# Patient Record
Sex: Male | Born: 1955
Health system: Southern US, Community
[De-identification: ages and names within clinical notes are randomized; demographics above are authoritative.]

## PROBLEM LIST (undated history)

## (undated) DIAGNOSIS — I10 Essential (primary) hypertension: Secondary | ICD-10-CM

## (undated) DIAGNOSIS — R911 Solitary pulmonary nodule: Secondary | ICD-10-CM

## (undated) DIAGNOSIS — F329 Major depressive disorder, single episode, unspecified: Secondary | ICD-10-CM

## (undated) DIAGNOSIS — E785 Hyperlipidemia, unspecified: Secondary | ICD-10-CM

## (undated) DIAGNOSIS — F32A Depression, unspecified: Secondary | ICD-10-CM

## (undated) DIAGNOSIS — Z8719 Personal history of other diseases of the digestive system: Secondary | ICD-10-CM

## (undated) DIAGNOSIS — J449 Chronic obstructive pulmonary disease, unspecified: Secondary | ICD-10-CM

## (undated) DIAGNOSIS — M1990 Unspecified osteoarthritis, unspecified site: Secondary | ICD-10-CM

## (undated) DIAGNOSIS — Z8709 Personal history of other diseases of the respiratory system: Secondary | ICD-10-CM

## (undated) DIAGNOSIS — IMO0001 Reserved for inherently not codable concepts without codable children: Secondary | ICD-10-CM

## (undated) HISTORY — PX: LEG SURGERY: SHX1003

## (undated) HISTORY — PX: COLONOSCOPY: SHX174

## (undated) HISTORY — DX: Solitary pulmonary nodule: R91.1

## (undated) HISTORY — DX: Chronic obstructive pulmonary disease, unspecified: J44.9

## (undated) HISTORY — DX: Hyperlipidemia, unspecified: E78.5

## (undated) HISTORY — PX: TOOTH EXTRACTION: SUR596

## (undated) HISTORY — DX: Essential (primary) hypertension: I10

---

## 2006-01-02 ENCOUNTER — Emergency Department (HOSPITAL_COMMUNITY): Admission: EM | Admit: 2006-01-02 | Discharge: 2006-01-03 | Payer: Self-pay | Admitting: Emergency Medicine

## 2013-09-11 ENCOUNTER — Encounter (HOSPITAL_COMMUNITY): Payer: Self-pay | Admitting: Emergency Medicine

## 2013-09-11 ENCOUNTER — Emergency Department (HOSPITAL_COMMUNITY)
Admission: EM | Admit: 2013-09-11 | Discharge: 2013-09-11 | Disposition: A | Discharge status: Home / Self Care | Payer: Medicare HMO | Attending: Emergency Medicine | Admitting: Emergency Medicine

## 2013-09-11 DIAGNOSIS — K625 Hemorrhage of anus and rectum: Secondary | ICD-10-CM | POA: Insufficient documentation

## 2013-09-11 DIAGNOSIS — I10 Essential (primary) hypertension: Secondary | ICD-10-CM | POA: Insufficient documentation

## 2013-09-11 DIAGNOSIS — K5900 Constipation, unspecified: Secondary | ICD-10-CM | POA: Insufficient documentation

## 2013-09-11 LAB — COMPREHENSIVE METABOLIC PANEL
ALT: 17 U/L (ref 0–53)
AST: 20 U/L (ref 0–37)
Albumin: 3.5 g/dL (ref 3.5–5.2)
Alkaline Phosphatase: 105 U/L (ref 39–117)
BUN: 19 mg/dL (ref 6–23)
CO2: 28 mEq/L (ref 19–32)
Calcium: 9 mg/dL (ref 8.4–10.5)
Chloride: 97 mEq/L (ref 96–112)
Creatinine, Ser: 0.81 mg/dL (ref 0.50–1.35)
GFR calc Af Amer: >90 mL/min (ref >90)
GFR calc non Af Amer: >90 mL/min (ref >90)
Glucose, Bld: 115 mg/dL — ABNORMAL HIGH (ref 70–99)
Potassium: 4.4 mEq/L (ref 3.7–5.3)
Sodium: 135 mEq/L — ABNORMAL LOW (ref 137–147)
Total Bilirubin: 0.2 mg/dL — ABNORMAL LOW (ref 0.3–1.2)
Total Protein: 6.9 g/dL (ref 6.0–8.3)

## 2013-09-11 LAB — CBC WITH DIFFERENTIAL/PLATELET
Basophils Absolute: 0 10*3/uL (ref 0.0–0.1)
Basophils Relative: 0 % (ref 0–1)
Eosinophils Absolute: 0.4 10*3/uL (ref 0.0–0.7)
Eosinophils Relative: 3 % (ref 0–5)
HCT: 41.3 % (ref 39.0–52.0)
Hemoglobin: 14.4 g/dL (ref 13.0–17.0)
Lymphocytes Relative: 19 % (ref 12–46)
Lymphs Abs: 2.2 10*3/uL (ref 0.7–4.0)
MCH: 32.4 pg (ref 26.0–34.0)
MCHC: 34.9 g/dL (ref 30.0–36.0)
MCV: 92.8 fL (ref 78.0–100.0)
Monocytes Absolute: 1 10*3/uL (ref 0.1–1.0)
Monocytes Relative: 9 % (ref 3–12)
Neutro Abs: 7.8 10*3/uL — ABNORMAL HIGH (ref 1.7–7.7)
Neutrophils Relative %: 69 % (ref 43–77)
Platelets: 332 10*3/uL (ref 150–400)
RBC: 4.45 MIL/uL (ref 4.22–5.81)
RDW: 12.3 % (ref 11.5–15.5)
WBC: 11.3 10*3/uL — ABNORMAL HIGH (ref 4.0–10.5)

## 2013-09-11 MED ORDER — PANTOPRAZOLE SODIUM 40 MG PO TBEC
40.0000 mg | DELAYED_RELEASE_TABLET | Freq: Once | ORAL | Status: AC
  Administered 2013-09-11: 40 mg via ORAL
  Filled 2013-09-11: qty 1

## 2013-09-11 MED ORDER — PANTOPRAZOLE SODIUM 40 MG PO TBEC: 40.0000 mg | DELAYED_RELEASE_TABLET | Freq: Every day | ORAL | Status: AC

## 2013-09-11 MED ORDER — SODIUM CHLORIDE 0.9 % IV BOLUS (SEPSIS)
1000.0000 mL | Freq: Once | INTRAVENOUS | Status: AC
  Administered 2013-09-11: 1000 mL via INTRAVENOUS

## 2013-09-11 NOTE — Discharge Instructions (Signed)
Stop your aleve and aspirin and follow up with Dr. Oneida Alar in 1-2 weeks

## 2013-09-11 NOTE — ED Notes (Signed)
Pt reports "knots in my stomach" x1 week and intermittent diarrhea. Pt states he's had "some dark stools" since Tuesday. Pt denies weakness, lightheadedness.

## 2013-09-11 NOTE — ED Provider Notes (Signed)
CSN: [id number]     Arrival date & time 09/11/13  1705 History  This chart was scribed for Maudry Diego, MD by Randa Evens, ED Scribe. This patient was seen in room APA11/APA11 and the patient's care was started at 6:04 PM.    Chief Complaint  Patient presents with  . Rectal Bleeding    Patient is a 58 y.o. male presenting with hematochezia. The history is provided by the patient. No language interpreter was used.  Rectal Bleeding Quality:  Black and tarry Amount:  Unable to specify Duration:  1 week Timing:  Intermittent Progression:  Worsening Chronicity:  New Context: constipation and hemorrhoids   Similar prior episodes: no   Relieved by:  None tried Worsened by:  Nothing tried Ineffective treatments:  None tried Associated symptoms: no abdominal pain    HPI Comments: John Bridges is a 58 y.o. male who presents to the Emergency Department complaining of blood in stool since last Friday. Pt complains of associated abdominal pain and constipation throughout the weekend. Pt states when he was able to have bowel movement his stool was black and tarry. Pt also complains his stomach being "harder than normal".  Pt also takes an apirin per day. Pt also denies any other symptoms or pain. He states that he has a prior history of hemorrhoids.    Past Medical History  Diagnosis Date  . Hypertension    History reviewed. No pertinent past surgical history. No family history on file. History  Substance Use Topics  . Smoking status: Not on file  . Smokeless tobacco: Not on file  . Alcohol Use: Not on file    Review of Systems  Constitutional: Negative for appetite change and fatigue.  HENT: Negative for congestion, ear discharge and sinus pressure.   Eyes: Negative for discharge.  Respiratory: Negative for cough.   Cardiovascular: Negative for chest pain.  Gastrointestinal: Positive for constipation, blood in stool and hematochezia. Negative for abdominal pain and diarrhea.   Genitourinary: Negative for frequency and hematuria.  Musculoskeletal: Negative for back pain.  Skin: Negative for rash.  Neurological: Negative for seizures and headaches.  Psychiatric/Behavioral: Negative for hallucinations.    Allergies  Review of patient's allergies indicates no known allergies.  Home Medications   Prior to Admission medications   Not on File   Triage Vitals: BP 156/88  Pulse 94  Temp(Src) 98 F (36.7 C) (Oral)  Resp 20  Ht 5\' 9"  (1.753 m)  Wt 190 lb (86.183 kg)  BMI 28.05 kg/m2  SpO2 96%  Physical Exam  Nursing note and vitals reviewed. Constitutional: He is oriented to person, place, and time. He appears well-developed.  HENT:  Head: Normocephalic.  Eyes: Conjunctivae and EOM are normal. No scleral icterus.  Neck: Neck supple. No thyromegaly present.  Cardiovascular: Normal rate and regular rhythm.  Exam reveals no gallop and no friction rub.   No murmur heard. Pulmonary/Chest: No stridor. He has no wheezes. He has no rales. He exhibits no tenderness.  Abdominal: He exhibits no distension. There is no tenderness. There is no rebound.  Genitourinary:  Dark brown heme positive stool  Musculoskeletal: Normal range of motion. He exhibits no edema.  Lymphadenopathy:    He has no cervical adenopathy.  Neurological: He is oriented to person, place, and time. He exhibits normal muscle tone. Coordination normal.  Skin: No rash noted. No erythema.  Psychiatric: He has a normal mood and affect. His behavior is normal.    ED Course  Procedures (including critical care time) DIAGNOSTIC STUDIES: Oxygen Saturation is 96% on RA, adequate by my interpretation.    COORDINATION OF CARE: 6:08 PM-Discussed treatment plan which includes CBC panel, CMP, medications and rectal exam with pt at bedside and pt agreed to plan.   6:20 PM- Performed rectal exam.  Labs Review Labs Reviewed  CBC WITH DIFFERENTIAL - Abnormal; Notable for the following:    WBC 11.3  (*)    Neutro Abs 7.8 (*)    All other components within normal limits  COMPREHENSIVE METABOLIC PANEL - Abnormal; Notable for the following:    Sodium 135 (*)    Glucose, Bld 115 (*)    Total Bilirubin 0.2 (*)    All other components within normal limits    Imaging Review No results found.   EKG Interpretation None      MDM   Final diagnoses:  None   The chart was scribed for me under my direct supervision.  I personally performed the history, physical, and medical decision making and all procedures in the evaluation of this patient.Maudry Diego, MD 09/11/13 [phone]

## 2013-09-11 NOTE — ED Notes (Signed)
Pt denies dizziness or lightheadedness when standing for orthostatic VS.

## 2013-09-11 NOTE — ED Notes (Signed)
States he noted bright red blood noted this afternoon

## 2014-12-27 ENCOUNTER — Encounter (HOSPITAL_COMMUNITY): Payer: Self-pay | Admitting: *Deleted

## 2014-12-27 ENCOUNTER — Emergency Department (HOSPITAL_COMMUNITY)
Admission: EM | Admit: 2014-12-27 | Discharge: 2014-12-27 | Disposition: A | Discharge status: Home / Self Care | Payer: Medicare HMO | Attending: Emergency Medicine | Admitting: Emergency Medicine

## 2014-12-27 DIAGNOSIS — Z720 Tobacco use: Secondary | ICD-10-CM | POA: Diagnosis not present

## 2014-12-27 DIAGNOSIS — Z791 Long term (current) use of non-steroidal anti-inflammatories (NSAID): Secondary | ICD-10-CM | POA: Diagnosis not present

## 2014-12-27 DIAGNOSIS — R2241 Localized swelling, mass and lump, right lower limb: Secondary | ICD-10-CM | POA: Diagnosis present

## 2014-12-27 DIAGNOSIS — Z79899 Other long term (current) drug therapy: Secondary | ICD-10-CM | POA: Diagnosis not present

## 2014-12-27 DIAGNOSIS — L089 Local infection of the skin and subcutaneous tissue, unspecified: Secondary | ICD-10-CM

## 2014-12-27 DIAGNOSIS — I10 Essential (primary) hypertension: Secondary | ICD-10-CM | POA: Diagnosis not present

## 2014-12-27 DIAGNOSIS — Z7982 Long term (current) use of aspirin: Secondary | ICD-10-CM | POA: Diagnosis not present

## 2014-12-27 MED ORDER — SULFAMETHOXAZOLE-TRIMETHOPRIM 800-160 MG PO TABS: 1.0000 | ORAL_TABLET | Freq: Two times a day (BID) | ORAL | Status: AC

## 2014-12-27 NOTE — ED Notes (Signed)
Pt reports taking a cholesterol medication, does not know the name.

## 2014-12-27 NOTE — ED Notes (Signed)
Pt seen and evaluated by EDPa for initial assessment. 

## 2014-12-27 NOTE — ED Provider Notes (Signed)
CSN: [id number]     Arrival date & time 12/27/14  1351 History   First MD Initiated Contact with Patient 12/27/14 1401     Chief Complaint  Patient presents with  . Cyst     (Consider location/radiation/quality/duration/timing/severity/associated sxs/prior Treatment) HPI Comments: Pt comes in with c/o knot on his right leg for 1 week. States that the area is sore to touch. He has had a little bit of bloody drainage. He has noticed some redness around the area. He states that someone told him it might be cancer so he decided to come in today. No fever, numbness or weakness  The history is provided by the patient. No language interpreter was used.    Past Medical History  Diagnosis Date  . Hypertension    History reviewed. No pertinent past surgical history. History reviewed. No pertinent family history. History  Substance Use Topics  . Smoking status: Current Every Day Smoker -- 1.00 packs/day    Types: Cigarettes  . Smokeless tobacco: Not on file  . Alcohol Use: No    Review of Systems  All other systems reviewed and are negative.     Allergies  Review of patient's allergies indicates no known allergies.  Home Medications   Prior to Admission medications   Medication Sig Start Date End Date Taking? Authorizing Provider  amLODipine (NORVASC) 10 MG tablet Take 10 mg by mouth daily.    Historical Provider, MD  aspirin EC 81 MG tablet Take 81 mg by mouth daily.    Historical Provider, MD  HYDROcodone-acetaminophen (NORCO) 7.5-325 MG per tablet Take 1 tablet by mouth every 6 (six) hours as needed for moderate pain.    Historical Provider, MD  lisinopril-hydrochlorothiazide (PRINZIDE,ZESTORETIC) 20-12.5 MG per tablet Take 1 tablet by mouth daily.    Historical Provider, MD  naproxen sodium (ALEVE) 220 MG tablet Take 440 mg by mouth daily.    Historical Provider, MD  pantoprazole (PROTONIX) 40 MG tablet Take 1 tablet (40 mg total) by mouth daily. 09/11/13   Milton Ferguson, MD   Pseudoeph-Doxylamine-DM-APAP (NYQUIL PO) Take 15 mLs by mouth at bedtime as needed (Sleep).    Historical Provider, MD   BP 150/87 mmHg  Pulse 97  Temp(Src) 98.6 F (37 C) (Oral)  Resp 16  Ht 5\' 9"  (1.753 m)  Wt 175 lb (79.379 kg)  BMI 25.83 kg/m2  SpO2 98% Physical Exam  Constitutional: He is oriented to person, place, and time. He appears well-developed and well-nourished.  Cardiovascular: Normal rate and regular rhythm.   Pulmonary/Chest: Effort normal and breath sounds normal.  Musculoskeletal: Normal range of motion.  Neurological: He is alert and oriented to person, place, and time.  Skin:  Small red fluctuant area to the right lower leg. Scab noted to the center. No drainage noted a this time  Nursing note and vitals reviewed.   ED Course  Procedures (including critical care time) Labs Review Labs Reviewed - No data to display  Imaging Review No results found.   EKG Interpretation None      MDM   Final diagnoses:  Skin infection    Consistent with infected sebaceous cyst. Pt has follow up with his doctor in the next couple of day. Sent home on bactrim.     Glendell Docker, NP 12/27/14 [place], MD 12/29/14 1252

## 2014-12-27 NOTE — ED Notes (Signed)
Patient reports "knot on leg" x 1 week. Denies injury, denies drainage or fevers.

## 2015-07-06 ENCOUNTER — Telehealth: Payer: Self-pay | Admitting: *Deleted

## 2015-07-06 MED ORDER — HYDROCODONE-ACETAMINOPHEN 7.5-325 MG PO TABS: 1.0000 | ORAL_TABLET | ORAL | Status: DC | PRN

## 2015-07-06 NOTE — Telephone Encounter (Signed)
Patient called requesting hydrocodone 7.5 qty 120 to be refilled. Please advise [phone]

## 2015-07-06 NOTE — Telephone Encounter (Signed)
Prescription available, patient aware  

## 2015-07-06 NOTE — Telephone Encounter (Signed)
Rx printed

## 2015-07-17 ENCOUNTER — Encounter: Payer: Self-pay | Admitting: Internal Medicine

## 2015-07-17 ENCOUNTER — Ambulatory Visit (INDEPENDENT_AMBULATORY_CARE_PROVIDER_SITE_OTHER): Payer: Medicare HMO | Admitting: Internal Medicine

## 2015-07-17 VITALS — BP 140/80 | HR 98 | Ht 68.0 in | Wt 181.0 lb

## 2015-07-17 DIAGNOSIS — Z23 Encounter for immunization: Secondary | ICD-10-CM | POA: Diagnosis not present

## 2015-07-17 DIAGNOSIS — Z129 Encounter for screening for malignant neoplasm, site unspecified: Secondary | ICD-10-CM | POA: Insufficient documentation

## 2015-07-17 DIAGNOSIS — F17210 Nicotine dependence, cigarettes, uncomplicated: Secondary | ICD-10-CM | POA: Diagnosis not present

## 2015-07-17 NOTE — Progress Notes (Signed)
Subjective:    Patient ID: John Bridges, male    DOB: Sep 18, 1955, 60 y.o.   MRN: [id number]  PCP Antionette Fairy, PA-C   HPI  IOV 07/17/2015  Chief Complaint  Patient presents with  . PULMONARY CONSULT    Bronchitis - self referral. Notes improvement - some SOB at times     60 year old male who is the husband of my patient with lupus and pulmonary fibrosis Serita Kyle. He presents for new consult. He is a heavy smoker. One of his brothers has had lung cancer. One of his brother has had coronary artery disease and is having bypass today in [place]. He is disabled from back issues but takes care of his wife. He says that at baseline he does not have any physical problems including shortness of breath or cough or wheezing or chest tightness. But in October 2016 had a viral illness respiratory and after that was significantly short of breath and required prednisone and antibiotics. After which he slowly improved and is back to baseline. He consistently denies having any active symptoms. His wife is significantly concerned and therefore referred him here. He keeps insisting that he is feeling fine.  He is noticed to be on lisinopril.  There is very little outside information available   has a past medical history of Hypertension and Hyperlipidemia.   reports that he has been smoking Cigarettes.  He has a 40 pack-year smoking history. He does not have any smokeless tobacco history on file.  Past Surgical History  Procedure Laterality Date  . No past surgeries      No Known Allergies  There is no immunization history for the selected administration types on file for this patient.  Family History  Problem Relation Age of Onset  . Hypertension Mother   . Hypertension Father   . Cancer Brother      Current outpatient prescriptions:  .  amLODipine (NORVASC) 10 MG tablet, Take 10 mg by mouth daily., Disp: , Rfl:  .  HYDROcodone-acetaminophen (NORCO) 7.5-325 MG  tablet, Take 1 tablet by mouth every 4 (four) hours as needed for moderate pain (Must last 30 days.  Do not drive or operate machinery while taking this medicine.)., Disp: 120 tablet, Rfl: 0 .  lisinopril-hydrochlorothiazide (PRINZIDE,ZESTORETIC) 20-12.5 MG per tablet, Take 1 tablet by mouth daily., Disp: , Rfl:  .  naproxen sodium (ALEVE) 220 MG tablet, Take 440 mg by mouth daily., Disp: , Rfl:  .  pantoprazole (PROTONIX) 40 MG tablet, Take 1 tablet (40 mg total) by mouth daily., Disp: 30 tablet, Rfl: 0 .  pravastatin (PRAVACHOL) 40 MG tablet, Take 40 mg by mouth daily., Disp: , Rfl:  .  tiZANidine (ZANAFLEX) 4 MG tablet, Take 4 mg by mouth every 6 (six) hours as needed for muscle spasms., Disp: , Rfl:      Review of Systems  Constitutional: Negative for fever and unexpected weight change.  HENT: Negative for congestion, dental problem, ear pain, nosebleeds, postnasal drip, rhinorrhea, sinus pressure, sneezing, sore throat and trouble swallowing.   Eyes: Negative for redness and itching.  Respiratory: Positive for shortness of breath. Negative for cough, chest tightness and wheezing.   Cardiovascular: Negative for palpitations and leg swelling.  Gastrointestinal: Negative for nausea and vomiting.  Genitourinary: Negative for dysuria.  Musculoskeletal: Negative for joint swelling.  Skin: Negative for rash.  Neurological: Negative for headaches.  Hematological: Does not bruise/bleed easily.  Psychiatric/Behavioral: Negative for dysphoric mood. The patient is not nervous/anxious.  Objective:   Physical Exam  Constitutional: He is oriented to person, place, and time. He appears well-developed and well-nourished. No distress.  HENT:  Head: Normocephalic and atraumatic.  Right Ear: External ear normal.  Left Ear: External ear normal.  Mouth/Throat: Oropharynx is clear and moist. No oropharyngeal exudate.  Eyes: Conjunctivae and EOM are normal. Pupils are equal, round, and reactive  to light. Right eye exhibits no discharge. Left eye exhibits no discharge. No scleral icterus.  Neck: Normal range of motion. Neck supple. No JVD present. No tracheal deviation present. No thyromegaly present.  Cardiovascular: Normal rate, regular rhythm and intact distal pulses.  Exam reveals no gallop and no friction rub.   No murmur heard. Pulmonary/Chest: Effort normal and breath sounds normal. No respiratory distress. He has no wheezes. He has no rales. He exhibits no tenderness.  Prominent xiphoid process  Abdominal: Soft. Bowel sounds are normal. He exhibits no distension and no mass. There is no tenderness. There is no rebound and no guarding.  Musculoskeletal: Normal range of motion. He exhibits no edema or tenderness.  Lymphadenopathy:    He has no cervical adenopathy.  Neurological: He is alert and oriented to person, place, and time. He has normal reflexes. No cranial nerve deficit. Coordination normal.  Skin: Skin is warm and dry. No rash noted. He is not diaphoretic. No erythema. No pallor.  Prominent hyperemia on the face and upper chest  Psychiatric: He has a normal mood and affect. His behavior is normal. Judgment and thought content normal.  Nursing note and vitals reviewed.   Filed Vitals:   07/17/15 1659  BP: 140/80  Pulse: 98  Height: 5\' 8"  (1.727 m)  Weight: 181 lb (82.101 kg)  SpO2: 96%         Assessment & Plan:     ICD-9-CM ICD-10-CM   1. Smoking greater than 40 pack years 305.1 F17.210 CT CHEST LUNG CA SCREEN LOW DOSE W/O CM     Pulmonary Function Test  2. Need for prophylactic vaccination and inoculation against influenza V04.81 Z23 Flu Vaccine QUAD 36+ mos IM (Fluarix, Quad PF)     CT CHEST LUNG CA SCREEN LOW DOSE W/O CM     Pulmonary Function Test  3. Cancer screening V76.9 Z12.9 CT CHEST LUNG CA SCREEN LOW DOSE W/O CM     Pulmonary Function Test    #lung cancer screening I discussed screening Ct chest for early detection of lung  cancer Explained that in age 97-75 and smoking history, annual low dose CT chest can pick up lung cancer early and has potential to save lives and cure lung cancer This is similar in concept to screening mammogram, colonoscopies and pap smears I explained Ct scan is low dose radiation I explained early lung cancer asymptomatic and only way to  detect is CT  With the real advantage that early lung cancer is curable through radiation or surgery I explained CT superior to CXR I explained that false positives are present and can incur cost and workup like biopsies, additional scan but benefit outweighs risk I recommend one a year   #resp  - Currently asymptomatic but given what sounds like asthma COPD exacerbation and smoking history we will do full pulmonary function test  He is to return for follow-up to review the results of these tests in the next few weeks    Dr. Brand Males, M.D., [facility].C.P Pulmonary and Critical Care Medicine Staff Physician [place] [facility] Pager:  [phone], If no answer or between  15:00h - 7:00h: call [phone]  07/17/2015 5:31 PM

## 2015-07-17 NOTE — Patient Instructions (Signed)
ICD-9-CM ICD-10-CM   1. Smoking greater than 40 pack years 305.1 F17.210   2. Need for prophylactic vaccination and inoculation against influenza V04.81 Z23 Flu Vaccine QUAD 36+ mos IM (Fluarix, Quad PF)  3. Cancer screening V76.9 Z12.9     Do Low dose CT chest wo contrast  Do full PFT   - do both tests at [facility]  Followup  - return to see NP Sarah or Tammy in few weeks to discuss above results

## 2015-07-18 ENCOUNTER — Ambulatory Visit (INDEPENDENT_AMBULATORY_CARE_PROVIDER_SITE_OTHER): Payer: Medicare HMO | Admitting: Orthopaedic Surgery

## 2015-07-18 VITALS — BP 170/92 | HR 87 | Temp 98.2°F | Ht 68.0 in | Wt 180.0 lb

## 2015-07-18 DIAGNOSIS — M25511 Pain in right shoulder: Secondary | ICD-10-CM

## 2015-07-18 NOTE — Progress Notes (Signed)
Patient John Bridges John Bridges, male DOB:02-01-1956, 60 y.o. [id number]  Chief Complaint  Patient presents with  . Follow-up    Right shoulder "feeling a little better just sore"    HPI  CLETIS STEVEN is a 60 y.o. male who has chronic right shoulder pain.  He has no new acute problem, no swelling, no paresthesias.  Shoulder Pain  The pain is present in the right shoulder. This is a chronic problem. The current episode started more than 1 year ago. There has been no history of extremity trauma. The problem occurs intermittently. The problem has been waxing and waning. The quality of the pain is described as aching. The pain is at a severity of 2/10. The pain is mild. He has tried acetaminophen, NSAIDS, oral narcotics and rest for the symptoms. The treatment provided moderate relief.    Body mass index is 27.38 kg/(m^2).   Review of Systems  Constitutional:       Patient does not have Diabetes Mellitus. Patient has hypertension. Patient has COPD or shortness of breath. Patient does not have BMI > 35. Patient has current smoking history.  Musculoskeletal: Positive for myalgias and arthralgias.    Past Medical History  Diagnosis Date  . Hypertension   . Hyperlipidemia     Past Surgical History  Procedure Laterality Date  . No past surgeries      Family History  Problem Relation Age of Onset  . Hypertension Mother   . Hypertension Father   . Cancer Brother     Social History Social History  Substance Use Topics  . Smoking status: Current Every Day Smoker -- 1.00 packs/day for 40 years    Types: Cigarettes  . Smokeless tobacco: Not on file  . Alcohol Use: 0.0 oz/week    0 Standard drinks or equivalent per week     Comment: occasional beer    No Known Allergies  Current Outpatient Prescriptions  Medication Sig Dispense Refill  . amLODipine (NORVASC) 10 MG tablet Take 10 mg by mouth daily.    Marland Kitchen HYDROcodone-acetaminophen (NORCO) 7.5-325 MG tablet Take 1 tablet by  mouth every 4 (four) hours as needed for moderate pain (Must last 30 days.  Do not drive or operate machinery while taking this medicine.). 120 tablet 0  . lisinopril-hydrochlorothiazide (PRINZIDE,ZESTORETIC) 20-12.5 MG per tablet Take 1 tablet by mouth daily.    . naproxen sodium (ALEVE) 220 MG tablet Take 440 mg by mouth daily.    . pantoprazole (PROTONIX) 40 MG tablet Take 1 tablet (40 mg total) by mouth daily. 30 tablet 0  . pravastatin (PRAVACHOL) 40 MG tablet Take 40 mg by mouth daily.    Marland Kitchen tiZANidine (ZANAFLEX) 4 MG tablet Take 4 mg by mouth every 6 (six) hours as needed for muscle spasms.     No current facility-administered medications for this visit.     Physical Exam  Blood pressure 170/92, pulse 87, temperature 98.2 F (36.8 C), height 5\' 8"  (1.727 m), weight 180 lb (81.647 kg).  Constitutional: overall normal hygiene, normal nutrition, well developed, normal grooming, normal body habitus. Assistive device:none  Musculoskeletal: gait and station Limp none, muscle tone and strength are normal, no tremors or atrophy is present.  .  Neurological: coordination overall normal.  Deep tendon reflex/nerve stretch intact.  Sensation normal.  Cranial nerves II-XII intact.   Skin:   normal overall no scars, lesions, ulcers or rashes. No psoriasis.  Psychiatric: Alert and oriented x 3.  Recent memory intact, remote memory unclear.  Normal mood and affect. Well groomed.  Good eye contact.  Cardiovascular: overall no swelling, no varicosities, no edema bilaterally, normal temperatures of the legs and arms, no clubbing, cyanosis and good capillary refill.  Lymphatic: palpation is normal.  Examination of right Upper Extremity is done.  Inspection:   Overall:  Elbow non-tender without crepitus or defects, forearm non-tender without crepitus or defects, wrist non-tender without crepitus or defects, hand non-tender.    Shoulder: with glenohumeral joint tenderness, without  effusion.   Upper arm: without swelling and tenderness   Range of motion:   Overall:  Full range of motion of the elbow, full range of motion of wrist and full range of motion in fingers.   Shoulder:  right  160 degrees forward flexion; 140 degrees abduction; 30 degrees internal rotation, 30 degrees external rotation, 20 degrees extension, 40 degrees adduction.   Stability:   Overall:  Shoulder, elbow and wrist stable   Strength and Tone:   Overall full shoulder muscles strength, full upper arm strength and normal upper arm bulk and tone.   The patient has been educated about the nature of the problem(s) and counseled on treatment options.  The patient appeared to understand what I have discussed and is in agreement with it.  PLAN Call if any problems.  Precautions discussed.  Continue current medications.   Return to clinic 3 months

## 2015-08-02 ENCOUNTER — Ambulatory Visit (HOSPITAL_COMMUNITY)
Admission: RE | Admit: 2015-08-02 | Discharge: 2015-08-02 | Disposition: A | Discharge status: Home / Self Care | Payer: Medicare HMO | Source: Ambulatory Visit | Attending: Internal Medicine | Admitting: Internal Medicine

## 2015-08-02 ENCOUNTER — Telehealth: Payer: Self-pay | Admitting: Internal Medicine

## 2015-08-02 ENCOUNTER — Telehealth: Payer: Self-pay | Admitting: Orthopaedic Surgery

## 2015-08-02 ENCOUNTER — Other Ambulatory Visit: Payer: Self-pay | Admitting: *Deleted

## 2015-08-02 DIAGNOSIS — F17210 Nicotine dependence, cigarettes, uncomplicated: Secondary | ICD-10-CM

## 2015-08-02 DIAGNOSIS — I700 Atherosclerosis of aorta: Secondary | ICD-10-CM | POA: Insufficient documentation

## 2015-08-02 DIAGNOSIS — Z129 Encounter for screening for malignant neoplasm, site unspecified: Secondary | ICD-10-CM

## 2015-08-02 DIAGNOSIS — I2510 Atherosclerotic heart disease of native coronary artery without angina pectoris: Secondary | ICD-10-CM | POA: Diagnosis not present

## 2015-08-02 DIAGNOSIS — J439 Emphysema, unspecified: Secondary | ICD-10-CM | POA: Insufficient documentation

## 2015-08-02 DIAGNOSIS — Z23 Encounter for immunization: Secondary | ICD-10-CM

## 2015-08-02 DIAGNOSIS — R911 Solitary pulmonary nodule: Secondary | ICD-10-CM

## 2015-08-02 DIAGNOSIS — R918 Other nonspecific abnormal finding of lung field: Secondary | ICD-10-CM | POA: Insufficient documentation

## 2015-08-02 LAB — PULMONARY FUNCTION TEST
DL/VA % pred: 84 %
DL/VA: 3.82 ml/min/mmHg/L
DLCO unc % pred: 77 %
DLCO unc: 22.86 ml/min/mmHg
FEF 25-75 Post: 1.15 L/sec
FEF 25-75 Pre: 0.92 L/sec
FEF2575-%Change-Post: 24 %
FEF2575-%Pred-Post: 40 %
FEF2575-%Pred-Pre: 32 %
FEV1-%Change-Post: 10 %
FEV1-%Pred-Post: 64 %
FEV1-%Pred-Pre: 58 %
FEV1-Post: 2.19 L
FEV1-Pre: 1.99 L
FEV1FVC-%Change-Post: 8 %
FEV1FVC-%Pred-Pre: 78 %
FEV6-%Change-Post: 1 %
FEV6-%Pred-Post: 78 %
FEV6-%Pred-Pre: 77 %
FEV6-Post: 3.34 L
FEV6-Pre: 3.3 L
FEV6FVC-%Change-Post: 0 %
FEV6FVC-%Pred-Post: 103 %
FEV6FVC-%Pred-Pre: 103 %
FVC-%Change-Post: 2 %
FVC-%Pred-Post: 76 %
FVC-%Pred-Pre: 75 %
FVC-Post: 3.42 L
FVC-Pre: 3.35 L
Post FEV1/FVC ratio: 64 %
Post FEV6/FVC ratio: 99 %
Pre FEV1/FVC ratio: 59 %
Pre FEV6/FVC Ratio: 98 %
RV % pred: 194 %
RV: 4.1 L
TLC % pred: 110 %
TLC: 7.29 L

## 2015-08-02 MED ORDER — HYDROCODONE-ACETAMINOPHEN 7.5-325 MG PO TABS: 1.0000 | ORAL_TABLET | ORAL | Status: DC | PRN

## 2015-08-02 MED ORDER — ALBUTEROL SULFATE (2.5 MG/3ML) 0.083% IN NEBU
2.5000 mg | INHALATION_SOLUTION | Freq: Once | RESPIRATORY_TRACT | Status: AC
  Administered 2015-08-02: 2.5 mg via RESPIRATORY_TRACT

## 2015-08-02 NOTE — Telephone Encounter (Addendum)
LEt [place] Mote know that pft shows moderate copd.   Also CT today shows nodule 13.31mm - plest get PET scan    If he can do PET befoe he sees APP would be helpful  Changing note from [facility] to Triage  THanks  Dr. Brand Males, M.D., [facility]C.P Pulmonary and Critical Care Medicine Staff Physician [place] [facility] Pager: [phone], If no answer or between  15:00h - 7:00h: call [phone]  08/03/2015 12:27 PM     Ct Chest Lung Ca Screen Low Dose W/o Cm  08/03/2015  CLINICAL DATA:  60 year old male current smoker with 45 pack-year history of smoking, for initial lung cancer screening EXAM: CT CHEST WITHOUT CONTRAST LOW-DOSE FOR LUNG CANCER SCREENING TECHNIQUE: Multidetector CT imaging of the chest was performed following the standard protocol without IV contrast. COMPARISON:  None. FINDINGS: Mediastinum/Nodes: Heart is normal in size. No pericardial effusion. Coronary atherosclerosis involving the LAD and right coronary artery. Atherosclerotic calcifications of the aortic arch. Small mediastinal lymph nodes which do not meet pathologic CT size criteria, including a 7 mm short axis low right paratracheal node (series 2/ image 26) and a 7 mm short axis subcarinal node (series 2/image 30). While prominent nodal soft tissue is suspected in the right hilar region (series 2/ image 32), this is poorly evaluated in the absence of intravenous contrast. Visualized thyroid is unremarkable. Lungs/Pleura: Irregular nodular opacity in the posterior right upper lobe (series 3/image 60), volumetric mean approximately 13.8 mm, with extension to the pleural surface (series 3/image 60). While unusual apical pleural-parenchymal scarring is possible, neoplasm is not excluded. By size criteria, this reflects a Lung Rads 4A lesion. The lungs are otherwise clear. Mild centrilobular and paraseptal emphysematous changes, upper lobe predominant. No focal consolidation. No  pleural effusion or pneumothorax. Upper abdomen: Visualized upper abdomen is unremarkable. Musculoskeletal: Visualized osseous structures are within normal limits. IMPRESSION: Irregular nodular opacity in the posterior right upper lobe, volumetric mean approximately 13.8 mm, with extension to the pleural surface. While unusual apical pleural-parenchymal scarring is possible, neoplasm is not excluded. By size criteria, this reflects a Lung Rads 4A lesion. Lung-RADS Category 4A, suspicious. Follow up low-dose chest CT without contrast in 3 months (please use the following order, "CT CHEST LCS NODULE FOLLOW-UP W/O CM") is recommended. Alternatively, PET may be considered. Electronically Signed   By: Julian Hy M.D.   On: 08/03/2015 11:29

## 2015-08-02 NOTE — Telephone Encounter (Signed)
Patient requesting refill of Hydrocodone  7.5/325mg   Qty 120 Tablets  1 Tablet every 4 hours as needed for moderate pain

## 2015-08-03 NOTE — Telephone Encounter (Signed)
Called and spoke with pt's wife. Reviewed results and recs. She voiced understanding and had no further questions. PET scan order placed. Nothing further needed.

## 2015-08-14 ENCOUNTER — Ambulatory Visit (HOSPITAL_COMMUNITY)
Admission: RE | Admit: 2015-08-14 | Discharge: 2015-08-14 | Disposition: A | Discharge status: Home / Self Care | Payer: Medicare HMO | Source: Ambulatory Visit | Attending: Internal Medicine | Admitting: Internal Medicine

## 2015-08-14 ENCOUNTER — Telehealth: Payer: Self-pay | Admitting: Internal Medicine

## 2015-08-14 DIAGNOSIS — J439 Emphysema, unspecified: Secondary | ICD-10-CM | POA: Insufficient documentation

## 2015-08-14 DIAGNOSIS — R911 Solitary pulmonary nodule: Secondary | ICD-10-CM

## 2015-08-14 DIAGNOSIS — R918 Other nonspecific abnormal finding of lung field: Secondary | ICD-10-CM | POA: Insufficient documentation

## 2015-08-14 LAB — GLUCOSE, CAPILLARY: Glucose-Capillary: 116 mg/dL — ABNORMAL HIGH (ref 65–99)

## 2015-08-14 MED ORDER — FLUDEOXYGLUCOSE F - 18 (FDG) INJECTION
8.9900 | Freq: Once | INTRAVENOUS | Status: AC | PRN
  Administered 2015-08-14: 8.99 via INTRAVENOUS

## 2015-08-14 NOTE — Telephone Encounter (Signed)
Spoke with pt's wife. Pt has been scheduled to see TP on 08/18/15 at 3:45pm. She wanted to schedule on this day because she has an appointment that day as well.

## 2015-08-14 NOTE — Telephone Encounter (Signed)
\   Called wife Ulysses Tansil and patient John Bridges - gave results. Pls give him office appt next week to see me to discuss results or if not, see TP. Let me know date. Wife crying on phone and very upset. SO get hm to see me asap    Nm Pet Image Initial (pi) Skull Base To Thigh  08/14/2015  CLINICAL DATA:  Initial treatment strategy for right upper lobe nodule. EXAM: NUCLEAR MEDICINE PET SKULL BASE TO THIGH TECHNIQUE: 9.0 mCi F-18 FDG was injected intravenously. Full-ring PET imaging was performed from the skull base to thigh after the radiotracer. CT data was obtained and used for attenuation correction and anatomic localization. FASTING BLOOD GLUCOSE:  Value: 116 mg/dl COMPARISON:  Low-dose chest CT on 08/02/2015 FINDINGS: FINDINGS NECK No hypermetabolic lymph nodes in the neck. CHEST No hypermetabolic mediastinal or hilar nodes. An irregular solid appearing nodular density is seen in the posterior right lung apex measuring 13 mm on image 13 of series 6. This shows metabolic activity with SUV max of 3.1. No other suspicious pulmonary nodules identified. Mild emphysema noted. No evidence pleural effusion. ABDOMEN/PELVIS No abnormal hypermetabolic activity within the liver, pancreas, adrenal glands, or spleen. No hypermetabolic lymph nodes in the abdomen or pelvis. SKELETON No focal hypermetabolic activity to suggest skeletal metastasis. IMPRESSION: 13 mm irregular solid nodular density in posterior right upper lobe apex is hypermetabolic with SUV max of 3.1. Early low-grade bronchogenic carcinoma cannot be excluded. No evidence of thoracic nodal or distant metastatic disease. Mild emphysema. Electronically Signed   By: Earle Gell M.D.   On: 08/14/2015 13:46

## 2015-08-14 NOTE — Telephone Encounter (Signed)
John Bridges  You are seeing John Bridges 08/18/15 _Fev1 > 2L. PEt HOT RUL nodule. New - I think needs referral to [facility]   Thanks  Dr. Brand Males, M.D., [facility].C.P Pulmonary and Critical Care Medicine Staff Physician [facility] Pager: [phone], If no answer or between  15:00h - 7:00h: call [phone]  08/14/2015 4:47 PM

## 2015-08-14 NOTE — Telephone Encounter (Signed)
Called and spoke to pt's wife, Carlyon Shadow. Carlyon Shadow is requesting the results of the PET scan for pt. Pt had PET scan today 08/14/2015.   MR please advise. Thanks.

## 2015-08-18 ENCOUNTER — Ambulatory Visit: Payer: Medicare HMO | Admitting: Adult Health

## 2015-08-21 ENCOUNTER — Ambulatory Visit (INDEPENDENT_AMBULATORY_CARE_PROVIDER_SITE_OTHER): Payer: Medicare HMO | Admitting: Adult Health

## 2015-08-21 ENCOUNTER — Encounter: Payer: Self-pay | Admitting: Adult Health

## 2015-08-21 VITALS — BP 144/82 | HR 78 | Temp 98.5°F | Ht 68.0 in | Wt 184.0 lb

## 2015-08-21 DIAGNOSIS — R911 Solitary pulmonary nodule: Secondary | ICD-10-CM | POA: Diagnosis not present

## 2015-08-21 DIAGNOSIS — J449 Chronic obstructive pulmonary disease, unspecified: Secondary | ICD-10-CM | POA: Diagnosis not present

## 2015-08-21 NOTE — Assessment & Plan Note (Signed)
Moderate COPD -essentially asymptomatic   Plan  May use your albuterol inhaler 2 puffs every 4hrs as needed -this is your rescue inhaler .  So glad you have quit smoking.  Follow up Dr. Chase Caller in 3 months and As needed

## 2015-08-21 NOTE — Progress Notes (Signed)
Subjective:    Patient ID: John Bridges, male    DOB: 1955/09/24, 60 y.o.   MRN: [id number]  HPI 60 yo male former smoker (07/2015 ) seen for pulmonary consult for bronchitis     08/21/2015 Follow up : CT /PET results.  Pt returns for 1 month follow up . Seen last ov for slow to resolve bronchitis .  He was improved by the time of the consult.  He was set up for PFT done on 08/02/15 FEV1 64%, ratio 64, FVC 76%, DLCO 77%.  He was referred for CT chest low dose screening  That showed a 63mm RUL nodule .  He was sent for PET scan that showed a hypermetabolic RUL nodule . No evidence of distant dz.  We discussed his results. He says he quit smoking 5 days ago. Has minimal cough or dyspnea.  Uses albuterol inhaler rarely.  Denies chest pain, orthopnea, , edema , hemoptysis, fever or weight loss.    Past Medical History  Diagnosis Date  . Hypertension   . Hyperlipidemia    Current Outpatient Prescriptions on File Prior to Visit  Medication Sig Dispense Refill  . amLODipine (NORVASC) 10 MG tablet Take 10 mg by mouth daily.    Marland Kitchen HYDROcodone-acetaminophen (NORCO) 7.5-325 MG tablet Take 1 tablet by mouth every 4 (four) hours as needed for moderate pain (Must last 30 days.  Do not drive or operate machinery while taking this medicine.). 120 tablet 0  . lisinopril-hydrochlorothiazide (PRINZIDE,ZESTORETIC) 20-12.5 MG per tablet Take 1 tablet by mouth daily.    . naproxen sodium (ALEVE) 220 MG tablet Take 440 mg by mouth daily.    . pantoprazole (PROTONIX) 40 MG tablet Take 1 tablet (40 mg total) by mouth daily. 30 tablet 0  . pravastatin (PRAVACHOL) 40 MG tablet Take 40 mg by mouth daily.    Marland Kitchen tiZANidine (ZANAFLEX) 4 MG tablet Take 4 mg by mouth every 6 (six) hours as needed for muscle spasms.     No current facility-administered medications on file prior to visit.     Review of Systems Constitutional:   No  weight loss, night sweats,  Fevers, chills, fatigue, or  lassitude.  HEENT:   No  headaches,  Difficulty swallowing,  Tooth/dental problems, or  Sore throat,                No sneezing, itching, ear ache, nasal congestion, post nasal drip,   CV:  No chest pain,  Orthopnea, PND, swelling in lower extremities, anasarca, dizziness, palpitations, syncope.   GI  No heartburn, indigestion, abdominal pain, nausea, vomiting, diarrhea, change in bowel habits, loss of appetite, bloody stools.   Resp: No shortness of breath with exertion or at rest.  No excess mucus, no productive cough,  No non-productive cough,  No coughing up of blood.  No change in color of mucus.  No wheezing.  No chest wall deformity  Skin: no rash or lesions.  GU: no dysuria, change in color of urine, no urgency or frequency.  No flank pain, no hematuria   MS:  No joint pain or swelling.  No decreased range of motion.  No back pain.  Psych:  No change in mood or affect. No depression or anxiety.  No memory loss.         Objective:   Physical Exam Filed Vitals:   08/21/15 1511  BP: 144/82  Pulse: 78  Temp: 98.5 F (36.9 C)  TempSrc: Oral  Height: 5\' 8"  (  1.727 m)  Weight: 184 lb (83.462 kg)  SpO2: 95%   GEN: A/Ox3; pleasant , NAD   HEENT:  /AT,  EACs-clear, TMs-wnl, NOSE-clear, THROAT-clear, no lesions, no postnasal drip or exudate noted.   NECK:  Supple w/ fair ROM; no JVD; normal carotid impulses w/o bruits; no thyromegaly or nodules palpated; no lymphadenopathy.  RESP  Clear  P & A; w/o, wheezes/ rales/ or rhonchi.no accessory muscle use, no dullness to percussion  CARD:  RRR, no m/r/g  , no peripheral edema, pulses intact, no cyanosis or clubbing.  GI:   Soft & nt; nml bowel sounds; no organomegaly or masses detected.  Musco: Warm bil, no deformities or joint swelling noted.   Neuro: alert, no focal deficits noted.    Skin: Warm, no lesions or rashes  Joplin Canty NP-C  [facility]  08/21/2015         Assessment & Plan:

## 2015-08-21 NOTE — Patient Instructions (Signed)
May use your albuterol inhaler 2 puffs every 4hrs as needed -this is your rescue inhaler .  So glad you have quit smoking.  We are referring you to surgeon to look at lung nodule.  Follow up Dr. Chase Caller in 3 months and As needed

## 2015-08-21 NOTE — Assessment & Plan Note (Signed)
Hypermetabolic Lung nodule on PET scan  PFT shows Moderate COPD with FEV1 at 64%.  Will refer to [facility]  So glad you have quit smoking. \ Follow up Dr. Chase Caller in 3 months and As needed

## 2015-08-22 NOTE — Telephone Encounter (Signed)
Ov with Gerhardt on 08/24/15

## 2015-08-24 ENCOUNTER — Encounter: Payer: Self-pay | Admitting: Cardiothoracic Surgery

## 2015-08-24 ENCOUNTER — Institutional Professional Consult (permissible substitution) (INDEPENDENT_AMBULATORY_CARE_PROVIDER_SITE_OTHER): Payer: Medicare HMO | Admitting: Cardiothoracic Surgery

## 2015-08-24 VITALS — BP 153/90 | HR 79 | Resp 16 | Ht 68.0 in | Wt 184.0 lb

## 2015-08-24 DIAGNOSIS — D381 Neoplasm of uncertain behavior of trachea, bronchus and lung: Secondary | ICD-10-CM | POA: Diagnosis not present

## 2015-08-24 NOTE — Patient Instructions (Signed)
Pulmonary Nodule A pulmonary nodule is a small, round growth of tissue in the lung. Pulmonary nodules can range in size from less than 1/5 inch (4 mm) to a little bigger than an inch (25 mm). Most pulmonary nodules are detected when imaging tests of the lung are being performed for a different problem. Pulmonary nodules are usually not cancerous (benign). However, some pulmonary nodules are cancerous (malignant). Follow-up treatment or testing is based on the size of the pulmonary nodule and your risk of getting lung cancer.  CAUSES Benign pulmonary nodules can be caused by various things. Some of the causes include:   Bacterial, fungal, or viral infections. This is usually an old infection that is no longer active, but it can sometimes be a current, active infection.  A benign mass of tissue.  Inflammation from conditions such as rheumatoid arthritis.   Abnormal blood vessels in the lungs. Malignant pulmonary nodules can result from lung cancer or from cancers that spread to the lung from other places in the body. SIGNS AND SYMPTOMS Pulmonary nodules usually do not cause symptoms. DIAGNOSIS Most often, pulmonary nodules are found incidentally when an X-ray or CT scan is performed to look for some other problem in the lung area. To help determine whether a pulmonary nodule is benign or malignant, your health care provider will take a medical history and order a variety of tests. Tests done may include:   Blood tests.  A skin test called a tuberculin test. This test is used to determine if you have been exposed to the germ that causes tuberculosis.   Chest X-rays. If possible, a new X-ray may be compared with X-rays you have had in the past.   CT scan. This test shows smaller pulmonary nodules more clearly than an X-ray.   Positron emission tomography (PET) scan. In this test, a safe amount of a radioactive substance is injected into the bloodstream. Then, the scan takes a picture of  the pulmonary nodule. The radioactive substance is eliminated from your body in your urine.   Biopsy. A tiny piece of the pulmonary nodule is removed so it can be checked under a microscope. TREATMENT  Pulmonary nodules that are benign normally do not require any treatment because they usually do not cause symptoms or breathing problems. Your health care provider may want to monitor the pulmonary nodule through follow-up CT scans. The frequency of these CT scans will vary based on the size of the nodule and the risk factors for lung cancer. For example, CT scans will need to be done more frequently if the pulmonary nodule is larger and if you have a history of smoking and a family history of cancer. Further testing or biopsies may be done if any follow-up CT scan shows that the size of the pulmonary nodule has increased. HOME CARE INSTRUCTIONS  Only take over-the-counter or prescription medicines as directed by your health care provider.  Keep all follow-up appointments with your health care provider. SEEK MEDICAL CARE IF:  You have trouble breathing when you are active.   You feel sick or unusually tired.   You do not feel like eating.   You lose weight without trying to.   You develop chills or night sweats.  SEEK IMMEDIATE MEDICAL CARE IF:  You cannot catch your breath, or you begin wheezing.   You cannot stop coughing.   You cough up blood.   You become dizzy or feel like you are going to pass out.   You   have sudden chest pain.   You have a fever or persistent symptoms for more than 2-3 days.   You have a fever and your symptoms suddenly get worse. MAKE SURE YOU:  Understand these instructions.  Will watch your condition.  Will get help right away if you are not doing well or get worse.   This information is not intended to replace advice given to you by your health care provider. Make sure you discuss any questions you have with your health care  provider.   Document Released: 03/03/2009 Document Revised: 01/06/2013 Document Reviewed: 10/26/2012 Elsevier Interactive Patient Education 2016 [employer]  Lung Cancer Lung cancer occurs when abnormal cells in the lung grow out of control and form a mass (tumor). There are several types of lung cancer. The two most common types are:  Non-small cell. In this type of lung cancer, abnormal cells are larger and grow more slowly than those of small cell lung cancer.  Small cell. In this type of lung cancer, abnormal cells are smaller than those of non-small cell lung cancer. Small cell lung cancer gets worse faster than non-small cell lung cancer. CAUSES  The leading cause of lung cancer is smoking tobacco. The second leading cause is radon exposure. RISK FACTORS  Smoking tobacco.  Exposure to secondhand tobacco smoke.  Exposure to radon gas.  Exposure to asbestos.  Exposure to arsenic in drinking water.  Air pollution.  Family or personal history of lung cancer.  Lung radiation therapy.  Being older than 65 years. SIGNS AND SYMPTOMS  In the early stages, symptoms may not be present. As the cancer progresses, symptoms may include:  A lasting cough, possibly with blood.  Fatigue.  Unexplained weight loss.  Shortness of breath.  Wheezing.  Chest pain.  Loss of appetite. Symptoms of advanced lung cancer include:  Hoarseness.  Bone or joint pain.  Weakness.  Nail problems.  Face or arm swelling.  Paralysis of the face.  Drooping eyelids. DIAGNOSIS  Lung cancer can be identified with a physical exam and with tests such as:  A chest X-ray.  A CT scan.  Blood tests.  A biopsy. After a diagnosis is made, you will have more tests to determine the stage of the cancer. The stages of non-small cell lung cancer are:  Stage 0, also called carcinoma in situ. At this stage, abnormal cells are found in the inner lining of your lung or lungs.  Stage I. At  this stage, abnormal cells have grown into a tumor that is no larger than 5 cm across. The cancer has entered the deeper lung tissue but has not yet entered the lymph nodes or other parts of the body.  Stage II. At this stage, the tumor is 7 cm across or smaller and has entered nearby lymph nodes. Or, the tumor is 5 cm across or smaller and has invaded surrounding tissue but is not found in nearby lymph nodes. There may be more than one tumor present.  Stage III. At this stage, the tumor may be any size. There may be more than one tumor in the lungs. The cancer cells have spread to the lymph nodes and possibly to other organs.  Stage IV. At this stage, there are tumors in both lungs and the cancer has spread to other areas of the body. The stages of small cell lung cancer are:  Limited. At this stage, the cancer is found only on one side of the chest.  Extensive. At this   stage, the cancer is in the lungs and in tissues on the other side of the chest. The cancer has spread to other organs or is found in the fluid between the layers of your lungs. TREATMENT  Depending on the type and stage of your lung cancer, you may be treated with:  Surgery. This is done to remove a tumor.  Radiation therapy. This treatment destroys cancer cells using X-rays or other types of radiation.  Chemotherapy. This treatment uses medicines to destroy cancer cells.  Targeted therapy. This treatment aims to destroy only cancer cells instead of all cells as other therapies do. You may also have a combination of treatments. HOME CARE INSTRUCTIONS   Do not use any tobacco products. This includes cigarettes, chewing tobacco, and electronic cigarettes. If you need help quitting, ask your health care provider.  Take medicines only as directed by your health care provider.  Eat a healthy diet. Work with a dietitian to make sure you are getting the nutrition you need.  Consider joining a support group or seeking  counseling to help you cope with the stress of having lung cancer.  Let your cancer specialist (oncologist) know if you are admitted to the hospital.  Keep all follow-up visits as directed by your health care provider. This is important. SEEK MEDICAL CARE IF:   You lose weight without trying.  You have a persistent cough and wheezing.  You feel short of breath.  You tire easily.  You experience bone or joint pain.  You have difficulty swallowing.  You feel hoarse or notice your voice changing.  Your pain medicine is not helping. SEEK IMMEDIATE MEDICAL CARE IF:   You cough up blood.  You have new breathing problems.  You develop chest pain.  You develop swelling in:  One or both ankles or legs.  Your face, neck, or arms.  You are confused.  You experience paralysis in your face or a drooping eyelid.   This information is not intended to replace advice given to you by your health care provider. Make sure you discuss any questions you have with your health care provider.   Document Released: 08/12/2000 Document Revised: 01/25/2015 Document Reviewed: 09/09/2013  [employer]vier Interactive Patient Education 2016 [employer] Lung Resection, Care After Refer to this sheet in the next few weeks. These instructions provide you with information on caring for yourself after your procedure. Your health care provider may also give you more specific instructions. Your treatment has been planned according to current medical practices, but problems sometimes occur. Call your health care provider if you have any problems or questions after your procedure. WHAT TO EXPECT AFTER THE PROCEDURE After your procedure, it is typical to have the following:  You may feel pain in your chest and throat. Patients may sometimes shiver or feel nauseous during recovery. HOME CARE INSTRUCTIONS You may resume a normal diet and activities as directed by your health care provider. Do not use any tobacco  products, including cigarettes, chewing tobacco, or electronic cigarettes. If you need help quitting, ask your health care provider. There are many different ways to close and cover an incision, including stitches, skin glue, and adhesive strips. Follow your health care provider's instructions on: Incision care. Bandage (dressing) changes and removal. Incision closure removal. Take medicines only as directed by your health care provider. Keep all follow-up visits as directed by your health care provider. This is important. Try to breathe deeply and cough as directed. Holding a pillow firmly over   your ribs may help with discomfort. If you were given an incentive spirometer in the hospital, continue to use it as directed by your health care provider. Walk as directed by your health care provider. You may take a shower and gently wash the area of your incision with water and soap as directed by your health care provider. Do not use anything else to clean your incision except as directed by your health care provider. Do not take baths, swim, or use a hot tub until your health care provider approves. SEEK MEDICAL CARE IF: You notice redness, swelling, or increasing pain at the incision site. You are bleeding at the incision site. You see pus coming from the incision site. You notice a bad smell coming from the incision site or bandage. Your incision breaks open. You cough up blood or pus, or you develop a cough that produces bad-smelling sputum. You have pain or swelling in your legs. You have increasing pain that is not controlled with medicine. You have trouble managing any of the tubes that have been left in place after surgery. You have fever or chills. SEEK IMMEDIATE MEDICAL CARE IF:  You have chest pain or an irregular or rapid heartbeat. You have dizzy episodes or faint. You have shortness of breath or difficulty breathing. You have persistent nausea or vomiting. You have a rash.    This information is not intended to replace advice given to you by your health care provider. Make sure you discuss any questions you have with your health care provider.   Document Released: 11/23/2004 Document Revised: 05/27/2014 Document Reviewed: 06/25/2013 Elsevier Interactive Patient Education 2016 [employer]  Lung Resection A lung resection is a procedure to remove part or all of a lung. When an entire lung is removed, the procedure is called a pneumonectomy. When only part of a lung is removed, the procedure is called a lobectomy. A lung resection is typically done to get rid of a tumor or cancer, but it may be done to treat other conditions. This procedure can help relieve some or all of your symptoms and can also help keep the problem from getting worse. Lung resection may provide the best chance for curing your disease. However, the procedure may not necessarily cure lung cancer if that is the problem.  LET YOUR HEALTH CARE PROVIDER KNOW ABOUT:  Any allergies you have. All medicines you are taking, including vitamins, herbs, eye drops, creams, and over-the-counter medicines. Previous problems you or members of your family have had with the use of anesthetics. Any blood disorders you have. Previous surgeries you have had. Medical conditions you have. RISKS AND COMPLICATIONS  Generally, lung resection is a safe procedure. However, problems can occur and include: Excessive bleeding. Infection. Inability to breathe without a ventilator. Persistent shortness of breath. Heart problems, including abnormal rhythms and a risk of heart attack or heart failure. Blood clots. Injury to a blood vessel. Injury to a nerve. Failure to heal properly. Stroke. Bronchopleural fistula. This is a small hole between one of the main breathing tubes (bronchus) and the lining of the lungs. This is rare. Reaction to anesthesia. BEFORE THE PROCEDURE  You may have tests done before the procedure,  including: Blood tests. Urine tests. X-rays. Other imaging tests (such as CT scans, MRI scans, and PET scans). These tests are done to find the exact size and location of the condition being treated with this surgery. Pulmonary function tests. These are breathing tests to assess the function of your   lungs before surgery and to decide how to best help your breathing after surgery. Heart testing. This is done to make sure your heart is strong enough for the procedure. Bronchoscopy. This is a technique that allows your health care provider to look at the inside of your airways. This is done using a soft, flexible tube (bronchoscope). Along with imaging tests, this can help your health care provider know the exact location and size of the area that will be removed during surgery. Lymph node sampling. This may need to be done to see if the tumor has spread. It may be done as a separate surgery or right before your lung resection procedure. PROCEDURE An IV tube will be placed in your arm. You will be given a medicine that makes you fall asleep (general anesthetic). You may also get pain medicine through a thin, flexible tube (catheter) in your back. A breathing tube will be placed in your throat. Once the surgical team has prepared you for surgery, your surgeon will make an incision on your side. Some resections are done through large incisions, while others can be done through small incisions using smaller instruments and assisted with small cameras (laparoscopic surgery). Your surgeon will carefully cut the veins, arteries, and bronchus leading to your lung. After being cut, each of these pieces will be sewn or stapled closed. The lung or part of the lung will then be removed. Your surgeon will check inside your chest to make sure there is no bleeding in or around the lungs. Lymph nodes near the lung may also be removed for later tests. Your surgeon may put tubes into your chest to drain extra fluid and  air after surgery. Your incision will be closed. This may be done using: Stitches that absorb into your body and do not need to be removed. Stitches that must be removed. Staples that must be removed. AFTER THE PROCEDURE  You will be taken to the recovery area and your progress will be monitored. You may still have a breathing tube and other tubes or catheters in your body immediately after surgery. These will be removed during your recovery. You may be put on a respirator following surgery if some assistance is needed to help your breathing. When you are awake and not experiencing immediate problems from surgery, you will be moved to the intensive care unit (ICU) where you will continue your recovery. You may feel pain in your chest and throat. Sometimes during recovery, patients may shiver or feel nauseous. You will be given medicine to help with pain and nausea. The breathing tube will be taken out as soon as your health care providers feel you can breathe on your own. For most people, this happens on the same day as the surgery. If your surgery and time in the ICU go well, most of the tubes and equipment will be taken out within 1-2 days after surgery. This is about how long most people stay in the ICU. You may need to stay longer, depending on how you are doing. You should also start respiratory therapy in the ICU. This therapy uses breathing exercises to help your other lung stay healthy and get stronger. As you improve, you will be moved to a regular hospital room for continued respiratory therapy, help with your bladder and bowels, and to continue medicines. After your lung or part of your lung is taken out, there will be a space inside your chest. This space will often fill up with fluid over   time. The amount of time this takes is different for each person. You will receive care until you are doing well and your health care provider feels it is safe for you to go home or to transfer to an  extended care facility.   This information is not intended to replace advice given to you by your health care provider. Make sure you discuss any questions you have with your health care provider.   Document Released: 07/27/2002 Document Revised: 05/27/2014 Document Reviewed: 06/25/2013 [employer] Interactive Patient Education 2016 [employer]    

## 2015-08-24 NOTE — Progress Notes (Signed)
[address]       [place] [address]             [phone]                    Shahir L Hardiman [place] Medical Record [id number] Date of Birth: [birth year]  Referring: Melvenia Needles, NP Primary Care: Antionette Fairy, PA-C  Chief Complaint:    Chief Complaint  Patient presents with  . Lung Lesion    RUL per CT CHEST 08/02/15.Marland KitchenMarland KitchenPFT 08/02/15 @ [place]    History of Present Illness:    John Bridges 60 y.o. male is seen in the office  today for New discovery of a right upper lobe lung nodule 1.3 cm in size. The patient has been a long-term smoker more than 30 pack years. His wife is disabled with severe pulmonary disease and is followed by Dr. Chase Caller. The patient had increasing shortness of breath and some coughing and was encouraged by his wife to go to see her doctors. Evaluation including pulmonary function studies CT scan of the chest low dose and ultimately a PET scan was performed followed pulmonary department and the patient referred because of a new slightly [id number] hypermetabolic 1.3 cm mass in the right upper lobe. The patient has stopped smoking 8 days ago.  The patient has had no previous cardiac history does have extensive coronary calcifications on his CT of the chest and has a strong family history of in his brothers of both coronary artery disease and stroke.      Current Activity/ Functional Status:  Patient is independent with mobility/ambulation, transfers, ADL's, IADL's. The patient is disabled because of rotator cuff and knee injuries, however he does care for his wife who is limited due to pulmonary disease   Zubrod Score: At the time of surgery this patient's most appropriate activity status/level should be described as: []     0    Normal activity, no symptoms [x]     1    Restricted in physical strenuous activity but ambulatory, able to do out light work []     2    Ambulatory and capable of self care, unable to do work activities,  up and about               >50 % of waking hours                              []     3    Only limited self care, in bed greater than 50% of waking hours []     4    Completely disabled, no self care, confined to bed or chair []     5    Moribund   Past Medical History  Diagnosis Date  . Hypertension   . Hyperlipidemia     Past Surgical History  Procedure Laterality Date  . 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 Hypertension Mother   . Hypertension Father   . Cancer Brother   Patient has one brother age 69 with lung cancer metastatic to the brain currently in hospice with 2 weeks estimated survival per the patient, one brother has had a stroke and is paralyzed, one brother recently had coronary  stents placed  Social History   Social History  . Marital Status: Single    Spouse Name: N/A  . Number of Children: N/A  . Years of Education: N/A   Occupational History  . disabled    Social History Main Topics  . Smoking status: Former Smoker -- 1.00 packs/day for 40 years    Types: Cigarettes    Quit date: 08/16/2015  . Smokeless tobacco: Not on file  . Alcohol Use: 0.0 oz/week    0 Standard drinks or equivalent per week     Comment: occasional beer  . Drug Use: No  . Sexual Activity: Not on file   Other Topics Concern  . Not on file   Social History Narrative    History  Smoking status  . Former Smoker -- 1.00 packs/day for 40 years  . Types: Cigarettes  . Quit date: 08/16/2015  Smokeless tobacco  . Not on file    History  Alcohol Use  . 0.0 oz/week  . 0 Standard drinks or equivalent per week    Comment: occasional beer     No Known Allergies  Current Outpatient Prescriptions  Medication Sig Dispense Refill  . amLODipine (NORVASC) 10 MG tablet Take 10 mg by mouth daily.    Marland Kitchen HYDROcodone-acetaminophen (NORCO) 7.5-325 MG tablet Take 1 tablet  by mouth every 4 (four) hours as needed for moderate pain (Must last 30 days.  Do not drive or operate machinery while taking this medicine.). 120 tablet 0  . lisinopril-hydrochlorothiazide (PRINZIDE,ZESTORETIC) 20-12.5 MG per tablet Take 1 tablet by mouth daily.    . naproxen sodium (ALEVE) 220 MG tablet Take 440 mg by mouth daily.    . pantoprazole (PROTONIX) 40 MG tablet Take 1 tablet (40 mg total) by mouth daily. 30 tablet 0  . pravastatin (PRAVACHOL) 40 MG tablet Take 40 mg by mouth daily.    Marland Kitchen tiZANidine (ZANAFLEX) 4 MG tablet Take 4 mg by mouth every 6 (six) hours as needed for muscle spasms.     No current facility-administered medications for this visit.      Review of Systems:     Cardiac Review of Systems: Y or N  Chest Pain [  n  ]  Resting SOB [  n ] Exertional SOB  [ y ]  Vertell Limber Florencio.Farrier  ]   Pedal Edema [ n  ]    Palpitations [ n ] Syncope  [  n]   Presyncope [ n  ]  General Review of Systems: [Y] = yes [  ]=no Constitional: recent weight change [ n ];  Wt loss over the last 3 months [ n  ] anorexia [  ]; fatigue [ n ]; nausea [  ]; night sweats [  ]; fever [n  ]; or chills [  ];          Dental: poor dentition[  ]; Last Dentist visit:   Eye : blurred vision [  ]; diplopia [   ]; vision changes [  ];  Amaurosis fugax[  ]; Resp: cough [ y ];  wheezing[ n ];  hemoptysis[n  ]; shortness of breath[n  ]; paroxysmal nocturnal dyspnea[n  ]; dyspnea on exertion[y  ]; or orthopnea[ n ];  GI:  gallstones[ n ], vomiting[  ];  dysphagia[  ]; melena[n  ];  hematochezia [  ]; heartburn[  ];   Hx of  Colonoscopy[n  ]; GU: kidney stones [  ]; hematuria[  ];   dysuria [  ];  nocturia[  ];  history of     obstruction [  ]; urinary frequency [  ]             Skin: rash, swelling[  ];, hair loss[  ];  peripheral edema[  ];  or itching[  ]; Musculosketetal: myalgias[  ];  joint swelling[  ];  joint erythema[  ];  joint pain[  ];  back pain[  ];  Heme/Lymph: bruising[n  ];  bleeding[  ];  anemia[  ];   Neuro: TIA[  ];  headaches[  ];  stroke[  ];  vertigo[  ];  seizures[  ];   paresthesias[n  ];  difficulty walking[n  ];  Psych:depression[n  ]; anxiety[ n ];  Endocrine: diabetes[n  ];  thyroid dysfunctionn[  ];  Immunizations: Flu up to date [  ]; Pneumococcal up to date [  ];  Other:  Physical Exam: BP 153/90 mmHg  Pulse 79  Resp 16  Ht 5\' 8"  (1.727 m)  Wt 184 lb (83.462 kg)  BMI 27.98 kg/m2  SpO2 94%  PHYSICAL EXAMINATION: General appearance: alert, cooperative, appears older than stated age and no distress Head: Normocephalic, without obvious abnormality, atraumatic Neck: no adenopathy, no carotid bruit, no JVD, supple, symmetrical, trachea midline and thyroid not enlarged, symmetric, no tenderness/mass/nodules Lymph nodes: Cervical, supraclavicular, and axillary nodes normal. Resp: clear to auscultation bilaterally Back: symmetric, no curvature. ROM normal. No CVA tenderness. Cardio: regular rate and rhythm, S1, S2 normal, no murmur, click, rub or gallop GI: soft, non-tender; bowel sounds normal; no masses,  no organomegaly Extremities: extremities normal, atraumatic, no cyanosis or edema and Homans sign is negative, no sign of DVT Neurologic: Grossly normal  Diagnostic Studies & Laboratory data:     Recent Radiology Findings:   Nm Pet Image Initial (pi) Skull Base To Thigh  08/14/2015  CLINICAL DATA:  Initial treatment strategy for right upper lobe nodule. EXAM: NUCLEAR MEDICINE PET SKULL BASE TO THIGH TECHNIQUE: 9.0 mCi F-18 FDG was injected intravenously. Full-ring PET imaging was performed from the skull base to thigh after the radiotracer. CT data was obtained and used for attenuation correction and anatomic localization. FASTING BLOOD GLUCOSE:  Value: 116 mg/dl COMPARISON:  Low-dose chest CT on 08/02/2015 FINDINGS: FINDINGS NECK No hypermetabolic lymph nodes in the neck. CHEST No hypermetabolic mediastinal or hilar nodes. An irregular solid appearing nodular density is  seen in the posterior right lung apex measuring 13 mm on image 13 of series 6. This shows metabolic activity with SUV max of 3.1. No other suspicious pulmonary nodules identified. Mild emphysema noted. No evidence pleural effusion. ABDOMEN/PELVIS No abnormal hypermetabolic activity within the liver, pancreas, adrenal glands, or spleen. No hypermetabolic lymph nodes in the abdomen or pelvis. SKELETON No focal hypermetabolic activity to suggest skeletal metastasis. IMPRESSION: 13 mm irregular solid nodular density in posterior right upper lobe apex is hypermetabolic with SUV max of 3.1. Early low-grade bronchogenic carcinoma cannot be excluded. No evidence of thoracic nodal or distant metastatic disease. Mild emphysema. Electronically Signed   By: Earle Gell M.D.   On: 08/14/2015 13:46   Ct Chest Lung Ca Screen Low Dose W/o Cm  08/03/2015  CLINICAL DATA:  61 year old male current smoker with 45 pack-year history of smoking, for initial lung cancer screening EXAM: CT CHEST WITHOUT CONTRAST LOW-DOSE FOR LUNG CANCER SCREENING TECHNIQUE: Multidetector CT imaging of the chest was performed following the standard protocol without IV contrast. COMPARISON:  None. FINDINGS: Mediastinum/Nodes: Heart is normal in size.  No pericardial effusion. Coronary atherosclerosis involving the LAD and right coronary artery. Atherosclerotic calcifications of the aortic arch. Small mediastinal lymph nodes which do not meet pathologic CT size criteria, including a 7 mm short axis low right paratracheal node (series 2/ image 26) and a 7 mm short axis subcarinal node (series 2/image 30). While prominent nodal soft tissue is suspected in the right hilar region (series 2/ image 32), this is poorly evaluated in the absence of intravenous contrast. Visualized thyroid is unremarkable. Lungs/Pleura: Irregular nodular opacity in the posterior right upper lobe (series 3/image 60), volumetric mean approximately 13.8 mm, with extension to the pleural  surface (series 3/image 60). While unusual apical pleural-parenchymal scarring is possible, neoplasm is not excluded. By size criteria, this reflects a Lung Rads 4A lesion. The lungs are otherwise clear. Mild centrilobular and paraseptal emphysematous changes, upper lobe predominant. No focal consolidation. No pleural effusion or pneumothorax. Upper abdomen: Visualized upper abdomen is unremarkable. Musculoskeletal: Visualized osseous structures are within normal limits. IMPRESSION: Irregular nodular opacity in the posterior right upper lobe, volumetric mean approximately 13.8 mm, with extension to the pleural surface. While unusual apical pleural-parenchymal scarring is possible, neoplasm is not excluded. By size criteria, this reflects a Lung Rads 4A lesion. Lung-RADS Category 4A, suspicious. Follow up low-dose chest CT without contrast in 3 months (please use the following order, "CT CHEST LCS NODULE FOLLOW-UP W/O CM") is recommended. Alternatively, PET may be considered. Electronically Signed   By: Julian Hy M.D.   On: 08/03/2015 11:29      I have independently reviewed the above  cath films and reviewed the findings with the  patient .   Recent Lab Findings: Lab Results  Component Value Date   WBC 11.3* 09/11/2013   HGB 14.4 09/11/2013   HCT 41.3 09/11/2013   PLT 332 09/11/2013   GLUCOSE 115* 09/11/2013   ALT 17 09/11/2013   AST 20 09/11/2013   NA 135* 09/11/2013   K 4.4 09/11/2013   CL 97 09/11/2013   CREATININE 0.81 09/11/2013   BUN 19 09/11/2013   CO2 28 09/11/2013   PFT's  Interpretation: 1. spirometry shows moderate obstructive ventilatory defect without significant bronchodilator improvement. 2.lung volumes show air trapping. 3.airway resistance is high. 4. DLCO mildly reduced. 5. consistent with COPD/asthma  FEV1 1.99 58% DLCO 22.86 77%  Assessment / Plan:   Patient with long-term smoking history presents with the new discovery of a 1.3 cm slightly hypermetabolic  suspicious lesion in the right upper lobe, possibly clinical stage I lung cancer versus inflammatory nodule.  Strong family history of coronary artery disease and stroke in a patient with severely calcified coronaries on chest CT I discussed with the patient the possible diagnosis of carcinoma the lung and recommended that we having obtain cardiology clearance, hopefully this can be done next several days and we can proceed with surgery the week of April 10 or April 17. After cardiology clearance we will proceed with bronchoscopy right video-assisted thoracoscopy wedge resection of right upper lobe lung lesion possible lobectomy and node dissection. The risks and options were discussed with the patient in detail and he is willing to proceed.      I  spent 40 minutes counseling the patient face to face and 50% or more the  time was spent in counseling and coordination of care. The total time spent in the appointment was 60 minutes.  Grace Isaac MD      [address] [place] [address] Office  [place] [phone]  08/24/2015 3:26 PM

## 2015-08-28 ENCOUNTER — Encounter: Payer: Self-pay | Admitting: Cardiology

## 2015-08-28 ENCOUNTER — Encounter: Payer: Self-pay | Admitting: *Deleted

## 2015-08-28 ENCOUNTER — Ambulatory Visit: Payer: Medicare HMO | Admitting: Cardiology

## 2015-08-28 ENCOUNTER — Ambulatory Visit (INDEPENDENT_AMBULATORY_CARE_PROVIDER_SITE_OTHER): Payer: Medicare HMO | Admitting: Cardiology

## 2015-08-28 ENCOUNTER — Telehealth: Payer: Self-pay | Admitting: Cardiology

## 2015-08-28 VITALS — BP 146/80 | HR 77 | Ht 68.0 in | Wt 186.4 lb

## 2015-08-28 DIAGNOSIS — Z01810 Encounter for preprocedural cardiovascular examination: Secondary | ICD-10-CM | POA: Diagnosis not present

## 2015-08-28 DIAGNOSIS — I10 Essential (primary) hypertension: Secondary | ICD-10-CM

## 2015-08-28 DIAGNOSIS — Z136 Encounter for screening for cardiovascular disorders: Secondary | ICD-10-CM

## 2015-08-28 DIAGNOSIS — J449 Chronic obstructive pulmonary disease, unspecified: Secondary | ICD-10-CM

## 2015-08-28 DIAGNOSIS — Z720 Tobacco use: Secondary | ICD-10-CM

## 2015-08-28 DIAGNOSIS — R911 Solitary pulmonary nodule: Secondary | ICD-10-CM

## 2015-08-28 DIAGNOSIS — E782 Mixed hyperlipidemia: Secondary | ICD-10-CM

## 2015-08-28 DIAGNOSIS — I2510 Atherosclerotic heart disease of native coronary artery without angina pectoris: Secondary | ICD-10-CM

## 2015-08-28 NOTE — Telephone Encounter (Signed)
precert in progress

## 2015-08-28 NOTE — Patient Instructions (Signed)
Your physician recommends that you continue on your current medications as directed. Please refer to the Current Medication list given to you today. Your physician has requested that you have a lexiscan myoview. For further information please visit [email or web address]. Please follow instruction sheet, as given. Your physician has requested that you have an echocardiogram. Echocardiography is a painless test that uses sound waves to create images of your heart. It provides your doctor with information about the size and shape of your heart and how well your heart's chambers and valves are working. This procedure takes approximately one hour. There are no restrictions for this procedure. We will call you with your results.

## 2015-08-28 NOTE — Telephone Encounter (Signed)
Scheduled April 13th arrive at 800 for [facility] on medications this week at [facility] per Domenic Polite dx: coronary atherosclerosis w/o angina & pre-op evaluation April 13th at 2pm 2 D Echo this week at [facility] per Domenic Polite dx: coronary atherosclerosis w/o angina & pre-op evaluation

## 2015-08-28 NOTE — Progress Notes (Signed)
Cardiology Office Note  Date: 08/28/2015   ID: John Bridges, DOB December 05, 1955, MRN [id number]  PCP: Antionette Fairy PA-C  Referring provider: Dr. Lanelle Bal Consulting Cardiologist: Rozann Lesches, MD   Chief Complaint  Patient presents with  . Preoperative evaluation    History of Present Illness: John Bridges is a 60 y.o. male referred for cardiology consultation by Dr. Servando Snare. He has a recently diagnosed 1.3 cm right upper lobe lung nodules suspicious for lung cancer in the setting of long-standing tobacco abuse and COPD. He is being considered for bronchoscopy with a VATS and wedge resection in the near future. Preoperative chest CT also demonstrated coronary atherosclerosis within the LAD and RCA distributions as well as atherosclerosis within the aortic arch.  He reports NYHA class II dyspnea with flareups of "bronchitis" intermittently. No exertional chest pain or tightness. He previously worked in [employer] in the area, not working at present. Recently trying to quit smoking after long-standing history of tobacco abuse.  Family history includes CAD and a brother in his 92s, stroke in another brother.  I reviewed his ECG today which shows normal sinus rhythm. He has not undergone any objective ischemic testing.  Past Medical History  Diagnosis Date  . Essential hypertension   . Hyperlipidemia   . Lung nodule     Right upper lobe lung nodule 1.3 cm   . COPD (chronic obstructive pulmonary disease) [facility])     Past Surgical History  Procedure Laterality Date  . No past surgeries      Current Outpatient Prescriptions  Medication Sig Dispense Refill  . amLODipine (NORVASC) 10 MG tablet Take 10 mg by mouth daily.    Marland Kitchen HYDROcodone-acetaminophen (NORCO) 7.5-325 MG tablet Take 1 tablet by mouth every 4 (four) hours as needed for moderate pain (Must last 30 days.  Do not drive or operate machinery while taking this medicine.). 120 tablet 0  .  lisinopril-hydrochlorothiazide (PRINZIDE,ZESTORETIC) 20-12.5 MG per tablet Take 1 tablet by mouth daily.    . pantoprazole (PROTONIX) 40 MG tablet Take 1 tablet (40 mg total) by mouth daily. 30 tablet 0  . pravastatin (PRAVACHOL) 40 MG tablet Take 40 mg by mouth daily.    Marland Kitchen tiZANidine (ZANAFLEX) 4 MG tablet Take 4 mg by mouth every 6 (six) hours as needed for muscle spasms.     No current facility-administered medications for this visit.   Allergies:  Review of patient's allergies indicates no known allergies.   Social History: The patient  reports that he quit smoking 12 days ago. His smoking use included Cigarettes. He has a 40 pack-year smoking history. He does not have any smokeless tobacco history on file. He reports that he drinks alcohol. He reports that he does not use illicit drugs.   Family History: The patient's family history includes CAD in his brother; Cancer in his brother; Hypertension in his father and mother; Stroke in his brother.   ROS:  Please see the history of present illness. Otherwise, complete review of systems is positive for intermittent episodes of bronchitis. Arthritic pains. Trouble with his right shoulder, has dislocated it previously. All other systems are reviewed and negative.   Physical Exam: VS:  BP 146/80 mmHg  Pulse 77  Ht 5\' 8"  (1.727 m)  Wt 186 lb 6.4 oz (84.55 kg)  BMI 28.35 kg/m2  SpO2 94%, BMI Body mass index is 28.35 kg/(m^2).  Wt Readings from Last 3 Encounters:  08/28/15 186 lb 6.4 oz (84.55 kg)  08/24/15 184 lb (83.462 kg)  08/21/15 184 lb (83.462 kg)    General: Patient appears comfortable at rest. HEENT: Conjunctiva and lids normal, oropharynx clear with poor dentition. Neck: Supple, no elevated JVP or carotid bruits, no thyromegaly. Lungs: Diminished breath sounds without wheezing, nonlabored breathing at rest. Cardiac: Regular rate and rhythm, no S3 or significant systolic murmur, no pericardial rub. Abdomen: Soft, nontender, bowel  sounds present, no guarding or rebound. Extremities: No pitting edema, distal pulses 2+. Skin: Warm and dry. Musculoskeletal: No kyphosis. Neuropsychiatric: Alert and oriented x3, affect grossly appropriate.  ECG: No old tracing for comparison.  Recent Labwork:  April 2015: Hemoglobin 14.4, platelets 332, potassium 4.4, BUN 19, creatinine 0.8, AST 20, ALT 17  Other Studies Reviewed Today:  Chest CT 08/02/2015: FINDINGS: Mediastinum/Nodes: Heart is normal in size. No pericardial effusion.  Coronary atherosclerosis involving the LAD and right coronary artery.  Atherosclerotic calcifications of the aortic arch.  Small mediastinal lymph nodes which do not meet pathologic CT size criteria, including a 7 mm short axis low right paratracheal node (series 2/ image 26) and a 7 mm short axis subcarinal node (series 2/image 30).  While prominent nodal soft tissue is suspected in the right hilar region (series 2/ image 32), this is poorly evaluated in the absence of intravenous contrast.  Visualized thyroid is unremarkable.  Lungs/Pleura: Irregular nodular opacity in the posterior right upper lobe (series 3/image 60), volumetric mean approximately 13.8 mm, with extension to the pleural surface (series 3/image 60). While unusual apical pleural-parenchymal scarring is possible, neoplasm is not excluded. By size criteria, this reflects a Lung Rads 4A lesion.  The lungs are otherwise clear.  Mild centrilobular and paraseptal emphysematous changes, upper lobe predominant.  No focal consolidation.  No pleural effusion or pneumothorax.  Upper abdomen: Visualized upper abdomen is unremarkable.  Musculoskeletal: Visualized osseous structures are within normal limits.  IMPRESSION: Irregular nodular opacity in the posterior right upper lobe, volumetric mean approximately 13.8 mm, with extension to the pleural surface. While unusual apical pleural-parenchymal scarring  is possible, neoplasm is not excluded. By size criteria, this reflects a Lung Rads 4A lesion.  Lung-RADS Category 4A, suspicious. Follow up low-dose chest CT without contrast in 3 months (please use the following order, "CT CHEST LCS NODULE FOLLOW-UP W/O CM") is recommended. Alternatively, PET may be considered.  PET scan 08/14/2015: FINDINGS NECK  No hypermetabolic lymph nodes in the neck.  CHEST  No hypermetabolic mediastinal or hilar nodes.  An irregular solid appearing nodular density is seen in the posterior right lung apex measuring 13 mm on image 13 of series 6. This shows metabolic activity with SUV max of 3.1. No other suspicious pulmonary nodules identified. Mild emphysema noted. No evidence pleural effusion.  ABDOMEN/PELVIS  No abnormal hypermetabolic activity within the liver, pancreas, adrenal glands, or spleen. No hypermetabolic lymph nodes in the abdomen or pelvis.  SKELETON  No focal hypermetabolic activity to suggest skeletal metastasis.  IMPRESSION: 13 mm irregular solid nodular density in posterior right upper lobe apex is hypermetabolic with SUV max of 3.1. Early low-grade bronchogenic carcinoma cannot be excluded.  No evidence of thoracic nodal or distant metastatic disease.  Mild emphysema.  Assessment and Plan:  1. Preoperative evaluation in a 60 year old male with long-standing tobacco abuse, COPD with recently diagnosed pulmonary nodule suspicious for lung cancer, hypertension, and hyperlipidemia. Coronary atherosclerosis was noted based on recent chest CT imaging. ECG today is normal. Plan is to obtain a Lexiscan Cardiolite and echocardiogram for objective cardiac  structural and ischemic evaluation.  2. Essential hypertension, blood pressure is mildly elevated today. He is on Norvasc and Prinzide.  3. Hyperlipidemia on Pravachol.  4. COPD with long-standing tobacco abuse, recently trying to quit smoking cigarettes.  5. Right  upper lobe lung nodule, 1.3 cm. Dr. Servando Snare plans bronchoscopy with VATS and wedge resection.  Current medicines were reviewed with the patient today.   Orders Placed This Encounter  Procedures  . NM Myocar Multi W/Spect W/Wall Motion / EF  . EKG 12-Lead  . ECHOCARDIOGRAM COMPLETE    Disposition: Call with results.Weston Brass, Satira Sark, MD, [facility] 08/28/2015 9:30 AM    [place] [facility], [place], [place] [address] Phone: [phone]; Fax: [phone]

## 2015-08-30 ENCOUNTER — Telehealth: Payer: Self-pay

## 2015-08-30 NOTE — Telephone Encounter (Signed)
Patient called requesting Hydrocodone refill.

## 2015-08-31 ENCOUNTER — Encounter (HOSPITAL_COMMUNITY)
Admission: RE | Admit: 2015-08-31 | Discharge: 2015-08-31 | Disposition: A | Discharge status: Home / Self Care | Payer: Medicare HMO | Source: Ambulatory Visit | Attending: Cardiology | Admitting: Cardiology

## 2015-08-31 ENCOUNTER — Ambulatory Visit (HOSPITAL_COMMUNITY)
Admission: RE | Admit: 2015-08-31 | Discharge: 2015-08-31 | Disposition: A | Discharge status: Home / Self Care | Payer: Medicare HMO | Source: Ambulatory Visit | Attending: Cardiology | Admitting: Cardiology

## 2015-08-31 ENCOUNTER — Encounter (HOSPITAL_COMMUNITY): Payer: Self-pay

## 2015-08-31 ENCOUNTER — Inpatient Hospital Stay (HOSPITAL_COMMUNITY): Admission: RE | Admit: 2015-08-31 | Payer: Medicare HMO | Source: Ambulatory Visit

## 2015-08-31 DIAGNOSIS — Z01818 Encounter for other preprocedural examination: Secondary | ICD-10-CM | POA: Insufficient documentation

## 2015-08-31 DIAGNOSIS — Z720 Tobacco use: Secondary | ICD-10-CM | POA: Diagnosis not present

## 2015-08-31 DIAGNOSIS — I358 Other nonrheumatic aortic valve disorders: Secondary | ICD-10-CM | POA: Diagnosis not present

## 2015-08-31 DIAGNOSIS — Z01810 Encounter for preprocedural cardiovascular examination: Secondary | ICD-10-CM | POA: Diagnosis not present

## 2015-08-31 DIAGNOSIS — I519 Heart disease, unspecified: Secondary | ICD-10-CM | POA: Diagnosis not present

## 2015-08-31 DIAGNOSIS — I517 Cardiomegaly: Secondary | ICD-10-CM | POA: Insufficient documentation

## 2015-08-31 DIAGNOSIS — I2510 Atherosclerotic heart disease of native coronary artery without angina pectoris: Secondary | ICD-10-CM | POA: Diagnosis present

## 2015-08-31 DIAGNOSIS — I340 Nonrheumatic mitral (valve) insufficiency: Secondary | ICD-10-CM | POA: Insufficient documentation

## 2015-08-31 LAB — NM MYOCAR MULTI W/SPECT W/WALL MOTION / EF
LV dias vol: 122 mL (ref 62–150)
LV sys vol: 45 mL
Peak HR: 92 {beats}/min
RATE: 0.35
Rest HR: 71 {beats}/min
SDS: 2
SRS: 2
SSS: 4
TID: 0.99

## 2015-08-31 MED ORDER — TECHNETIUM TC 99M SESTAMIBI GENERIC - CARDIOLITE
10.0000 | Freq: Once | INTRAVENOUS | Status: AC | PRN
  Administered 2015-08-31: 10.4 via INTRAVENOUS

## 2015-08-31 MED ORDER — REGADENOSON 0.4 MG/5ML IV SOLN
INTRAVENOUS | Status: AC
  Administered 2015-08-31: 0.4 mg via INTRAVENOUS
  Filled 2015-08-31: qty 5

## 2015-08-31 MED ORDER — TECHNETIUM TC 99M SESTAMIBI - CARDIOLITE
30.0000 | Freq: Once | INTRAVENOUS | Status: AC | PRN
  Administered 2015-08-31: 10:00:00 30.2 via INTRAVENOUS

## 2015-08-31 MED ORDER — HYDROCODONE-ACETAMINOPHEN 7.5-325 MG PO TABS: 1.0000 | ORAL_TABLET | ORAL | Status: DC | PRN

## 2015-08-31 MED ORDER — SODIUM CHLORIDE 0.9% FLUSH
INTRAVENOUS | Status: AC
  Administered 2015-08-31: 10 mL via INTRAVENOUS
  Filled 2015-08-31: qty 10

## 2015-08-31 NOTE — Telephone Encounter (Signed)
Rx done. 

## 2015-09-04 ENCOUNTER — Other Ambulatory Visit: Payer: Self-pay | Admitting: *Deleted

## 2015-09-04 ENCOUNTER — Telehealth: Payer: Self-pay | Admitting: Internal Medicine

## 2015-09-04 DIAGNOSIS — R911 Solitary pulmonary nodule: Secondary | ICD-10-CM

## 2015-09-04 MED ORDER — PREDNISONE 10 MG PO TABS: ORAL_TABLET | ORAL | Status: DC

## 2015-09-04 MED ORDER — DOXYCYCLINE HYCLATE 100 MG PO TABS: ORAL_TABLET | ORAL | Status: DC

## 2015-09-04 NOTE — Telephone Encounter (Signed)
congrats in quitting smoking   He has aecopd  You have mild attack of copd called COPD exacerbation Please take doxycycline 100mg  twice daily after meals x 5 days; avoid sunlight Please take Please take prednisone 40 mg x1 day, then 30 mg x1 day, then 20 mg x1 day, then 10 mg x1 day, and then 5 mg x1 day and stop

## 2015-09-04 NOTE — Telephone Encounter (Signed)
Called and spoke to pt. Informed him of the recs per MR. Rx sent to preferred pharmacy. Pt verbalized understanding and denied any further questions or concerns at this time.   

## 2015-09-04 NOTE — Telephone Encounter (Signed)
Spoke with pt.  Pt c/o chest tightness and congestion x 5 days.  Denies SOB, cough, CP, wheezing, or f/c/s.  Pt is using ventolin hfa with no relief.  Offered OV- pt was seen by TP on 08/21/15 and would like to see if recs can be given over the phone.  MR, please advise.  Thank you.  MR, pt would like you to be aware he quit smoking x 18 days.    No Known Allergies

## 2015-09-07 ENCOUNTER — Telehealth: Payer: Self-pay | Admitting: Orthopaedic Surgery

## 2015-09-07 NOTE — Telephone Encounter (Signed)
Rx done. 

## 2015-09-21 ENCOUNTER — Encounter (HOSPITAL_COMMUNITY): Payer: Self-pay

## 2015-09-21 ENCOUNTER — Encounter (HOSPITAL_COMMUNITY)
Admission: RE | Admit: 2015-09-21 | Discharge: 2015-09-21 | Disposition: A | Discharge status: Home / Self Care | Payer: Medicare HMO | Source: Ambulatory Visit | Attending: Cardiothoracic Surgery | Admitting: Cardiothoracic Surgery

## 2015-09-21 VITALS — BP 154/77 | HR 83 | Temp 98.2°F | Resp 20 | Ht 68.0 in | Wt 190.0 lb

## 2015-09-21 DIAGNOSIS — I10 Essential (primary) hypertension: Secondary | ICD-10-CM | POA: Diagnosis not present

## 2015-09-21 DIAGNOSIS — Z87891 Personal history of nicotine dependence: Secondary | ICD-10-CM | POA: Diagnosis not present

## 2015-09-21 DIAGNOSIS — Z01818 Encounter for other preprocedural examination: Secondary | ICD-10-CM | POA: Diagnosis present

## 2015-09-21 DIAGNOSIS — J449 Chronic obstructive pulmonary disease, unspecified: Secondary | ICD-10-CM | POA: Insufficient documentation

## 2015-09-21 DIAGNOSIS — R911 Solitary pulmonary nodule: Secondary | ICD-10-CM

## 2015-09-21 DIAGNOSIS — Z79899 Other long term (current) drug therapy: Secondary | ICD-10-CM | POA: Insufficient documentation

## 2015-09-21 DIAGNOSIS — E785 Hyperlipidemia, unspecified: Secondary | ICD-10-CM | POA: Insufficient documentation

## 2015-09-21 DIAGNOSIS — Z0183 Encounter for blood typing: Secondary | ICD-10-CM | POA: Diagnosis not present

## 2015-09-21 DIAGNOSIS — Z01812 Encounter for preprocedural laboratory examination: Secondary | ICD-10-CM | POA: Insufficient documentation

## 2015-09-21 HISTORY — DX: Personal history of other diseases of the respiratory system: Z87.09

## 2015-09-21 HISTORY — DX: Major depressive disorder, single episode, unspecified: F32.9

## 2015-09-21 HISTORY — DX: Reserved for inherently not codable concepts without codable children: IMO0001

## 2015-09-21 HISTORY — DX: Depression, unspecified: F32.A

## 2015-09-21 LAB — COMPREHENSIVE METABOLIC PANEL
ALT: 27 U/L (ref 17–63)
AST: 30 U/L (ref 15–41)
Albumin: 3.9 g/dL (ref 3.5–5.0)
Alkaline Phosphatase: 66 U/L (ref 38–126)
Anion gap: 13 (ref 5–15)
BUN: 10 mg/dL (ref 6–20)
CO2: 23 mmol/L (ref 22–32)
Calcium: 9.4 mg/dL (ref 8.9–10.3)
Chloride: 102 mmol/L (ref 101–111)
Creatinine, Ser: 1.02 mg/dL (ref 0.61–1.24)
GFR calc Af Amer: >60 mL/min (ref >60)
GFR calc non Af Amer: >60 mL/min (ref >60)
Glucose, Bld: 95 mg/dL (ref 65–99)
Potassium: 3.8 mmol/L (ref 3.5–5.1)
Sodium: 138 mmol/L (ref 135–145)
Total Bilirubin: 0.5 mg/dL (ref 0.3–1.2)
Total Protein: 7.1 g/dL (ref 6.5–8.1)

## 2015-09-21 LAB — URINALYSIS, ROUTINE W REFLEX MICROSCOPIC
Bilirubin Urine: NEGATIVE
Glucose, UA: 250 mg/dL — AB
Ketones, ur: NEGATIVE mg/dL
Leukocytes, UA: NEGATIVE
Nitrite: NEGATIVE
Protein, ur: NEGATIVE mg/dL
Specific Gravity, Urine: 1.019 (ref 1.005–1.030)
pH: 6 (ref 5.0–8.0)

## 2015-09-21 LAB — SURGICAL PCR SCREEN
MRSA, PCR: NEGATIVE
Staphylococcus aureus: NEGATIVE

## 2015-09-21 LAB — TYPE AND SCREEN
ABO/RH(D): O POS
Antibody Screen: NEGATIVE

## 2015-09-21 LAB — BLOOD GAS, ARTERIAL
Acid-Base Excess: 2.1 mmol/L — ABNORMAL HIGH (ref 0.0–2.0)
Bicarbonate: 26 mEq/L — ABNORMAL HIGH (ref 20.0–24.0)
Drawn by: 421801
FIO2: 0.21
O2 Saturation: 97.4 %
Patient temperature: 98.6
TCO2: 27.2 mmol/L (ref 0–100)
pCO2 arterial: 39 mmHg (ref 35.0–45.0)
pH, Arterial: 7.439 (ref 7.350–7.450)
pO2, Arterial: 96.3 mmHg (ref 80.0–100.0)

## 2015-09-21 LAB — CBC
HCT: 40.7 % (ref 39.0–52.0)
Hemoglobin: 13.6 g/dL (ref 13.0–17.0)
MCH: 32 pg (ref 26.0–34.0)
MCHC: 33.4 g/dL (ref 30.0–36.0)
MCV: 95.8 fL (ref 78.0–100.0)
Platelets: 242 10*3/uL (ref 150–400)
RBC: 4.25 MIL/uL (ref 4.22–5.81)
RDW: 12.3 % (ref 11.5–15.5)
WBC: 11.4 10*3/uL — ABNORMAL HIGH (ref 4.0–10.5)

## 2015-09-21 LAB — PROTIME-INR
INR: 1.1 (ref 0.00–1.49)
Prothrombin Time: 14.4 seconds (ref 11.6–15.2)

## 2015-09-21 LAB — APTT: aPTT: 33 seconds (ref 24–37)

## 2015-09-21 LAB — URINE MICROSCOPIC-ADD ON

## 2015-09-21 LAB — ABO/RH: ABO/RH(D): O POS

## 2015-09-21 NOTE — Pre-Procedure Instructions (Signed)
    John Bridges  09/21/2015      [facility] [address] - [place], [name] [address] Phone: [phone] Fax: [phone]  [facility] [place], [place] - [address] [name] [address] Phone: [phone] Fax: [phone]  [facility] [address] - [place], [place] [address] Phone: [phone] Fax: [phone]    Your procedure is scheduled on Monday, May 8th, 2017.  Report to [facility] Admitting at 5:30 A.M.   Call this number if you have problems the morning of surgery:  [phone]   Remember:  Do not eat food or drink liquids after midnight.   Take these medicines the morning of surgery with A SIP OF WATER: Amlodipine (Norvasc), Hydrocodone-Acetaminophen (Norco) if needed, Pantoprazole (Protonix), Tizanidine (Zanaflex) if needed.   Stop taking: Aspirin, NSAIDS, Aleve, Naproxen, Ibuprofen, Advil, Motrin, BC's, Goody's, Fish oil, all herbal medications, and all vitamins.    Do not wear jewelry.  Do not wear lotions, powders, or colognes.  You may NOT wear deodorant.  Men may shave face and neck.  Do not bring valuables to the hospital.   [facility] is not responsible for any belongings or valuables.  Contacts, dentures or bridgework may not be worn into surgery.  Leave your suitcase in the car.  After surgery it may be brought to your room.  For patients admitted to the hospital, discharge time will be determined by your treatment team.  Patients discharged the day of surgery will not be allowed to drive home.   Special instructions:  See attached.   Please read over the following fact sheets that you were given. Pain Booklet, Coughing and Deep Breathing, Blood Transfusion Information, MRSA Information and Surgical Site Infection Prevention

## 2015-09-21 NOTE — Progress Notes (Signed)
   09/21/15 1512  OBSTRUCTIVE SLEEP APNEA  Have you ever been diagnosed with sleep apnea through a sleep study? No  Do you snore loudly (loud enough to be heard through closed doors)?  1  Do you often feel tired, fatigued, or sleepy during the daytime (such as falling asleep during driving or talking to someone)? 0  Has anyone observed you stop breathing during your sleep? 0  Do you have, or are you being treated for high blood pressure? 1  BMI more than 35 kg/m2? 0  Age > 50 (1-yes) 1  Neck circumference greater than:Male 16 inches or larger, Male 17inches or larger? 1  Male Gender (Yes=1) 1  Obstructive Sleep Apnea Score 5

## 2015-09-21 NOTE — Progress Notes (Signed)
PCP- Neysa Hotter Cardiologist - Dr. Domenic Polite Pulmonologist - Dr. Chase Caller  EKG - 08/28/15 CXR - DOS  [id number] Stress test - 08/2015 Cardiac Cath - denies  Patient denies chest pain and acute shortness of breath at PAT appointment.  Patient states that he was seen recently for shortness of breath and was started on an antibiotic that was completed last week.  Patient states there have been no changes.  Patient encouraged to go ED or PCP if there becomes a change.

## 2015-09-22 NOTE — Progress Notes (Signed)
Anesthesia Chart Review:  Pt is a 60 year old male scheduled for video bronchoscopy, VATS, lung resection on 09/25/2015 with Dr. Servando Snare.   PMH includes:  HTN, hyperlipidemia, COPD, lung nodule. Former smoker. BMI 29  Medications include: amlodipine, lisinopril-hctz, protonix, pravastatin  Preoperative labs reviewed.    Chest X-ray will be obtained DOS.   EKG 08/28/15: sinus rhythm.   Nuclear stress test 08/31/15:   There was no ST segment deviation noted during stress.  The left ventricular ejection fraction is normal (55-65%).  Findings consistent with prior moderate inferior myocardial infarction with mild peri-infarct ischemia.  Overall low to intermediate risk study. Moderate size prior infarct with fairly mild amount of myocardium currently at jeopardy  Echo 08/31/15:  - Left ventricle: The cavity size was normal. Wall thickness was increased in a pattern of mild LVH. Systolic function was normal. The estimated ejection fraction was in the range of 55% to 60%. Wall motion was normal; there were no regional wall motion abnormalities. Left ventricular diastolic function parameters were normal. - Aortic valve: Mildly to moderately calcified annulus. Trileaflet; mildly thickened leaflets. Valve area (VTI): 2.36 cm^2. Valve area (Vmax): 2.32 cm^2. - Mitral valve: There was mild regurgitation. - Technically adequate study.  Pt saw Dr. Rozann Lesches for pre-op cardiology eval 08/28/15. Pt has cardiac clearance at low to intermediate perioperative risk in comment on stress test results.   If no changes, I anticipate pt can proceed with surgery as scheduled.   Willeen Cass, FNP-BC [facility]/Anesthesiology Phone: [phone] 09/22/2015 10:06 AM

## 2015-09-25 ENCOUNTER — Inpatient Hospital Stay (HOSPITAL_COMMUNITY): Payer: Medicare HMO | Admitting: Certified Registered"

## 2015-09-25 ENCOUNTER — Inpatient Hospital Stay (HOSPITAL_COMMUNITY): Payer: Medicare HMO

## 2015-09-25 ENCOUNTER — Encounter (HOSPITAL_COMMUNITY): Payer: Self-pay | Admitting: *Deleted

## 2015-09-25 ENCOUNTER — Inpatient Hospital Stay (HOSPITAL_COMMUNITY)
Admission: RE | Admit: 2015-09-25 | Discharge: 2015-09-30 | DRG: 164 | Disposition: A | Discharge status: Home / Self Care | Payer: Medicare HMO | Source: Ambulatory Visit | Attending: Cardiothoracic Surgery | Admitting: Cardiothoracic Surgery

## 2015-09-25 ENCOUNTER — Inpatient Hospital Stay (HOSPITAL_COMMUNITY): Payer: Medicare HMO | Admitting: Emergency Medicine

## 2015-09-25 ENCOUNTER — Encounter (HOSPITAL_COMMUNITY)
Admission: RE | Disposition: A | Discharge status: Home / Self Care | Payer: Self-pay | Source: Ambulatory Visit | Attending: Cardiothoracic Surgery

## 2015-09-25 DIAGNOSIS — R911 Solitary pulmonary nodule: Secondary | ICD-10-CM | POA: Diagnosis present

## 2015-09-25 DIAGNOSIS — T402X5A Adverse effect of other opioids, initial encounter: Secondary | ICD-10-CM | POA: Diagnosis not present

## 2015-09-25 DIAGNOSIS — Z87891 Personal history of nicotine dependence: Secondary | ICD-10-CM

## 2015-09-25 DIAGNOSIS — E785 Hyperlipidemia, unspecified: Secondary | ICD-10-CM | POA: Diagnosis present

## 2015-09-25 DIAGNOSIS — I10 Essential (primary) hypertension: Secondary | ICD-10-CM | POA: Diagnosis present

## 2015-09-25 DIAGNOSIS — J441 Chronic obstructive pulmonary disease with (acute) exacerbation: Secondary | ICD-10-CM | POA: Diagnosis present

## 2015-09-25 DIAGNOSIS — J939 Pneumothorax, unspecified: Secondary | ICD-10-CM | POA: Diagnosis not present

## 2015-09-25 DIAGNOSIS — J9811 Atelectasis: Secondary | ICD-10-CM | POA: Diagnosis present

## 2015-09-25 DIAGNOSIS — K5903 Drug induced constipation: Secondary | ICD-10-CM | POA: Diagnosis not present

## 2015-09-25 DIAGNOSIS — Z8249 Family history of ischemic heart disease and other diseases of the circulatory system: Secondary | ICD-10-CM | POA: Diagnosis not present

## 2015-09-25 DIAGNOSIS — R222 Localized swelling, mass and lump, trunk: Secondary | ICD-10-CM

## 2015-09-25 DIAGNOSIS — J449 Chronic obstructive pulmonary disease, unspecified: Secondary | ICD-10-CM | POA: Diagnosis not present

## 2015-09-25 DIAGNOSIS — Z823 Family history of stroke: Secondary | ICD-10-CM

## 2015-09-25 DIAGNOSIS — J8410 Pulmonary fibrosis, unspecified: Principal | ICD-10-CM | POA: Diagnosis present

## 2015-09-25 DIAGNOSIS — Z4682 Encounter for fitting and adjustment of non-vascular catheter: Secondary | ICD-10-CM

## 2015-09-25 DIAGNOSIS — R0902 Hypoxemia: Secondary | ICD-10-CM | POA: Diagnosis present

## 2015-09-25 DIAGNOSIS — R079 Chest pain, unspecified: Secondary | ICD-10-CM | POA: Diagnosis not present

## 2015-09-25 DIAGNOSIS — Z9689 Presence of other specified functional implants: Secondary | ICD-10-CM

## 2015-09-25 HISTORY — PX: VIDEO BRONCHOSCOPY: SHX5072

## 2015-09-25 HISTORY — PX: THORACOTOMY: SHX5074

## 2015-09-25 LAB — GLUCOSE, CAPILLARY
Glucose-Capillary: 127 mg/dL — ABNORMAL HIGH (ref 65–99)
Glucose-Capillary: 157 mg/dL — ABNORMAL HIGH (ref 65–99)
Glucose-Capillary: 170 mg/dL — ABNORMAL HIGH (ref 65–99)

## 2015-09-25 SURGERY — BRONCHOSCOPY, VIDEO-ASSISTED
Anesthesia: General | Site: Chest | Laterality: Right

## 2015-09-25 MED ORDER — PROPOFOL 10 MG/ML IV BOLUS
INTRAVENOUS | Status: DC | PRN
  Administered 2015-09-25: 70 mg via INTRAVENOUS
  Administered 2015-09-25: 150 mg via INTRAVENOUS

## 2015-09-25 MED ORDER — DEXTROSE 5 % IV SOLN
1.5000 g | INTRAVENOUS | Status: AC
  Administered 2015-09-25: 1.5 g via INTRAVENOUS
  Filled 2015-09-25: qty 1.5

## 2015-09-25 MED ORDER — KCL IN DEXTROSE-NACL 20-5-0.45 MEQ/L-%-% IV SOLN
INTRAVENOUS | Status: DC
  Administered 2015-09-25 (×2): via INTRAVENOUS
  Filled 2015-09-25 (×5): qty 1000

## 2015-09-25 MED ORDER — LIDOCAINE HCL (CARDIAC) 20 MG/ML IV SOLN
INTRAVENOUS | Status: DC | PRN
  Administered 2015-09-25: 80 mg via INTRAVENOUS

## 2015-09-25 MED ORDER — BUPIVACAINE HCL (PF) 0.25 % IJ SOLN
INTRAMUSCULAR | Status: AC
Start: 2015-09-25 — End: 2015-09-25
  Filled 2015-09-25: qty 10

## 2015-09-25 MED ORDER — ALBUTEROL SULFATE (2.5 MG/3ML) 0.083% IN NEBU
2.5000 mg | INHALATION_SOLUTION | RESPIRATORY_TRACT | Status: DC
  Administered 2015-09-25: 2.5 mg via RESPIRATORY_TRACT
  Administered 2015-09-25: 14:00:00 via RESPIRATORY_TRACT
  Administered 2015-09-25 – 2015-09-26 (×2): 2.5 mg via RESPIRATORY_TRACT
  Filled 2015-09-25 (×3): qty 3

## 2015-09-25 MED ORDER — PANTOPRAZOLE SODIUM 40 MG PO TBEC
40.0000 mg | DELAYED_RELEASE_TABLET | Freq: Every day | ORAL | Status: DC
  Administered 2015-09-26 – 2015-09-30 (×5): 40 mg via ORAL
  Filled 2015-09-25 (×5): qty 1

## 2015-09-25 MED ORDER — INSULIN ASPART 100 UNIT/ML ~~LOC~~ SOLN
0.0000 [IU] | Freq: Four times a day (QID) | SUBCUTANEOUS | Status: DC
  Administered 2015-09-25: 4 [IU] via SUBCUTANEOUS
  Administered 2015-09-25 – 2015-09-28 (×6): 2 [IU] via SUBCUTANEOUS

## 2015-09-25 MED ORDER — DEXTROSE 5 % IV SOLN
1.5000 g | Freq: Two times a day (BID) | INTRAVENOUS | Status: AC
  Administered 2015-09-26: 1.5 g via INTRAVENOUS
  Filled 2015-09-25: qty 1.5

## 2015-09-25 MED ORDER — ROCURONIUM BROMIDE 50 MG/5ML IV SOLN
INTRAVENOUS | Status: AC
  Filled 2015-09-25: qty 1

## 2015-09-25 MED ORDER — FENTANYL 40 MCG/ML IV SOLN
INTRAVENOUS | Status: DC
  Administered 2015-09-25: 12:00:00 via INTRAVENOUS
  Administered 2015-09-25: 60 ug via INTRAVENOUS
  Administered 2015-09-25: 150 ug via INTRAVENOUS
  Administered 2015-09-26: 90 ug via INTRAVENOUS
  Administered 2015-09-26: 45 ug via INTRAVENOUS
  Administered 2015-09-26: 60 ug via INTRAVENOUS
  Administered 2015-09-26: 15 ug via INTRAVENOUS
  Administered 2015-09-26: 105 ug via INTRAVENOUS
  Administered 2015-09-26: 60 ug via INTRAVENOUS
  Administered 2015-09-27: 30 ug via INTRAVENOUS
  Administered 2015-09-27: 13:00:00 via INTRAVENOUS
  Administered 2015-09-27: 45 ug via INTRAVENOUS
  Administered 2015-09-27 (×2): 75 ug via INTRAVENOUS
  Administered 2015-09-27: 90 ug via INTRAVENOUS
  Administered 2015-09-28: 75 ug via INTRAVENOUS
  Administered 2015-09-28: 45 ug via INTRAVENOUS
  Administered 2015-09-28: 30 ug via INTRAVENOUS
  Administered 2015-09-28 (×2): 75 ug via INTRAVENOUS
  Administered 2015-09-28: 120 ug via INTRAVENOUS
  Administered 2015-09-29: 15 ug via INTRAVENOUS
  Administered 2015-09-29: 30 ug via INTRAVENOUS
  Administered 2015-09-29: 105 ug via INTRAVENOUS
  Filled 2015-09-25 (×2): qty 25

## 2015-09-25 MED ORDER — ALBUTEROL SULFATE (2.5 MG/3ML) 0.083% IN NEBU
INHALATION_SOLUTION | RESPIRATORY_TRACT | Status: AC
  Filled 2015-09-25: qty 3

## 2015-09-25 MED ORDER — FENTANYL 40 MCG/ML IV SOLN
INTRAVENOUS | Status: AC
  Filled 2015-09-25: qty 25

## 2015-09-25 MED ORDER — NALOXONE HCL 0.4 MG/ML IJ SOLN: 0.4000 mg | INTRAMUSCULAR | Status: DC | PRN

## 2015-09-25 MED ORDER — FENTANYL CITRATE (PF) 100 MCG/2ML IJ SOLN
INTRAMUSCULAR | Status: DC | PRN
  Administered 2015-09-25 (×9): 50 ug via INTRAVENOUS
  Administered 2015-09-25: 150 ug via INTRAVENOUS
  Administered 2015-09-25 (×3): 50 ug via INTRAVENOUS

## 2015-09-25 MED ORDER — POTASSIUM CHLORIDE 10 MEQ/50ML IV SOLN: 10.0000 meq | Freq: Every day | INTRAVENOUS | Status: DC | PRN

## 2015-09-25 MED ORDER — PROPOFOL 10 MG/ML IV BOLUS
INTRAVENOUS | Status: AC
  Filled 2015-09-25: qty 20

## 2015-09-25 MED ORDER — ALBUTEROL SULFATE HFA 108 (90 BASE) MCG/ACT IN AERS
INHALATION_SPRAY | RESPIRATORY_TRACT | Status: DC | PRN
  Administered 2015-09-25 (×2): 2 via RESPIRATORY_TRACT

## 2015-09-25 MED ORDER — PHENYLEPHRINE HCL 10 MG/ML IJ SOLN
10.0000 mg | INTRAVENOUS | Status: DC | PRN
  Administered 2015-09-25: 40 ug/min via INTRAVENOUS
  Administered 2015-09-25: 30 ug/min via INTRAVENOUS

## 2015-09-25 MED ORDER — ONDANSETRON HCL 4 MG/2ML IJ SOLN: 4.0000 mg | Freq: Four times a day (QID) | INTRAMUSCULAR | Status: DC | PRN

## 2015-09-25 MED ORDER — BUPIVACAINE HCL (PF) 0.5 % IJ SOLN
INTRAMUSCULAR | Status: AC
  Filled 2015-09-25: qty 10

## 2015-09-25 MED ORDER — AMLODIPINE BESYLATE 5 MG PO TABS
5.0000 mg | ORAL_TABLET | Freq: Every day | ORAL | Status: DC
  Administered 2015-09-26 – 2015-09-27 (×2): 5 mg via ORAL
  Filled 2015-09-25 (×2): qty 1

## 2015-09-25 MED ORDER — ACETAMINOPHEN 160 MG/5ML PO SOLN
1000.0000 mg | Freq: Four times a day (QID) | ORAL | Status: DC
  Administered 2015-09-29: 1000 mg via ORAL
  Filled 2015-09-25: qty 40.6

## 2015-09-25 MED ORDER — DIPHENHYDRAMINE HCL 50 MG/ML IJ SOLN: 12.5000 mg | Freq: Four times a day (QID) | INTRAMUSCULAR | Status: DC | PRN

## 2015-09-25 MED ORDER — PRAVASTATIN SODIUM 40 MG PO TABS
40.0000 mg | ORAL_TABLET | Freq: Every day | ORAL | Status: DC
  Administered 2015-09-25 – 2015-09-29 (×5): 40 mg via ORAL
  Filled 2015-09-25 (×5): qty 1

## 2015-09-25 MED ORDER — LIDOCAINE 2% (20 MG/ML) 5 ML SYRINGE
INTRAMUSCULAR | Status: AC
  Filled 2015-09-25: qty 5

## 2015-09-25 MED ORDER — SODIUM CHLORIDE 0.9% FLUSH: 9.0000 mL | INTRAVENOUS | Status: DC | PRN

## 2015-09-25 MED ORDER — BUPIVACAINE ON-Q PAIN PUMP (FOR ORDER SET NO CHG)
INJECTION | Status: AC
  Filled 2015-09-25: qty 1

## 2015-09-25 MED ORDER — BISACODYL 5 MG PO TBEC
10.0000 mg | DELAYED_RELEASE_TABLET | Freq: Every day | ORAL | Status: DC
  Administered 2015-09-26 – 2015-09-28 (×2): 10 mg via ORAL
  Filled 2015-09-25 (×4): qty 2

## 2015-09-25 MED ORDER — ROCURONIUM BROMIDE 50 MG/5ML IV SOLN
INTRAVENOUS | Status: AC
  Filled 2015-09-25: qty 2

## 2015-09-25 MED ORDER — HYDROMORPHONE HCL 1 MG/ML IJ SOLN
0.2500 mg | INTRAMUSCULAR | Status: DC | PRN
  Administered 2015-09-25: 0.5 mg via INTRAVENOUS

## 2015-09-25 MED ORDER — ROCURONIUM BROMIDE 100 MG/10ML IV SOLN
INTRAVENOUS | Status: DC | PRN
  Administered 2015-09-25: 50 mg via INTRAVENOUS
  Administered 2015-09-25: 10 mg via INTRAVENOUS
  Administered 2015-09-25: 50 mg via INTRAVENOUS

## 2015-09-25 MED ORDER — SUGAMMADEX SODIUM 200 MG/2ML IV SOLN
INTRAVENOUS | Status: DC | PRN
Start: 2015-09-25 — End: 2015-09-25
  Administered 2015-09-25: 180 mg via INTRAVENOUS

## 2015-09-25 MED ORDER — ACETAMINOPHEN 500 MG PO TABS
1000.0000 mg | ORAL_TABLET | Freq: Four times a day (QID) | ORAL | Status: DC
  Administered 2015-09-25 – 2015-09-30 (×18): 1000 mg via ORAL
  Filled 2015-09-25 (×18): qty 2

## 2015-09-25 MED ORDER — ONDANSETRON HCL 4 MG/2ML IJ SOLN
INTRAMUSCULAR | Status: DC | PRN
  Administered 2015-09-25: 4 mg via INTRAVENOUS

## 2015-09-25 MED ORDER — MIDAZOLAM HCL 2 MG/2ML IJ SOLN
INTRAMUSCULAR | Status: AC
  Filled 2015-09-25: qty 2

## 2015-09-25 MED ORDER — BUPIVACAINE HCL (PF) 0.5 % IJ SOLN
INTRAMUSCULAR | Status: DC | PRN
Start: 2015-09-25 — End: 2015-09-25
  Administered 2015-09-25: 10 mL

## 2015-09-25 MED ORDER — FENTANYL CITRATE (PF) 250 MCG/5ML IJ SOLN
INTRAMUSCULAR | Status: AC
  Filled 2015-09-25: qty 5

## 2015-09-25 MED ORDER — SENNOSIDES-DOCUSATE SODIUM 8.6-50 MG PO TABS
1.0000 | ORAL_TABLET | Freq: Every day | ORAL | Status: DC
  Administered 2015-09-25 – 2015-09-27 (×3): 1 via ORAL
  Filled 2015-09-25 (×4): qty 1

## 2015-09-25 MED ORDER — HYDROMORPHONE HCL 1 MG/ML IJ SOLN
INTRAMUSCULAR | Status: AC
  Filled 2015-09-25: qty 1

## 2015-09-25 MED ORDER — BUPIVACAINE 0.25 % ON-Q PUMP SINGLE CATH 400 ML
400.0000 mL | INJECTION | Status: DC
  Filled 2015-09-25: qty 400

## 2015-09-25 MED ORDER — BUPIVACAINE 0.5 % ON-Q PUMP SINGLE CATH 400 ML
INJECTION | Status: DC | PRN
  Administered 2015-09-25: 400 mL

## 2015-09-25 MED ORDER — DIPHENHYDRAMINE HCL 12.5 MG/5ML PO ELIX: 12.5000 mg | ORAL_SOLUTION | Freq: Four times a day (QID) | ORAL | Status: DC | PRN

## 2015-09-25 MED ORDER — CEFUROXIME SODIUM 1.5 G IJ SOLR
1.5000 g | Freq: Two times a day (BID) | INTRAMUSCULAR | Status: DC
  Filled 2015-09-25: qty 1.5

## 2015-09-25 MED ORDER — MIDAZOLAM HCL 5 MG/5ML IJ SOLN
INTRAMUSCULAR | Status: DC | PRN
  Administered 2015-09-25: 2 mg via INTRAVENOUS

## 2015-09-25 MED ORDER — 0.9 % SODIUM CHLORIDE (POUR BTL) OPTIME
TOPICAL | Status: DC | PRN
  Administered 2015-09-25: 2000 mL

## 2015-09-25 MED ORDER — LACTATED RINGERS IV SOLN
INTRAVENOUS | Status: DC | PRN
  Administered 2015-09-25 (×2): via INTRAVENOUS

## 2015-09-25 MED ORDER — ALBUTEROL SULFATE HFA 108 (90 BASE) MCG/ACT IN AERS
1.0000 | INHALATION_SPRAY | Freq: Four times a day (QID) | RESPIRATORY_TRACT | Status: DC | PRN
Start: 2015-09-27 — End: 2015-09-25

## 2015-09-25 MED ORDER — BUPIVACAINE 0.5 % ON-Q PUMP SINGLE CATH 400 ML
400.0000 mL | INJECTION | Status: DC
  Filled 2015-09-25: qty 400

## 2015-09-25 MED ORDER — ALBUTEROL SULFATE (2.5 MG/3ML) 0.083% IN NEBU: 2.5000 mg | INHALATION_SOLUTION | Freq: Four times a day (QID) | RESPIRATORY_TRACT | Status: DC | PRN

## 2015-09-25 SURGICAL SUPPLY — 92 items
APPLICATOR TIP COSEAL (VASCULAR PRODUCTS) IMPLANT
APPLICATOR TIP EXT COSEAL (VASCULAR PRODUCTS) IMPLANT
BLADE SURG 11 STRL SS (BLADE) IMPLANT
BRUSH CYTOL CELLEBRITY 1.5X140 (MISCELLANEOUS) IMPLANT
CANISTER SUCTION 2500CC (MISCELLANEOUS) ×3 IMPLANT
CATH KIT ON Q 5IN SLV (PAIN MANAGEMENT) ×3 IMPLANT
CATH THORACIC 28FR (CATHETERS) ×3 IMPLANT
CATH THORACIC 36FR (CATHETERS) IMPLANT
CATH THORACIC 36FR RT ANG (CATHETERS) IMPLANT
CLIP TI MEDIUM 6 (CLIP) ×3 IMPLANT
CONN ST 1/4X3/8  BEN (MISCELLANEOUS) ×1
CONN ST 1/4X3/8 BEN (MISCELLANEOUS) ×2 IMPLANT
CONT SPEC 4OZ CLIKSEAL STRL BL (MISCELLANEOUS) ×12 IMPLANT
COVER TABLE BACK 60X90 (DRAPES) IMPLANT
DERMABOND ADVANCED (GAUZE/BANDAGES/DRESSINGS)
DERMABOND ADVANCED .7 DNX12 (GAUZE/BANDAGES/DRESSINGS) IMPLANT
DRAIN CHANNEL 28F RND 3/8 FF (WOUND CARE) ×3 IMPLANT
DRAIN CHANNEL 32F RND 10.7 FF (WOUND CARE) IMPLANT
DRAPE LAPAROSCOPIC ABDOMINAL (DRAPES) ×3 IMPLANT
DRAPE WARM FLUID 44X44 (DRAPE) ×3 IMPLANT
DRILL BIT 7/64X5 (BIT) IMPLANT
ELECT BLADE 4.0 EZ CLEAN MEGAD (MISCELLANEOUS) ×3
ELECT BLADE 6.5 EXT (BLADE) ×3 IMPLANT
ELECT REM PT RETURN 9FT ADLT (ELECTROSURGICAL) ×3
ELECTRODE BLDE 4.0 EZ CLN MEGD (MISCELLANEOUS) ×2 IMPLANT
ELECTRODE REM PT RTRN 9FT ADLT (ELECTROSURGICAL) ×2 IMPLANT
FORCEPS BIOP RJ4 1.8 (CUTTING FORCEPS) IMPLANT
GAUZE SPONGE 4X4 12PLY STRL (GAUZE/BANDAGES/DRESSINGS) ×3 IMPLANT
GLOVE BIO SURGEON STRL SZ 6.5 (GLOVE) ×15 IMPLANT
GLOVE BIOGEL PI IND STRL 6.5 (GLOVE) ×2 IMPLANT
GLOVE BIOGEL PI IND STRL 7.0 (GLOVE) ×2 IMPLANT
GLOVE BIOGEL PI INDICATOR 6.5 (GLOVE) ×1
GLOVE BIOGEL PI INDICATOR 7.0 (GLOVE) ×1
GLOVE SURG SS PI 7.0 STRL IVOR (GLOVE) ×12 IMPLANT
GOWN STRL REUS W/ TWL LRG LVL3 (GOWN DISPOSABLE) ×8 IMPLANT
GOWN STRL REUS W/TWL LRG LVL3 (GOWN DISPOSABLE) ×4
GOWN STRL REUS W/TWL XL LVL3 (GOWN DISPOSABLE) ×9 IMPLANT
KIT BASIN OR (CUSTOM PROCEDURE TRAY) ×3 IMPLANT
KIT CLEAN ENDO COMPLIANCE (KITS) ×3 IMPLANT
KIT ROOM TURNOVER OR (KITS) ×3 IMPLANT
KIT SUCTION CATH 14FR (SUCTIONS) IMPLANT
LIQUID BAND (GAUZE/BANDAGES/DRESSINGS) ×3 IMPLANT
MARKER SKIN DUAL TIP RULER LAB (MISCELLANEOUS) ×3 IMPLANT
NEEDLE BIOPSY TRANSBRONCH 21G (NEEDLE) IMPLANT
NS IRRIG 1000ML POUR BTL (IV SOLUTION) ×9 IMPLANT
OIL SILICONE PENTAX (PARTS (SERVICE/REPAIRS)) ×3 IMPLANT
PACK CHEST (CUSTOM PROCEDURE TRAY) ×3 IMPLANT
PAD ARMBOARD 7.5X6 YLW CONV (MISCELLANEOUS) ×9 IMPLANT
PASSER SUT SWANSON 36MM LOOP (INSTRUMENTS) ×3 IMPLANT
RELOAD GOLD ECHELON 45 (STAPLE) ×15 IMPLANT
SCISSORS LAP 5X35 DISP (ENDOMECHANICALS) IMPLANT
SEALANT PROGEL (MISCELLANEOUS) IMPLANT
SEALANT SURG COSEAL 4ML (VASCULAR PRODUCTS) IMPLANT
SEALANT SURG COSEAL 8ML (VASCULAR PRODUCTS) IMPLANT
SOLUTION ANTI FOG 6CC (MISCELLANEOUS) ×3 IMPLANT
SPONGE GAUZE 4X4 12PLY STER LF (GAUZE/BANDAGES/DRESSINGS) ×3 IMPLANT
STAPLER ECHELON POWERED (MISCELLANEOUS) ×3 IMPLANT
STRIP PERI DRY VERITAS 45 (STAPLE) ×9 IMPLANT
SUT PROLENE 3 0 SH DA (SUTURE) IMPLANT
SUT PROLENE 4 0 RB 1 (SUTURE)
SUT PROLENE 4-0 RB1 .5 CRCL 36 (SUTURE) IMPLANT
SUT SILK  1 MH (SUTURE) ×2
SUT SILK 1 MH (SUTURE) ×4 IMPLANT
SUT SILK 1 TIES 10X30 (SUTURE) IMPLANT
SUT SILK 2 0 SH (SUTURE) IMPLANT
SUT SILK 2 0SH CR/8 30 (SUTURE) IMPLANT
SUT SILK 3 0SH CR/8 30 (SUTURE) IMPLANT
SUT STEEL 1 (SUTURE) IMPLANT
SUT VIC AB 1 CTX 18 (SUTURE) ×3 IMPLANT
SUT VIC AB 1 CTX 36 (SUTURE) ×1
SUT VIC AB 1 CTX36XBRD ANBCTR (SUTURE) ×2 IMPLANT
SUT VIC AB 2-0 CTX 36 (SUTURE) ×3 IMPLANT
SUT VIC AB 2-0 UR6 27 (SUTURE) IMPLANT
SUT VIC AB 3-0 SH 8-18 (SUTURE) ×3 IMPLANT
SUT VIC AB 3-0 X1 27 (SUTURE) ×3 IMPLANT
SUT VICRYL 0 UR6 27IN ABS (SUTURE) IMPLANT
SUT VICRYL 2 TP 1 (SUTURE) ×3 IMPLANT
SWAB COLLECTION DEVICE MRSA (MISCELLANEOUS) IMPLANT
SYR 20ML ECCENTRIC (SYRINGE) ×3 IMPLANT
SYSTEM SAHARA CHEST DRAIN ATS (WOUND CARE) ×3 IMPLANT
TAPE CLOTH SURG 6X10 WHT LF (GAUZE/BANDAGES/DRESSINGS) ×3 IMPLANT
TAPE UMBILICAL COTTON 1/8X30 (MISCELLANEOUS) ×3 IMPLANT
TIP APPLICATOR SPRAY EXTEND 16 (VASCULAR PRODUCTS) IMPLANT
TOWEL OR 17X24 6PK STRL BLUE (TOWEL DISPOSABLE) IMPLANT
TOWEL OR 17X26 10 PK STRL BLUE (TOWEL DISPOSABLE) IMPLANT
TRAP SPECIMEN MUCOUS 40CC (MISCELLANEOUS) ×3 IMPLANT
TRAY FOLEY CATH 16FRSI W/METER (SET/KITS/TRAYS/PACK) ×3 IMPLANT
TROCAR XCEL BLUNT TIP 100MML (ENDOMECHANICALS) ×3 IMPLANT
TUBE ANAEROBIC SPECIMEN COL (MISCELLANEOUS) IMPLANT
TUBE CONNECTING 20X1/4 (TUBING) ×6 IMPLANT
TUNNELER SHEATH ON-Q 11GX8 DSP (PAIN MANAGEMENT) ×3 IMPLANT
WATER STERILE IRR 1000ML POUR (IV SOLUTION) ×6 IMPLANT

## 2015-09-25 NOTE — Progress Notes (Signed)
[address]       [place] [address]             [phone]                    John Bridges [facility] Medical Record [id number] Date of Birth: December 12, 1955  Referring:John Bridges Primary Care: John Bridges Cardiology:McDoweel, John Bridges  Chief Complaint:    Right Lung Mass   History of Present Illness:    John Bridges 60 y.o. male is seen in the office  today discovery new of a right upper lobe lung nodule 1.3 cm in size. The patient has been a long-term smoker more than 30 pack years. His wife is disabled with severe pulmonary disease and is followed by John. Chase Bridges. The patient had increasing shortness of breath and some coughing and was encouraged by his wife to go to see her doctors. Evaluation including pulmonary function studies, CT scan of the chest low dose and ultimately a PET scan was performed followed pulmonary department and the patient referred because of a new slightly  hypermetabolic 1.3 cm mass in the right upper lobe. The patient has stopped smoking 38 days ago.  The patient has had no previous cardiac history does have extensive coronary calcifications on his CT of the chest and has a strong family history of in his brothers of both coronary artery disease and stroke. He has seen John Bridges for preop clearance. He does have episodic symptoms of COPD flairs  with cough and sob. Most recent several weeks ago.       Current Activity/ Functional Status:  Patient is independent with mobility/ambulation, transfers, ADL's, IADL's. The patient is disabled because of rotator cuff and knee injuries, however he does care for his wife who is limited due to pulmonary disease   Zubrod Score: At the time of surgery this patient's most appropriate activity status/level should be described as: []     0    Normal activity, no symptoms [x]     1    Restricted in physical strenuous activity but ambulatory, able to do out light work []     2     Ambulatory and capable of self care, unable to do work activities, up and about               >50 % of waking hours                              []     3    Only limited self care, in bed greater than 50% of waking hours []     4    Completely disabled, no self care, confined to bed or chair []     5    Moribund   Past Medical History  Diagnosis Date  . Essential hypertension   . Hyperlipidemia   . Lung nodule     Right upper lobe lung nodule 1.3 cm   . COPD (chronic obstructive pulmonary disease) ([place])   . Shortness of breath dyspnea   . History of bronchitis   . Depression     Past Surgical History  Procedure Laterality Date  . 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 Hypertension Mother   .  Hypertension Father   . Cancer Brother   . CAD Brother   . Stroke Brother   Patient has one brother age 72 with lung cancer metastatic to the brain currently in hospice with 2 weeks estimated survival per the patient, one brother has had a stroke and is paralyzed, one brother recently had coronary stents placed  Social History   Social History  . Marital Status: Married    Spouse Name: N/A  . Number of Children: N/A  . Years of Education: N/A   Occupational History  . disabled    Social History Main Topics  . Smoking status: Former Smoker -- 1.00 packs/day for 40 years    Types: Cigarettes    Quit date: 08/16/2015  . Smokeless tobacco: Not on file  . Alcohol Use: 0.0 oz/week    0 Standard drinks or equivalent per week     Comment: 3-4 times a week  . Drug Use: No  . Sexual Activity: Not on file   Other Topics Concern  . Not on file   Social History Narrative    History  Smoking status  . Former Smoker -- 1.00 packs/day for 40 years  . Types: Cigarettes  . Quit date: 08/16/2015  Smokeless tobacco  . Not on file    History  Alcohol Use  . 0.0 oz/week    . 0 Standard drinks or equivalent per week    Comment: 3-4 times a week     No Known Allergies  Current Facility-Administered Medications  Medication Dose Route Frequency Provider Last Rate Last Dose  . cefUROXime (ZINACEF) 1.5 g in dextrose 5 % 50 mL IVPB  1.5 g Intravenous 60 min Pre-Op John Isaac, Bridges          Review of Systems:     Cardiac Review of Systems: Y or N  Chest Pain [  n  ]  Resting SOB [  n ] Exertional SOB  [ y ]  John Bridges  ]   Pedal Edema [ n  ]    Palpitations [ n ] Syncope  [  n]   Presyncope [ n  ]  General Review of Systems: [Y] = yes [  ]=no Constitional: recent weight change [ n ];  Wt loss over the last 3 months [ n  ] anorexia [  ]; fatigue [ n ]; nausea [  ]; night sweats [  ]; fever [n  ]; or chills [  ];          Dental: poor dentition[  ]; Last Dentist visit:   Eye : blurred vision [  ]; diplopia [   ]; vision changes [  ];  Amaurosis fugax[  ]; Resp: cough [ y ];  wheezing[ n ];  hemoptysis[n  ]; shortness of breath[n  ]; paroxysmal nocturnal dyspnea[n  ]; dyspnea on exertion[y  ]; or orthopnea[ n ];  GI:  gallstones[ n ], vomiting[  ];  dysphagia[  ]; melena[n  ];  hematochezia [  ]; heartburn[  ];   Hx of  Colonoscopy[n  ]; GU: kidney stones [  ]; hematuria[  ];   dysuria [  ];  nocturia[  ];  history of     obstruction [  ]; urinary frequency [  ]             Skin: rash, swelling[  ];, hair loss[  ];  peripheral edema[  ];  or itching[  ]; Musculosketetal: myalgias[  ];  joint swelling[  ];  joint erythema[  ];  joint pain[  ];  back pain[  ];  Heme/Lymph: bruising[n  ];  bleeding[  ];  anemia[  ];  Neuro: TIA[  ];  headaches[  ];  stroke[  ];  vertigo[  ];  seizures[  ];   paresthesias[n  ];  difficulty walking[n  ];  Psych:depression[n  ]; anxiety[ n ];  Endocrine: diabetes[n  ];  thyroid dysfunctionn[  ];  Immunizations: Flu up to date [  ]; Pneumococcal up to date [  ];  Other:  Physical Exam: BP 164/85 mmHg  Pulse 73  Temp(Src)  98.3 F (36.8 C)  Resp 20  Wt 190 lb (86.183 kg)  SpO2 97%  PHYSICAL EXAMINATION: General appearance: alert, cooperative, appears older than stated age and no distress Head: Normocephalic, without obvious abnormality, atraumatic Neck: no adenopathy, no carotid bruit, no JVD, supple, symmetrical, trachea midline and thyroid not enlarged, symmetric, no tenderness/mass/nodules Lymph nodes: Cervical, supraclavicular, and axillary nodes normal. Resp:distant breath sounds but no wheezing  Back: symmetric, no curvature. ROM normal. No CVA tenderness. Cardio: regular rate and rhythm, S1, S2 normal, no murmur, click, rub or gallop GI: soft, non-tender; bowel sounds normal; no masses,  no organomegaly Extremities: extremities normal, atraumatic, no cyanosis or edema and Homans sign is negative, no sign of DVT Neurologic: Grossly normal  Diagnostic Studies & Laboratory data:     Recent Radiology Findings:   Nm Pet Image Initial (pi) Skull Base To Thigh  08/14/2015  CLINICAL DATA:  Initial treatment strategy for right upper lobe nodule. EXAM: NUCLEAR MEDICINE PET SKULL BASE TO THIGH TECHNIQUE: 9.0 mCi F-18 FDG was injected intravenously. Full-ring PET imaging was performed from the skull base to thigh after the radiotracer. CT data was obtained and used for attenuation correction and anatomic localization. FASTING BLOOD GLUCOSE:  Value: 116 mg/dl COMPARISON:  Low-dose chest CT on 08/02/2015 FINDINGS: FINDINGS NECK No hypermetabolic lymph nodes in the neck. CHEST No hypermetabolic mediastinal or hilar nodes. An irregular solid appearing nodular density is seen in the posterior right lung apex measuring 13 mm on image 13 of series 6. This shows metabolic activity with SUV max of 3.1. No other suspicious pulmonary nodules identified. Mild emphysema noted. No evidence pleural effusion. ABDOMEN/PELVIS No abnormal hypermetabolic activity within the liver, pancreas, adrenal glands, or spleen. No hypermetabolic  lymph nodes in the abdomen or pelvis. SKELETON No focal hypermetabolic activity to suggest skeletal metastasis. IMPRESSION: 13 mm irregular solid nodular density in posterior right upper lobe apex is hypermetabolic with SUV max of 3.1. Early low-grade bronchogenic carcinoma cannot be excluded. No evidence of thoracic nodal or distant metastatic disease. Mild emphysema. Electronically Signed   By: Earle Gell M.D.   On: 08/14/2015 13:46   Ct Chest Lung Ca Screen Low Dose W/o Cm  08/03/2015  CLINICAL DATA:  60 year old male current smoker with 45 pack-year history of smoking, for initial lung cancer screening EXAM: CT CHEST WITHOUT CONTRAST LOW-DOSE FOR LUNG CANCER SCREENING TECHNIQUE: Multidetector CT imaging of the chest was performed following the standard protocol without IV contrast. COMPARISON:  None. FINDINGS: Mediastinum/Nodes: Heart is normal in size. No pericardial effusion. Coronary atherosclerosis involving the LAD and right coronary artery. Atherosclerotic calcifications of the aortic arch. Small mediastinal lymph nodes which do not meet pathologic CT size criteria, including a 7 mm short axis low right paratracheal node (series 2/ image 26) and a 7 mm short axis subcarinal node (series 2/image 30). While prominent  nodal soft tissue is suspected in the right hilar region (series 2/ image 32), this is poorly evaluated in the absence of intravenous contrast. Visualized thyroid is unremarkable. Lungs/Pleura: Irregular nodular opacity in the posterior right upper lobe (series 3/image 60), volumetric mean approximately 13.8 mm, with extension to the pleural surface (series 3/image 60). While unusual apical pleural-parenchymal scarring is possible, neoplasm is not excluded. By size criteria, this reflects a Lung Rads 4A lesion. The lungs are otherwise clear. Mild centrilobular and paraseptal emphysematous changes, upper lobe predominant. No focal consolidation. No pleural effusion or pneumothorax. Upper  abdomen: Visualized upper abdomen is unremarkable. Musculoskeletal: Visualized osseous structures are within normal limits. IMPRESSION: Irregular nodular opacity in the posterior right upper lobe, volumetric mean approximately 13.8 mm, with extension to the pleural surface. While unusual apical pleural-parenchymal scarring is possible, neoplasm is not excluded. By size criteria, this reflects a Lung Rads 4A lesion. Lung-RADS Category 4A, suspicious. Follow up low-dose chest CT without contrast in 3 months (please use the following order, "CT CHEST LCS NODULE FOLLOW-UP W/O CM") is recommended. Alternatively, PET may be considered. Electronically Signed   By: Julian Hy M.D.   On: 08/03/2015 11:29      I have independently reviewed the above  cath films and reviewed the findings with the  patient .   Recent Lab Findings: Lab Results  Component Value Date   WBC 11.4* 09/21/2015   HGB 13.6 09/21/2015   HCT 40.7 09/21/2015   PLT 242 09/21/2015   GLUCOSE 95 09/21/2015   ALT 27 09/21/2015   AST 30 09/21/2015   NA 138 09/21/2015   K 3.8 09/21/2015   CL 102 09/21/2015   CREATININE 1.02 09/21/2015   BUN 10 09/21/2015   CO2 23 09/21/2015   INR 1.10 09/21/2015   PFT's  Interpretation: 1. spirometry shows moderate obstructive ventilatory defect without significant bronchodilator improvement. 2.lung volumes show air trapping. 3.airway resistance is high. 4. DLCO mildly reduced. 5. consistent with COPD/asthma  FEV1 1.99 58% DLCO 22.86 77%  Assessment / Plan:   Patient with long-term smoking history presents with the new discovery of a 1.3 cm slightly hypermetabolic suspicious lesion in the right upper lobe, possibly clinical stage I lung cancer versus inflammatory nodule.  Strong family history of coronary artery disease and stroke in a patient with severely calcified coronaries on chest CT- has had recent Myoview stress test and cleared by Cardiology for surgery  I discussed with the  patient the possible diagnosis of carcinoma the lung and recommended  Proceeding  with bronchoscopy right video-assisted thoracoscopy wedge resection of right upper lobe lung lesion possible lobectomy and node dissection. The risks and options were discussed with the patient in detail and he is willing to proceed.    The goals risks and alternatives of the planned surgical procedure  bronchoscopy right video-assisted thoracoscopy wedge resection of right upper lobe lung lesion possible lobectomy and node dissection  have been discussed with the patient in detail. The risks of the procedure including death, infection, stroke, myocardial infarction, bleeding, blood transfusion have all been discussed specifically.  I have quoted John Bridges a 3 % of perioperative mortality and a complication rate as high as 30 %. The patient's questions have been answered.John Bridges is willing  to proceed with the planned procedure.  I  spent 40 minutes counseling the patient face to face and 50% or more the  time was spent in counseling and coordination of care. The total time spent  in the appointment was 60 minutes.  John Isaac Bridges      [address] [place] [address] Office [phone]   Beeper [phone]  09/25/2015 7:20 AM

## 2015-09-25 NOTE — Anesthesia Preprocedure Evaluation (Signed)
Anesthesia Evaluation  Patient identified by MRN, date of birth, ID band Patient awake    Reviewed: Allergy & Precautions, NPO status   Airway Mallampati: II  TM Distance: >3 FB Neck ROM: Full    Dental   Pulmonary shortness of breath, COPD, former smoker,    breath sounds clear to auscultation       Cardiovascular hypertension,  Rhythm:Regular Rate:Normal     Neuro/Psych    GI/Hepatic negative GI ROS, Neg liver ROS,   Endo/Other    Renal/GU negative Renal ROS     Musculoskeletal   Abdominal   Peds  Hematology   Anesthesia Other Findings   Reproductive/Obstetrics                             Anesthesia Physical Anesthesia Plan  ASA: III  Anesthesia Plan: General   Post-op Pain Management:    Induction:   Airway Management Planned: Double Lumen EBT  Additional Equipment:   Intra-op Plan:   Post-operative Plan: Possible Post-op intubation/ventilation  Informed Consent: I have reviewed the patients History and Physical, chart, labs and discussed the procedure including the risks, benefits and alternatives for the proposed anesthesia with the patient or authorized representative who has indicated his/her understanding and acceptance.   Dental advisory given  Plan Discussed with: CRNA and Anesthesiologist  Anesthesia Plan Comments:         Anesthesia Quick Evaluation

## 2015-09-25 NOTE — Brief Op Note (Addendum)
09/25/2015  10:46 AM  PATIENT:  John Bridges  60 y.o. male  PRE-OPERATIVE DIAGNOSIS:  Right upper lobe lung lesion   POST-OPERATIVE DIAGNOSIS:  Right upper lobe lung lesion , no malignancy   PROCEDURE:  VIDEO BRONCHOSCOPY, RIGHT VATS, RIGHT MINI THORACOTOMY WITH RIGHT UPPER LOBE WEDGE RESECTION, LYMPH NODE DISSECTION, and ON Q PLACEMENT  SURGEON:  Surgeon(s) and Role:    * Grace Isaac, MD - Primary  PHYSICIAN ASSISTANT: Lars Pinks PA_C  ANESTHESIA:   general  EBL:  Total I/O In: 1000 [I.V.:1000] Out: 300 [Urine:300]  BLOOD ADMINISTERED:none  DRAINS: 76 Blake drain and 28 French chest tube placed in the right pleural space   LOCAL MEDICATIONS USED:  MARCAINE     SPECIMEN:  Source of Specimen:  Wedge resection of RUL, multiple lymph nodes  DISPOSITION OF SPECIMEN:  PATHOLOGY. Frozen was negative for cancer and showed inflammation.  COUNTS CORRECT:  YES  DICTATION: .Dragon Dictation  PLAN OF CARE: Admit to inpatient   PATIENT DISPOSITION:  PACU - hemodynamically stable.   Delay start of Pharmacological VTE agent (>24hrs) due to surgical blood loss or risk of bleeding: yes

## 2015-09-25 NOTE — Progress Notes (Signed)
Patient ID: John Bridges, male   DOB: February 28, 1956, 60 y.o.   MRN: [id number]   SICU Evening Rounds:   Hemodynamically stable   sats 95% on [place] Pain under control  Urine output good  CT output low  CBC    Component Value Date/Time   WBC 11.4* 09/21/2015 1603   RBC 4.25 09/21/2015 1603   HGB 13.6 09/21/2015 1603   HCT 40.7 09/21/2015 1603   PLT 242 09/21/2015 1603   MCV 95.8 09/21/2015 1603   MCH 32.0 09/21/2015 1603   MCHC 33.4 09/21/2015 1603   RDW 12.3 09/21/2015 1603   LYMPHSABS 2.2 09/11/2013 1812   MONOABS 1.0 09/11/2013 1812   EOSABS 0.4 09/11/2013 1812   BASOSABS 0.0 09/11/2013 1812     BMET    Component Value Date/Time   NA 138 09/21/2015 1603   K 3.8 09/21/2015 1603   CL 102 09/21/2015 1603   CO2 23 09/21/2015 1603   GLUCOSE 95 09/21/2015 1603   BUN 10 09/21/2015 1603   CREATININE 1.02 09/21/2015 1603   CALCIUM 9.4 09/21/2015 1603   GFRNONAA >60 09/21/2015 1603   GFRAA >60 09/21/2015 1603     A/P:  Stable postop course. Continue current plans

## 2015-09-25 NOTE — Transfer of Care (Signed)
Immediate Anesthesia Transfer of Care Note  Patient: John Bridges  Procedure(s) Performed: Procedure(s): VIDEO BRONCHOSCOPY (N/A) MINI/LIMITED THORACOTOMY WITH RIGHT UPPER LOBE WEDGE RESECTION (Right)  Patient Location: PACU  Anesthesia Type:General  Level of Consciousness: awake, alert , oriented and patient cooperative  Airway & Oxygen Therapy: Patient Spontanous Breathing and Patient connected to face mask oxygen  Post-op Assessment: Report given to RN, Post -op Vital signs reviewed and stable and Patient moving all extremities X 4  Post vital signs: Reviewed and stable  Last Vitals:  Filed Vitals:   09/25/15 0500 09/25/15 1116  BP: 164/85 104/60  Pulse: 73 79  Temp: 36.8 C 36.4 C  Resp: 20 17    Last Pain: There were no vitals filed for this visit.       Complications: No apparent anesthesia complications

## 2015-09-25 NOTE — Anesthesia Procedure Notes (Addendum)
Procedure Name: Intubation Date/Time: 09/25/2015 7:42 AM Performed by: Lance Coon Pre-anesthesia Checklist: Patient identified, Timeout performed, Emergency Drugs available, Suction available and Patient being monitored Patient Re-evaluated:Patient Re-evaluated prior to inductionOxygen Delivery Method: Circle system utilized Preoxygenation: Pre-oxygenation with 100% oxygen Intubation Type: IV induction Ventilation: Mask ventilation without difficulty and Oral airway inserted - appropriate to patient size Laryngoscope Size: Sabra Heck and 2 Grade View: Grade I Tube type: Oral Tube size: 8.5 mm Number of attempts: 1 Airway Equipment and Method: Stylet Secured at: 21 cm Tube secured with: Tape Dental Injury: Teeth and Oropharynx as per pre-operative assessment    Procedure Name: Intubation Date/Time: 09/25/2015 8:00 AM Performed by: Lance Coon Pre-anesthesia Checklist: Patient identified, Timeout performed, Emergency Drugs available, Suction available and Patient being monitored Patient Re-evaluated:Patient Re-evaluated prior to inductionOxygen Delivery Method: Circle system utilized Preoxygenation: Pre-oxygenation with 100% oxygen Laryngoscope Size: Miller and 2 Grade View: Grade I Endobronchial tube: Left, Double lumen EBT, EBT position confirmed by auscultation and EBT position confirmed by fiberoptic bronchoscope and 37 Fr Number of attempts: 1 Airway Equipment and Method: Stylet and Fiberoptic brochoscope Placement Confirmation: ETT inserted through vocal cords under direct vision,  positive ETCO2 and breath sounds checked- equal and bilateral Tube secured with: Tape Dental Injury: Teeth and Oropharynx as per pre-operative assessment

## 2015-09-26 ENCOUNTER — Encounter (HOSPITAL_COMMUNITY): Payer: Self-pay | Admitting: Cardiothoracic Surgery

## 2015-09-26 ENCOUNTER — Inpatient Hospital Stay (HOSPITAL_COMMUNITY): Payer: Medicare HMO

## 2015-09-26 DIAGNOSIS — R911 Solitary pulmonary nodule: Secondary | ICD-10-CM

## 2015-09-26 DIAGNOSIS — R0902 Hypoxemia: Secondary | ICD-10-CM

## 2015-09-26 DIAGNOSIS — R079 Chest pain, unspecified: Secondary | ICD-10-CM

## 2015-09-26 DIAGNOSIS — J449 Chronic obstructive pulmonary disease, unspecified: Secondary | ICD-10-CM

## 2015-09-26 LAB — CBC
HCT: 32.8 % — ABNORMAL LOW (ref 39.0–52.0)
Hemoglobin: 10.8 g/dL — ABNORMAL LOW (ref 13.0–17.0)
MCH: 31.9 pg (ref 26.0–34.0)
MCHC: 32.9 g/dL (ref 30.0–36.0)
MCV: 96.8 fL (ref 78.0–100.0)
Platelets: 208 10*3/uL (ref 150–400)
RBC: 3.39 MIL/uL — ABNORMAL LOW (ref 4.22–5.81)
RDW: 12.5 % (ref 11.5–15.5)
WBC: 8.8 10*3/uL (ref 4.0–10.5)

## 2015-09-26 LAB — BLOOD GAS, ARTERIAL
Acid-Base Excess: 2.2 mmol/L — ABNORMAL HIGH (ref 0.0–2.0)
Bicarbonate: 26.8 mEq/L — ABNORMAL HIGH (ref 20.0–24.0)
Drawn by: 10006
O2 Content: 2 L/min
O2 Saturation: 94.2 %
Patient temperature: 98.6
TCO2: 28.2 mmol/L (ref 0–100)
pCO2 arterial: 45.6 mmHg — ABNORMAL HIGH (ref 35.0–45.0)
pH, Arterial: 7.386 (ref 7.350–7.450)
pO2, Arterial: 73.1 mmHg — ABNORMAL LOW (ref 80.0–100.0)

## 2015-09-26 LAB — BASIC METABOLIC PANEL
Anion gap: 10 (ref 5–15)
BUN: 8 mg/dL (ref 6–20)
CO2: 26 mmol/L (ref 22–32)
Calcium: 8.3 mg/dL — ABNORMAL LOW (ref 8.9–10.3)
Chloride: 102 mmol/L (ref 101–111)
Creatinine, Ser: 0.81 mg/dL (ref 0.61–1.24)
GFR calc Af Amer: >60 mL/min (ref >60)
GFR calc non Af Amer: >60 mL/min (ref >60)
Glucose, Bld: 133 mg/dL — ABNORMAL HIGH (ref 65–99)
Potassium: 3.6 mmol/L (ref 3.5–5.1)
Sodium: 138 mmol/L (ref 135–145)

## 2015-09-26 LAB — GLUCOSE, CAPILLARY
Glucose-Capillary: 109 mg/dL — ABNORMAL HIGH (ref 65–99)
Glucose-Capillary: 115 mg/dL — ABNORMAL HIGH (ref 65–99)
Glucose-Capillary: 125 mg/dL — ABNORMAL HIGH (ref 65–99)
Glucose-Capillary: 151 mg/dL — ABNORMAL HIGH (ref 65–99)
Glucose-Capillary: 87 mg/dL (ref 65–99)

## 2015-09-26 LAB — ACID FAST SMEAR (AFB, MYCOBACTERIA): Acid Fast Smear: NEGATIVE

## 2015-09-26 MED ORDER — POTASSIUM CHLORIDE 10 MEQ/50ML IV SOLN
10.0000 meq | INTRAVENOUS | Status: AC
  Administered 2015-09-26 (×3): 10 meq via INTRAVENOUS
  Filled 2015-09-26 (×3): qty 50

## 2015-09-26 MED ORDER — ENOXAPARIN SODIUM 30 MG/0.3ML ~~LOC~~ SOLN
30.0000 mg | SUBCUTANEOUS | Status: DC
  Administered 2015-09-26: 30 mg via SUBCUTANEOUS
  Filled 2015-09-26: qty 0.3

## 2015-09-26 MED ORDER — IPRATROPIUM-ALBUTEROL 0.5-2.5 (3) MG/3ML IN SOLN
3.0000 mL | Freq: Two times a day (BID) | RESPIRATORY_TRACT | Status: DC
  Administered 2015-09-26 – 2015-09-28 (×5): 3 mL via RESPIRATORY_TRACT
  Filled 2015-09-26 (×5): qty 3

## 2015-09-26 MED ORDER — ENOXAPARIN SODIUM 40 MG/0.4ML ~~LOC~~ SOLN
40.0000 mg | SUBCUTANEOUS | Status: DC
  Administered 2015-09-27 – 2015-09-29 (×3): 40 mg via SUBCUTANEOUS
  Filled 2015-09-26 (×4): qty 0.4

## 2015-09-26 MED ORDER — ALBUTEROL SULFATE (2.5 MG/3ML) 0.083% IN NEBU: 2.5000 mg | INHALATION_SOLUTION | RESPIRATORY_TRACT | Status: DC | PRN

## 2015-09-26 NOTE — Plan of Care (Signed)
Problem: Activity: Goal: Risk for activity intolerance will decrease Outcome: Progressing Walked 300 feet around unit with minimal assist.

## 2015-09-26 NOTE — Consult Note (Signed)
Name: John Bridges MRN: [id number] DOB: [birth year]    ADMISSION DATE:  09/25/2015 CONSULTATION DATE:  09/26/2015  REFERRING MD :  CVTS  CHIEF COMPLAINT:  COPD exacerbation.  BRIEF PATIENT DESCRIPTION: 60 year old male with COPD history who presents with the CVTS service for a pulmonary nodule resection.  Patient had a recent COPD exacerbation that was treated with doxy and a prednisone taper.  Patient quit smoking 38 days ago and is starting to feel better from a respiratory standpoint.  Was not on home O2 and was only using PRN albuterol.  Moderate COPD with FEV1 of 64%.  Had a hypermetabolic nodule on PET and underwent a wedge resection.  Currently only complaining of soreness at the surgical side but no further respiratory complaints.  SIGNIFICANT EVENTS  5/8 wedge resection of hot pulmonary nodule and CT in place.  STUDIES:  PET with hypermetabolic pulmonary nodule.   HISTORY OF PRESENT ILLNESS:  60 year old male with COPD history who presents with the CVTS service for a pulmonary nodule resection.  Patient had a recent COPD exacerbation that was treated with doxy and a prednisone taper.  Patient quit smoking 38 days ago and is starting to feel better from a respiratory standpoint.  Was not on home O2 and was only using PRN albuterol.  Moderate COPD with FEV1 of 64%.  Had a hypermetabolic nodule on PET and underwent a wedge resection.  Currently only complaining of soreness at the surgical side but no further respiratory complaints.  PAST MEDICAL HISTORY :   has a past medical history of Essential hypertension; Hyperlipidemia; Lung nodule; COPD (chronic obstructive pulmonary disease) ([place]); Shortness of breath dyspnea; History of bronchitis; and Depression.  has past surgical history that includes Leg Surgery (Left); Tooth extraction; and Colonoscopy. Prior to Admission medications   Medication Sig Start Date End Date Taking? Authorizing Provider  amLODipine (NORVASC) 10 MG tablet Take  10 mg by mouth daily.   Yes Historical Provider, MD  HYDROcodone-acetaminophen (NORCO) 7.5-325 MG tablet Take 1 tablet by mouth every 4 (four) hours as needed for moderate pain (Must last 30 days.  Do not drive or operate machinery while taking this medicine.). 08/31/15  Yes Sanjuana Kava, MD  lisinopril-hydrochlorothiazide (PRINZIDE,ZESTORETIC) 20-12.5 MG per tablet Take 1 tablet by mouth daily.   Yes Historical Provider, MD  pantoprazole (PROTONIX) 40 MG tablet Take 1 tablet (40 mg total) by mouth daily. 09/11/13  Yes Milton Ferguson, MD  pravastatin (PRAVACHOL) 40 MG tablet Take 40 mg by mouth daily.   Yes Historical Provider, MD  tiZANidine (ZANAFLEX) 4 MG tablet Take 4 mg by mouth every 6 (six) hours as needed for muscle spasms.   Yes Historical Provider, MD  tiZANidine (ZANAFLEX) 4 MG tablet TAKE 1 TABLET BY MOUTH EVERY 8 HOURS AS NEEDED SPASMS 09/07/15  Yes Sanjuana Kava, MD   No Known Allergies  FAMILY HISTORY:  family history includes CAD in his brother; Cancer in his brother; Hypertension in his father and mother; Stroke in his brother. SOCIAL HISTORY:  reports that he quit smoking about 5 weeks ago. His smoking use included Cigarettes. He has a 40 pack-year smoking history. He does not have any smokeless tobacco history on file. He reports that he drinks alcohol. He reports that he does not use illicit drugs.  REVIEW OF SYSTEMS:   Constitutional: Negative for fever, chills, weight loss, malaise/fatigue and diaphoresis.  HENT: Negative for hearing loss, ear pain, nosebleeds, congestion, sore throat, neck pain, tinnitus and  ear discharge.   Eyes: Negative for blurred vision, double vision, photophobia, pain, discharge and redness.  Respiratory: Negative for hemoptysis, shortness of breath, wheezing and stridor.  Positive for cough and sputum production that is improving.  Chest pain at the surgical site. Cardiovascular: Negative for chest pain, palpitations, orthopnea, claudication, leg  swelling and PND.  Gastrointestinal: Negative for heartburn, nausea, vomiting, abdominal pain, diarrhea, constipation, blood in stool and melena.  Genitourinary: Negative for dysuria, urgency, frequency, hematuria and flank pain.  Musculoskeletal: Negative for myalgias, back pain, joint pain and falls.  Skin: Negative for itching and rash.  Neurological: Negative for dizziness, tingling, tremors, sensory change, speech change, focal weakness, seizures, loss of consciousness, weakness and headaches.  Endo/Heme/Allergies: Negative for environmental allergies and polydipsia. Does not bruise/bleed easily.  SUBJECTIVE: CP at the surgical site.  VITAL SIGNS: Temp:  [97.3 F (36.3 C)-99.1 F (37.3 C)] 97.3 F (36.3 C) (05/09 0800) Pulse Rate:  [71-94] 81 (05/09 1000) Resp:  [10-22] 15 (05/09 1000) BP: (101-139)/(52-89) 133/73 mmHg (05/09 1000) SpO2:  [89 %-100 %] 99 % (05/09 1000) Arterial Line BP: (84-140)/(48-103) 84/73 mmHg (05/08 1600) Weight:  [86.1 kg (189 lb 13.1 oz)-86.2 kg (190 lb 0.6 oz)] 86.2 kg (190 lb 0.6 oz) (05/09 0523)  PHYSICAL EXAMINATION: General:  Well appearing, mild painful distress. Neuro:  Alert and interactive, moving all ext to command. HEENT:  [place]/AT, PERRL, EOM-I and MMM. Cardiovascular:  RRR, Nl S1/S2, -M/R/G. Lungs:  Distant but clear. Abdomen:  Soft, NT, ND and +BS. Musculoskeletal:  -edema and -tenderness. Skin:  Intact, surgical site clean.   Recent Labs Lab 09/21/15 1603 09/26/15 0410  NA 138 138  K 3.8 3.6  CL 102 102  CO2 23 26  BUN 10 8  CREATININE 1.02 0.81  GLUCOSE 95 133*    Recent Labs Lab 09/21/15 1603 09/26/15 0410  HGB 13.6 10.8*  HCT 40.7 32.8*  WBC 11.4* 8.8  PLT 242 208   Dg Chest 2 View  09/25/2015  CLINICAL DATA:  Cough and congestion. EXAM: CHEST  2 VIEW COMPARISON:  CT 08/02/2015 FINDINGS: The right upper lobe nodule is not conclusively visible, probably superimposed on the first rib. The lungs are clear. The pulmonary  vasculature is normal. Hilar and mediastinal contours are unremarkable and unchanged. No large effusions. IMPRESSION: No acute cardiopulmonary findings. Electronically Signed   By: Andreas Newport M.D.   On: 09/25/2015 06:16   Dg Chest Port 1 View  09/26/2015  CLINICAL DATA:  Status post mini thoracotomy with right upper lobe wedge resection of a mass. EXAM: PORTABLE CHEST 1 VIEW COMPARISON:  Portable chest x-ray of Sep 25, 2015 FINDINGS: The lungs are adequately inflated. On the right no pneumothorax is evident. Decreased density in the right pulmonary apex. The 2 right chest tubes are in stable position. The interstitial markings of both lungs remain mildly increased. There is no pleural effusion. The heart is top-normal in size. The pulmonary vascularity is not engorged. The right internal jugular venous catheter tip projects over the proximal SVC. IMPRESSION: Stable mild interstitial prominence which may reflect low-grade interstitial edema or chronic bronchitic changes. No CHF, pneumothorax, or significant pleural effusion. Decreased density in the right pulmonary apex likely reflecting resolving postsurgical edema or atelectasis. The support tubes are in stable position. Electronically Signed   By: David  Martinique M.D.   On: 09/26/2015 07:35   Dg Chest Port 1 View  09/25/2015  CLINICAL DATA:  Post bronchoscopy today EXAM: PORTABLE CHEST 1 VIEW  COMPARISON:  09/25/2015 FINDINGS: Cardiomediastinal silhouette is stable. Two right chest tubes are noted. No pulmonary edema. There is no pneumothorax. There is streaky right upper lobe atelectasis or infiltrate. Right IJ central line with tip in upper SVC. No pulmonary edema. IMPRESSION: Right chest tubes. No pneumothorax. Streaky right upper lobe atelectasis or infiltrate. No pulmonary edema. Right IJ central line in place. Electronically Signed   By: Lahoma Crocker M.D.   On: 09/25/2015 11:44   I reviewed CXR myself, CT in place.  ASSESSMENT / PLAN:  60 year old  male with moderate COPD presenting post op from a wedge resection from a pulmonary nodule excision.  Discussed with PCCM-NP.  Pulmonary nodule:  - Resection complete.  - F/U on pathology prelim negative for malignancy but areas of fibrosis and granulomas.  Chest pain:  - Narcotics as ordered to combat stenting.  COPD:  - Albuterol PRN.  - O2 as needed.  - No need for steroids, not wheezing, will also help with wound healing.  Hypoxemia:  - Titrate O2 to off, do not think patient will need it.  - IS to combat atelectasis.  - Narcs for CP to avoid stenting.  Post op chest tube:  - CT to water seal.  PCCM will follow.  Rush Farmer, M.D. [facility] Pulmonary/Critical Care Medicine. Pager: [phone]. After hours pager: [phone].  09/26/2015, 11:23 AM

## 2015-09-26 NOTE — Progress Notes (Signed)
Patient ID: John Bridges, male   DOB: 03-02-1956, 60 y.o.   MRN: [id number] EVENING ROUNDS NOTE :     [address]       [place] [address]             [phone]                 1 Day Post-Op Procedure(s) (LRB): VIDEO BRONCHOSCOPY (N/A) MINI/LIMITED THORACOTOMY WITH RIGHT UPPER LOBE WEDGE RESECTION (Right)  Total Length of Stay:  LOS: 1 day  BP 136/67 mmHg  Pulse 92  Temp(Src) 98.9 F (37.2 C) (Oral)  Resp 16  Ht 5\' 8"  (1.727 m)  Wt 190 lb 0.6 oz (86.2 kg)  BMI 28.90 kg/m2  SpO2 98%  .Intake/Output      05/09 0701 - 05/10 0700   P.O. 840   I.V. (mL/kg) 217.5 (2.5)   IV Piggyback 50   Total Intake(mL/kg) 1107.5 (12.8)   Urine (mL/kg/hr) 375 (0.3)   Blood    Chest Tube 215 (0.2)   Total Output 590   Net +517.5       Urine Occurrence 2 x     . bupivacaine ON-Q pain pump    . dextrose 5 % and 0.45 % NaCl with KCl 20 mEq/L 10 mL/hr at 09/26/15 1800     Lab Results  Component Value Date   WBC 8.8 09/26/2015   HGB 10.8* 09/26/2015   HCT 32.8* 09/26/2015   PLT 208 09/26/2015   GLUCOSE 133* 09/26/2015   ALT 27 09/21/2015   AST 30 09/21/2015   NA 138 09/26/2015   K 3.6 09/26/2015   CL 102 09/26/2015   CREATININE 0.81 09/26/2015   BUN 8 09/26/2015   CO2 26 09/26/2015   INR 1.10 09/21/2015   Mild abdominal distention ambulating Final path no Violet Baldy MD  Beeper [phone] Office [phone] 09/26/2015 8:41 PM

## 2015-09-26 NOTE — Progress Notes (Signed)
Patient ID: John Bridges, male   DOB: December 06, 1955, 60 y.o.   MRN: [id number] TCTS DAILY ICU PROGRESS NOTE                   [address]            [place] [address]          [phone]   1 Day Post-Op Procedure(s) (LRB): VIDEO BRONCHOSCOPY (N/A) MINI/LIMITED THORACOTOMY WITH RIGHT UPPER LOBE WEDGE RESECTION (Right)  Total Length of Stay:  LOS: 1 day   Subjective: Walked around unit this am  Objective: Vital signs in last 24 hours: Temp:  [97.6 F (36.4 C)-99.1 F (37.3 C)] 97.9 F (36.6 C) (05/09 0416) Pulse Rate:  [71-94] 76 (05/09 0700) Cardiac Rhythm:  [-] Normal sinus rhythm (05/09 0400) Resp:  [10-22] 12 (05/09 0700) BP: (101-139)/(52-89) 114/70 mmHg (05/09 0700) SpO2:  [89 %-100 %] 98 % (05/09 0700) Arterial Line BP: (84-140)/(48-103) 84/73 mmHg (05/08 1600) Weight:  [189 lb 13.1 oz (86.1 kg)-190 lb 0.6 oz (86.2 kg)] 190 lb 0.6 oz (86.2 kg) (05/09 0523)  Filed Weights   09/25/15 0500 09/25/15 1345 09/26/15 0523  Weight: 190 lb (86.183 kg) 189 lb 13.1 oz (86.1 kg) 190 lb 0.6 oz (86.2 kg)    Weight change: -2.9 oz (-0.083 kg)   Hemodynamic parameters for last 24 hours:    Intake/Output from previous day: 05/08 0701 - 05/09 0700 In: 4418.3 [P.O.:1510; I.V.:2808.3; IV Piggyback:100] Out: 3265 [Urine:2735; Blood:100; Chest Tube:430]  Intake/Output this shift:    Current Meds: Scheduled Meds: . acetaminophen  1,000 mg Oral Q6H   Or  . acetaminophen (TYLENOL) oral liquid 160 mg/5 mL  1,000 mg Oral Q6H  . albuterol  2.5 mg Nebulization Q4H while awake  . amLODipine  5 mg Oral Daily  . bisacodyl  10 mg Oral Daily  . cefUROXime (ZINACEF)  IV  1.5 g Intravenous Q12H  . enoxaparin (LOVENOX) injection  30 mg Subcutaneous Q24H  . fentaNYL   Intravenous Q4H  . insulin aspart  0-24 Units Subcutaneous Q6H  . pantoprazole  40 mg Oral Daily  . potassium chloride  10 mEq Intravenous Q1 Hr x 3  . pravastatin  40 mg Oral q1800  . senna-docusate  1  tablet Oral QHS   Continuous Infusions: . bupivacaine ON-Q pain pump    . dextrose 5 % and 0.45 % NaCl with KCl 20 mEq/L 100 mL/hr at 09/26/15 0600   PRN Meds:.albuterol, diphenhydrAMINE **OR** diphenhydrAMINE, naloxone **AND** sodium chloride flush, ondansetron (ZOFRAN) IV, ondansetron (ZOFRAN) IV, potassium chloride  General appearance: alert, cooperative and no distress Neurologic: intact Heart: regular rate and rhythm, S1, S2 normal, no murmur, click, rub or gallop Lungs: diminished breath sounds bibasilar Abdomen: soft, non-tender; bowel sounds normal; no masses,  no organomegaly Extremities: extremities normal, atraumatic, no cyanosis or edema and Homans sign is negative, no sign of DVT Wound: no air leak from chest tube  Lab Results: CBC: Recent Labs  09/26/15 0410  WBC 8.8  HGB 10.8*  HCT 32.8*  PLT 208   BMET:  Recent Labs  09/26/15 0410  NA 138  K 3.6  CL 102  CO2 26  GLUCOSE 133*  BUN 8  CREATININE 0.81  CALCIUM 8.3*    PT/INR: No results for input(s): LABPROT, INR in the last 72 hours. Radiology: Dg Chest Port 1 View  09/25/2015  CLINICAL DATA:  Post bronchoscopy today EXAM: PORTABLE CHEST 1 VIEW COMPARISON:  09/25/2015 FINDINGS: Cardiomediastinal silhouette is stable. Two right chest tubes are noted. No pulmonary edema. There is no pneumothorax. There is streaky right upper lobe atelectasis or infiltrate. Right IJ central line with tip in upper SVC. No pulmonary edema. IMPRESSION: Right chest tubes. No pneumothorax. Streaky right upper lobe atelectasis or infiltrate. No pulmonary edema. Right IJ central line in place. Electronically Signed   By: Lahoma Crocker M.D.   On: 09/25/2015 11:44     Assessment/Plan: S/P Procedure(s) (LRB): VIDEO BRONCHOSCOPY (N/A) MINI/LIMITED THORACOTOMY WITH RIGHT UPPER LOBE WEDGE RESECTION (Right) Mobilize Diuresis Plan for transfer to step-down: see transfer orders D/c foley Ct to water seal  Grace Isaac 09/26/2015 7:36  AM

## 2015-09-26 NOTE — Anesthesia Postprocedure Evaluation (Signed)
Anesthesia Post Note  Patient: John Bridges  Procedure(s) Performed: Procedure(s) (LRB): VIDEO BRONCHOSCOPY (N/A) MINI/LIMITED THORACOTOMY WITH RIGHT UPPER LOBE WEDGE RESECTION (Right)  Patient location during evaluation: PACU Anesthesia Type: General Level of consciousness: awake Pain management: pain level controlled Vital Signs Assessment: post-procedure vital signs reviewed and stable Respiratory status: spontaneous breathing Cardiovascular status: stable Anesthetic complications: no    Last Vitals:  Filed Vitals:   09/26/15 1500 09/26/15 1553  BP: 141/71   Pulse: 87   Temp:  37.9 C  Resp: 11 19    Last Pain:  Filed Vitals:   09/26/15 1626  PainSc: 1                  EDWARDS,Zuha Dejonge

## 2015-09-27 ENCOUNTER — Inpatient Hospital Stay (HOSPITAL_COMMUNITY): Payer: Medicare HMO

## 2015-09-27 LAB — COMPREHENSIVE METABOLIC PANEL
ALT: 19 U/L (ref 17–63)
AST: 22 U/L (ref 15–41)
Albumin: 2.8 g/dL — ABNORMAL LOW (ref 3.5–5.0)
Alkaline Phosphatase: 54 U/L (ref 38–126)
Anion gap: 9 (ref 5–15)
BUN: 6 mg/dL (ref 6–20)
CO2: 27 mmol/L (ref 22–32)
Calcium: 8.6 mg/dL — ABNORMAL LOW (ref 8.9–10.3)
Chloride: 103 mmol/L (ref 101–111)
Creatinine, Ser: 0.69 mg/dL (ref 0.61–1.24)
GFR calc Af Amer: >60 mL/min (ref >60)
GFR calc non Af Amer: >60 mL/min (ref >60)
Glucose, Bld: 114 mg/dL — ABNORMAL HIGH (ref 65–99)
Potassium: 3.8 mmol/L (ref 3.5–5.1)
Sodium: 139 mmol/L (ref 135–145)
Total Bilirubin: 0.7 mg/dL (ref 0.3–1.2)
Total Protein: 5.7 g/dL — ABNORMAL LOW (ref 6.5–8.1)

## 2015-09-27 LAB — GLUCOSE, CAPILLARY
Glucose-Capillary: 126 mg/dL — ABNORMAL HIGH (ref 65–99)
Glucose-Capillary: 123 mg/dL — ABNORMAL HIGH (ref 65–99)
Glucose-Capillary: 109 mg/dL — ABNORMAL HIGH (ref 65–99)

## 2015-09-27 LAB — CBC
HCT: 32.1 % — ABNORMAL LOW (ref 39.0–52.0)
Hemoglobin: 10.7 g/dL — ABNORMAL LOW (ref 13.0–17.0)
MCH: 32.9 pg (ref 26.0–34.0)
MCHC: 33.3 g/dL (ref 30.0–36.0)
MCV: 98.8 fL (ref 78.0–100.0)
Platelets: 204 10*3/uL (ref 150–400)
RBC: 3.25 MIL/uL — ABNORMAL LOW (ref 4.22–5.81)
RDW: 12.4 % (ref 11.5–15.5)
WBC: 10.5 10*3/uL (ref 4.0–10.5)

## 2015-09-27 NOTE — Op Note (Signed)
NAMELABRANDON, Bridges NO.:  [id number]  MEDICAL RECORD NO.:  [id number]  LOCATION:  2S03C                        FACILITY:  [facility]  PHYSICIAN:  Lanelle Bal, MD    DATE OF BIRTH:  06-26-1955  DATE OF PROCEDURE:  09/25/2015 DATE OF DISCHARGE:                              OPERATIVE REPORT   PREOPERATIVE DIAGNOSIS:  Right upper lobe hypermetabolic lung lesion.  POSTOPERATIVE DIAGNOSIS:  Right upper lobe hypermetabolic lung lesion. No malignancy identified by frozen section.  PROCEDURE PERFORMED:  Bronchoscopy, right video-assisted thoracoscopy, mini thoracotomy, wedge resection of right upper lobe, lymph node sampling, placement of On-Q device.  SURGEON:  Lanelle Bal, MD.  FIRST ASSISTANT:  Lars Pinks, PA.  BRIEF HISTORY:  The patient is a 60 year old, heavy smoker until 38 days ago who was referred by Dr. Chase Caller for surgical resection of right upper lobe lung nodule.  The patient has had increasing cough and respiratory symptoms and sought medical attention from the Pulmonary Service.  This led to a chest x-ray, CT scan, and PET scan.  There was approximately 1 cm right upper lobe lung nodule at the periphery which was evident and mildly hypermetabolic.  There was no evidence of hypermetabolic mediastinal nodes.  The patient had some decrease in pulmonary function studies, but not severe.  However, he does have intermittent exacerbations of COPD, and several weeks prior to surgery, was treated with steroids and tetracycline by Dr. Chase Caller.  Because of the patient's high risk for lung cancer, it was recommended to him that we proceed with video-assisted thoracoscopy and surgical resection.  The patient agreed and signed informed consent.  It should be noted that since he was seen in the office to discuss surgical resection, he is maintaining being tobacco free.  DESCRIPTION OF PROCEDURE:  With central line and arterial line in place, the  patient underwent general endotracheal anesthesia.  Appropriate time- out was performed.  A single lumen endotracheal tube had been placed. Using video bronchoscope, bronchoscopy was performed to the subsegmental level with no endobronchial lesions.  Although the patient complains secretions that he cannot cough up, there was tracheobronchial tree at this exam was free of any significant secretions.  The scope was removed.  A double-lumen endotracheal tube was placed.  The patient was then turned in lateral decubitus position with the right side up. Preoperatively, the right side had been marked.  The right chest was prepped with Betadine and draped in usual sterile manner.  A small incision was made approximately at 4th intercostal space through this port incision and with the right lung collapsed, a 30-degree videoscope was introduced in the pleural space.  I explored from the surface of the lung, the exact location of the area in question was not evident and this port site was enlarged slightly.  A 2nd port site was made slightly inferior, and through these 2 incisions, the lung was manipulated until we could palpate in the periphery of the right upper lobe area in question.  Using the ParaGard strips on a 45 mm stapler, a wedge resection was performed with a 2 cm margin in the area of firmness in the resected specimen.  Specimen  was submitted to Pathology.  The specimen was not cut open in the operating room and left intact.  While waiting for the frozen section, we did dissect out 4R and 10 lymph nodes, submitted separately to Pathology.  The initial frozen section showed negative margins and no evidence of cancer pathologist described histiocytic inflammation.  With this information, we went back to the lungs expanding the incision slightly and further carefully palpated the upper lobe to make sure that there were no other lesions palpable which there were not.  An On-Q device was  tunneled subpleurally along the posterior ribs, two 28 chest tubes and a Blake drain posteriorly and a standard chest tube anteriorly were placed into the pleural space.  A mini-thoracotomy incisions were then closed with interrupted 0 sutures in the deep layers, running 2-0 Vicryl on the subcutaneous tissue, and 3- 0 subcuticular stitch in skin edges.  The lung had reinflated nicely, there was no air leak at the completion of the procedure.  The patient tolerated the procedure without complication.  Blood loss was minimal. He was extubated in the operating room and transferred to the recovery room for postoperative care.  Sponge and needle count was reported as correct at the completion of the procedure.     Lanelle Bal, MD     EG/MEDQ  D:  09/26/2015  T:  09/27/2015  Job:  [id number]  cc:   John Males, MD

## 2015-09-27 NOTE — Progress Notes (Signed)
Patient ID: John Bridges, male   DOB: 04/10/1956, 60 y.o.   MRN: [id number] TCTS DAILY ICU PROGRESS NOTE                   [address]            [place] [address]          [phone]   2 Days Post-Op Procedure(s) (LRB): VIDEO BRONCHOSCOPY (N/A) MINI/LIMITED THORACOTOMY WITH RIGHT UPPER LOBE WEDGE RESECTION (Right)  Total Length of Stay:  LOS: 2 days   Subjective: Stable, good resp effort  Objective: Vital signs in last 24 hours: Temp:  [97.4 F (36.3 C)-100.2 F (37.9 C)] 97.4 F (36.3 C) (05/10 0853) Pulse Rate:  [71-94] 82 (05/10 0800) Cardiac Rhythm:  [-] Normal sinus rhythm (05/10 0800) Resp:  [11-23] 15 (05/10 0800) BP: (127-162)/(50-84) 141/50 mmHg (05/10 0800) SpO2:  [93 %-100 %] 95 % (05/10 0800) Weight:  [191 lb 9.3 oz (86.9 kg)] 191 lb 9.3 oz (86.9 kg) (05/10 0600)  Filed Weights   09/25/15 1345 09/26/15 0523 09/27/15 0600  Weight: 189 lb 13.1 oz (86.1 kg) 190 lb 0.6 oz (86.2 kg) 191 lb 9.3 oz (86.9 kg)    Weight change: 1 lb 12.2 oz (0.8 kg)   Hemodynamic parameters for last 24 hours:    Intake/Output from previous day: 05/09 0701 - 05/10 0700 In: 1267.5 [P.O.:840; I.V.:327.5; IV Piggyback:100] Out: 1050 [Urine:725; Chest Tube:325]  Intake/Output this shift:    Current Meds: Scheduled Meds: . acetaminophen  1,000 mg Oral Q6H   Or  . acetaminophen (TYLENOL) oral liquid 160 mg/5 mL  1,000 mg Oral Q6H  . amLODipine  5 mg Oral Daily  . bisacodyl  10 mg Oral Daily  . enoxaparin (LOVENOX) injection  40 mg Subcutaneous Q24H  . fentaNYL   Intravenous Q4H  . insulin aspart  0-24 Units Subcutaneous Q6H  . ipratropium-albuterol  3 mL Nebulization BID  . pantoprazole  40 mg Oral Daily  . pravastatin  40 mg Oral q1800  . senna-docusate  1 tablet Oral QHS   Continuous Infusions: . bupivacaine ON-Q pain pump    . dextrose 5 % and 0.45 % NaCl with KCl 20 mEq/L 10 mL/hr at 09/27/15 0600   PRN Meds:.albuterol, diphenhydrAMINE **OR**  diphenhydrAMINE, naloxone **AND** sodium chloride flush, ondansetron (ZOFRAN) IV, ondansetron (ZOFRAN) IV, potassium chloride  General appearance: alert, cooperative and no distress Neurologic: intact Heart: regular rate and rhythm, S1, S2 normal, no murmur, click, rub or gallop Lungs: diminished breath sounds bibasilar Abdomen: soft, non-tender; bowel sounds normal; no masses,  no organomegaly and mild distention improved Extremities: extremities normal, atraumatic, no cyanosis or edema and Homans sign is negative, no sign of DVT Wound: no air leak  Lab Results: CBC: Recent Labs  09/26/15 0410 09/27/15 0415  WBC 8.8 10.5  HGB 10.8* 10.7*  HCT 32.8* 32.1*  PLT 208 204   BMET:  Recent Labs  09/26/15 0410 09/27/15 0415  NA 138 139  K 3.6 3.8  CL 102 103  CO2 26 27  GLUCOSE 133* 114*  BUN 8 6  CREATININE 0.81 0.69  CALCIUM 8.3* 8.6*    PT/INR: No results for input(s): LABPROT, INR in the last 72 hours. Radiology: Dg Chest Port 1 View  09/27/2015  CLINICAL DATA:  Chest tube in place EXAM: PORTABLE CHEST 1 VIEW COMPARISON:  Yesterday FINDINGS: Two right-sided chest tubes are in similar position. There is a right IJ central line  with tip at the SVC level. No visible pneumothorax. Right chest wall gas is re- demonstrated. Interstitial coarsening on the right which is likely postop atelectasis. Normal heart size and aortic contours. Mild atelectasis on the left with elevated diaphragm. IMPRESSION: Stable postoperative chest.  No convincing pneumothorax. Electronically Signed   By: Monte Fantasia M.D.   On: 09/27/2015 07:45     Assessment/Plan: S/P Procedure(s) (LRB): VIDEO BRONCHOSCOPY (N/A) MINI/LIMITED THORACOTOMY WITH RIGHT UPPER LOBE WEDGE RESECTION (Right) Mobilize d/c tubes/lines waiting for 3s bed Active bowel sounds,   Grace Isaac 09/27/2015 9:03 AM

## 2015-09-27 NOTE — Progress Notes (Signed)
Name: John Bridges MRN: [id number] DOB: [birth year]    ADMISSION DATE:  09/25/2015 CONSULTATION DATE:  09/26/2015  REFERRING MD :  CVTS  CHIEF COMPLAINT:  COPD exacerbation.  BRIEF PATIENT DESCRIPTION: 60 year old male with COPD history who presents with the CVTS service for a pulmonary nodule resection.  Patient had a recent COPD exacerbation that was treated with doxy and a prednisone taper.  Patient quit smoking 38 days ago and is starting to feel better from a respiratory standpoint.  Was not on home O2 and was only using PRN albuterol.  Moderate COPD with FEV1 of 64%.  Had a hypermetabolic nodule on PET and underwent a wedge resection.  Currently only complaining of soreness at the surgical side but no further respiratory complaints.  SIGNIFICANT EVENTS  5/8 wedge resection of hot pulmonary nodule and CT in place.  STUDIES:  PET with hypermetabolic pulmonary nodule.  HISTORY OF PRESENT ILLNESS:  60 year old male with COPD history who presents with the CVTS service for a pulmonary nodule resection.  Patient had a recent COPD exacerbation that was treated with doxy and a prednisone taper.  Patient quit smoking 38 days ago and is starting to feel better from a respiratory standpoint.  Was not on home O2 and was only using PRN albuterol.  Moderate COPD with FEV1 of 64%.  Had a hypermetabolic nodule on PET and underwent a wedge resection.  Currently only complaining of soreness at the surgical side but no further respiratory complaints.  SUBJECTIVE: CP improving.  VITAL SIGNS: Temp:  [97.4 F (36.3 C)-100.2 F (37.9 C)] 97.4 F (36.3 C) (05/10 0853) Pulse Rate:  [71-94] 82 (05/10 0800) Resp:  [11-23] 15 (05/10 0800) BP: (127-162)/(50-84) 141/50 mmHg (05/10 0800) SpO2:  [93 %-100 %] 95 % (05/10 0800) Weight:  [86.9 kg (191 lb 9.3 oz)] 86.9 kg (191 lb 9.3 oz) (05/10 0600)  PHYSICAL EXAMINATION: General:  Well appearing, mild painful distress. Neuro:  Alert and interactive, moving all  ext to command. HEENT:  [place]/AT, PERRL, EOM-I and MMM. Cardiovascular:  RRR, Nl S1/S2, -M/R/G. Lungs:  Distant but clear. Abdomen:  Soft, NT, ND and +BS. Musculoskeletal:  -edema and -tenderness. Skin:  Intact, surgical site clean.  Recent Labs Lab 09/21/15 1603 09/26/15 0410 09/27/15 0415  NA 138 138 139  K 3.8 3.6 3.8  CL 102 102 103  CO2 23 26 27   BUN 10 8 6   CREATININE 1.02 0.81 0.69  GLUCOSE 95 133* 114*   Recent Labs Lab 09/21/15 1603 09/26/15 0410 09/27/15 0415  HGB 13.6 10.8* 10.7*  HCT 40.7 32.8* 32.1*  WBC 11.4* 8.8 10.5  PLT 242 208 204   Dg Chest Port 1 View  09/27/2015  CLINICAL DATA:  Chest tube in place EXAM: PORTABLE CHEST 1 VIEW COMPARISON:  Yesterday FINDINGS: Two right-sided chest tubes are in similar position. There is a right IJ central line with tip at the SVC level. No visible pneumothorax. Right chest wall gas is re- demonstrated. Interstitial coarsening on the right which is likely postop atelectasis. Normal heart size and aortic contours. Mild atelectasis on the left with elevated diaphragm. IMPRESSION: Stable postoperative chest.  No convincing pneumothorax. Electronically Signed   By: Monte Fantasia M.D.   On: 09/27/2015 07:45   Dg Chest Port 1 View  09/26/2015  CLINICAL DATA:  Status post mini thoracotomy with right upper lobe wedge resection of a mass. EXAM: PORTABLE CHEST 1 VIEW COMPARISON:  Portable chest x-ray of Sep 25, 2015 FINDINGS: The lungs are adequately inflated. On the right no pneumothorax is evident. Decreased density in the right pulmonary apex. The 2 right chest tubes are in stable position. The interstitial markings of both lungs remain mildly increased. There is no pleural effusion. The heart is top-normal in size. The pulmonary vascularity is not engorged. The right internal jugular venous catheter tip projects over the proximal SVC. IMPRESSION: Stable mild interstitial prominence which may reflect low-grade interstitial edema or chronic  bronchitic changes. No CHF, pneumothorax, or significant pleural effusion. Decreased density in the right pulmonary apex likely reflecting resolving postsurgical edema or atelectasis. The support tubes are in stable position. Electronically Signed   By: David  Martinique M.D.   On: 09/26/2015 07:35   Dg Chest Port 1 View  09/25/2015  CLINICAL DATA:  Post bronchoscopy today EXAM: PORTABLE CHEST 1 VIEW COMPARISON:  09/25/2015 FINDINGS: Cardiomediastinal silhouette is stable. Two right chest tubes are noted. No pulmonary edema. There is no pneumothorax. There is streaky right upper lobe atelectasis or infiltrate. Right IJ central line with tip in upper SVC. No pulmonary edema. IMPRESSION: Right chest tubes. No pneumothorax. Streaky right upper lobe atelectasis or infiltrate. No pulmonary edema. Right IJ central line in place. Electronically Signed   By: Lahoma Crocker M.D.   On: 09/25/2015 11:44   I reviewed CXR myself, CT in place.  ASSESSMENT / PLAN:  60 year old male with moderate COPD presenting post op from a wedge resection from a pulmonary nodule excision.  Discussed with PCCM-NP.  Pulmonary nodule:  - Resection complete.  - No malignancy, granulomas and fibrosis noted.  - No steroids.  Chest pain:  - Narcotics as ordered to combat stenting.  - Minimize as able, causing constipation.  COPD:  - Albuterol PRN.  - O2 as needed.  - No need for steroids, not wheezing, will also help with wound healing.  Hypoxemia:  - Titrate O2 to off, do not think patient will need it.  - IS to combat atelectasis.  - Narcs for CP to avoid stenting.  Post op chest tube:  - CT to water seal.  - Remove one of the 2 tubes today.  PCCM will follow.  Rush Farmer, M.D. [facility] Pulmonary/Critical Care Medicine. Pager: [phone]. After hours pager: [phone].  09/27/2015, 9:37 AM

## 2015-09-27 NOTE — Discharge Summary (Signed)
Physician Discharge Summary       [address]       [place] [address]             [phone]    Patient ID: John Bridges MRN: [id number] DOB/AGE: [birth year] 60 y.o.  Admit date: 09/25/2015 Discharge date: 09/30/2015  Admission Diagnoses: Nodule of right upper lobe  Active Diagnoses:  1. Granulomatous inflammation of RUL 2. Tobacco abuse 3. Essential hypertension 4. Hyperlipidemia 5. COPD (chronic obstructive pulmonary disease) ([place]) 6.Depression 7.  History of bronchitis 8. Anemia  Procedure (s): Bronchoscopy, right video-assisted thoracoscopy, mini thoracotomy, wedge resection of right upper lobe, lymph node sampling, placement of On-Q device by Dr. Servando Snare on 09/25/2015.  Pathology: 1. Lung, wedge biopsy/resection, right upper lobe - INTERSTITIAL AND PLEURAL FIBROSIS WITH SCATTERED FIBROBLASTIC FOCI. - GRANULOMATOUS INFLAMMATION, FOCAL. - THERE IS NO EVIDENCE OF MALIGNANCY. - SEE COMMENT. 2. Lymph node, biopsy, 4R - THERE IS NO EVIDENCE OF CARCINOMA IN 1 OF 1 LYMPH NODE (0/1). 3. Lymph node, biopsy, 10R - THERE IS NO EVIDENCE OF CARCINOMA IN 1 OF 1 LYMPH NODE (0/1).  History of Presenting Illness: John Bridges 60 y.o. male is seen in the office today discovery new of a right upper lobe lung nodule 1.3 cm in size. The patient has been a long-term smoker more than 30 pack years. His wife is disabled with severe pulmonary disease and is followed by Dr. Chase Caller. The patient had increasing shortness of breath and some coughing and was encouraged by his wife to go to see her doctors. Evaluation including pulmonary function studies, CT scan of the chest low dose and ultimately a PET scan was performed followed pulmonary department and the patient referred because of a new slightly hypermetabolic 1.3 cm mass in the right upper lobe. The patient has stopped smoking 38 days ago.  The patient has had no previous cardiac history does have extensive  coronary calcifications on his CT of the chest and has a strong family history of in his brothers of both coronary artery disease and stroke. He has seen Dr Domenic Polite for preop clearance. He does have episodic symptoms of COPD flairs with cough and sob. Most recent several weeks ago.   Patient with long-term smoking history presents with the new discovery of a 1.3 cm slightly hypermetabolic suspicious lesion in the right upper lobe, possibly clinical stage I lung cancer versus inflammatory nodule.  He has a strong family history of coronary artery disease and stroke in a patient with severely calcified coronaries on chest CT. He has had recent Myoview stress test and cleared by Cardiology for surgery.  Dr. Servando Snare discussed with the patient the possible diagnosis of carcinoma the lung and recommended proceeding with bronchoscopy,right video-assisted thoracoscopy, wedge resection of right upper lobe lung lesion, possible lobectomy, and node dissection. The risks and options were discussed with the patient in detail and he is willing to proceed. He was admitted on 09/25/2015 in order to undergo the aforementioned surgery (right wedge resection and NO lobectomy).  Brief Hospital Course:  He remained afebrile and hemodynamically stable. His a line and foley were removed early in his post op course. He remained in the ICU for a couple of days and then was transferred to the stepdown unit for further convalescence. Chest tube output gradually decreased and there was no air leak. Daily chest x rays were obtained and remained stable. All chest tubs were removed by 05/12.  Final CXR showed resolution of apical  pneumothorax.  He is ambulating on room air. He is tolerating a diet and has had a bowel movement. His wounds are clean, dry, and continuing to heal.  He is felt medically stable for discharge home today.   Latest Vital Signs: Blood pressure 128/75, pulse 69, temperature 98 F (36.7 C), temperature  source Oral, resp. rate 12, height 5\' 8"  (1.727 m), weight 191 lb 9.3 oz (86.9 kg), SpO2 94 %.  Physical Exam: General appearance: alert, cooperative and no distress Neurologic: intact Heart: regular rate and rhythm, S1, S2 normal, no murmur, click, rub or gallop Lungs: diminished breath sounds bibasilar Abdomen: soft, non-tender; bowel sounds normal; no masses, no organomegaly and mild distention improved Extremities: extremities normal, atraumatic, no cyanosis or edema and Homans sign is negative, no sign of DVT Wound: no air leak  Discharge Condition: Stable and discharged to home.  Recent laboratory studies:  Lab Results  Component Value Date   WBC 9.5 09/28/2015   HGB 10.9* 09/28/2015   HCT 32.2* 09/28/2015   MCV 97.6 09/28/2015   PLT 202 09/28/2015   Lab Results  Component Value Date   NA 139 09/28/2015   K 4.0 09/28/2015   CL 102 09/28/2015   CO2 29 09/28/2015   CREATININE 0.75 09/28/2015   GLUCOSE 103* 09/28/2015    Diagnostic Studies: Dg Chest 2 View  09/25/2015  CLINICAL DATA:  Cough and congestion. EXAM: CHEST  2 VIEW COMPARISON:  CT 08/02/2015 FINDINGS: The right upper lobe nodule is not conclusively visible, probably superimposed on the first rib. The lungs are clear. The pulmonary vasculature is normal. Hilar and mediastinal contours are unremarkable and unchanged. No large effusions. IMPRESSION: No acute cardiopulmonary findings. Electronically Signed   By: Andreas Newport M.D.   On: 09/25/2015 06:16   Nm Myocar Multi W/spect W/wall Motion / Ef  08/31/2015   There was no ST segment deviation noted during stress.  The left ventricular ejection fraction is normal (55-65%).  Findings consistent with prior moderate inferior myocardial infarction with mild peri-infarct ischemia.  Overall low to intermediate risk study. Moderate size prior infarct with fairly mild amount of myocardium currently at jeopardy.    Dg Chest Port 1 View  09/29/2015  CLINICAL DATA:   Pneumothorax EXAM: PORTABLE CHEST 1 VIEW COMPARISON:  Yesterday FINDINGS: Stable small, less than 5%, right apical pneumothorax with chest tube in unchanged position. Stable right IJ central line. Patchy atelectasis. COPD with chronic hyperinflation. Normal heart size. IMPRESSION: Unchanged right pneumothorax less than 5%. Electronically Signed   By: Monte Fantasia M.D.   On: 09/29/2015 08:13   Dg Chest Port 1 View  09/28/2015  CLINICAL DATA:  Chest tube, followup EXAM: PORTABLE CHEST 1 VIEW COMPARISON:  Portable exam 0706 hours compared to 09/27/2015 FINDINGS: RIGHT jugular line tip projects over SVC. RIGHT thoracostomy tube unchanged with interval removal of second RIGHT thoracostomy tube. Normal heart size and mediastinal contours. Patchy RIGHT lung infiltrates slightly increased at bases. LEFT lung clear. Small RIGHT apex pneumothorax. No pleural effusion seen. RIGHT glenohumeral degenerative changes and probable chronic rotator cuff tear. IMPRESSION: Small RIGHT apex pneumothorax following removal of 1 of 2 RIGHT thoracostomy tubes. Slightly increased RIGHT lung infiltrate. Electronically Signed   By: Lavonia Dana M.D.   On: 09/28/2015 08:01   Dg Chest Port 1 View  09/27/2015  CLINICAL DATA:  Chest tube in place EXAM: PORTABLE CHEST 1 VIEW COMPARISON:  Yesterday FINDINGS: Two right-sided chest tubes are in similar position. There is a right  IJ central line with tip at the SVC level. No visible pneumothorax. Right chest wall gas is re- demonstrated. Interstitial coarsening on the right which is likely postop atelectasis. Normal heart size and aortic contours. Mild atelectasis on the left with elevated diaphragm. IMPRESSION: Stable postoperative chest.  No convincing pneumothorax. Electronically Signed   By: Monte Fantasia M.D.   On: 09/27/2015 07:45   Dg Chest Port 1 View  09/26/2015  CLINICAL DATA:  Status post mini thoracotomy with right upper lobe wedge resection of a mass. EXAM: PORTABLE CHEST 1  VIEW COMPARISON:  Portable chest x-ray of Sep 25, 2015 FINDINGS: The lungs are adequately inflated. On the right no pneumothorax is evident. Decreased density in the right pulmonary apex. The 2 right chest tubes are in stable position. The interstitial markings of both lungs remain mildly increased. There is no pleural effusion. The heart is top-normal in size. The pulmonary vascularity is not engorged. The right internal jugular venous catheter tip projects over the proximal SVC. IMPRESSION: Stable mild interstitial prominence which may reflect low-grade interstitial edema or chronic bronchitic changes. No CHF, pneumothorax, or significant pleural effusion. Decreased density in the right pulmonary apex likely reflecting resolving postsurgical edema or atelectasis. The support tubes are in stable position. Electronically Signed   By: David  Martinique M.D.   On: 09/26/2015 07:35   Dg Chest Port 1 View  09/25/2015  CLINICAL DATA:  Post bronchoscopy today EXAM: PORTABLE CHEST 1 VIEW COMPARISON:  09/25/2015 FINDINGS: Cardiomediastinal silhouette is stable. Two right chest tubes are noted. No pulmonary edema. There is no pneumothorax. There is streaky right upper lobe atelectasis or infiltrate. Right IJ central line with tip in upper SVC. No pulmonary edema. IMPRESSION: Right chest tubes. No pneumothorax. Streaky right upper lobe atelectasis or infiltrate. No pulmonary edema. Right IJ central line in place. Electronically Signed   By: Lahoma Crocker M.D.   On: 09/25/2015 11:44   Discharge Medications:   Medication List    TAKE these medications        acetaminophen 500 MG tablet  Commonly known as:  TYLENOL  Take 2 tablets (1,000 mg total) by mouth every 6 (six) hours as needed for mild pain or fever.     amLODipine 10 MG tablet  Commonly known as:  NORVASC  Take 10 mg by mouth daily.     HYDROcodone-acetaminophen 7.5-325 MG tablet  Commonly known as:  NORCO  Take 1 tablet by mouth every 4 (four) hours as  needed for moderate pain (Must last 30 days.  Do not drive or operate machinery while taking this medicine.).     HYDROcodone-acetaminophen 7.5-325 MG tablet  Commonly known as:  NORCO  Take 1 tablet by mouth every 4 to 6 hours as needed for pain     lisinopril-hydrochlorothiazide 20-12.5 MG tablet  Commonly known as:  PRINZIDE,ZESTORETIC  Take 1 tablet by mouth daily.     pantoprazole 40 MG tablet  Commonly known as:  PROTONIX  Take 1 tablet (40 mg total) by mouth daily.     pravastatin 40 MG tablet  Commonly known as:  PRAVACHOL  Take 40 mg by mouth daily.     tiZANidine 4 MG tablet  Commonly known as:  ZANAFLEX  Take 4 mg by mouth every 6 (six) hours as needed for muscle spasms.        Follow Up Appointments: Follow-up Information    Follow up with Grace Isaac, MD On 10/26/2015.   Specialty:  Cardiothoracic Surgery  Why:  PA/LAT CXR to be taken (at [facility] which is in the same building as Dr. Everrett Coombe office) on 10/26/2015 at 12:15 pm ;Appointment time is at1:00 pm   Contact information:   [address] [place] [address] [phone]       Follow up with Nurse.   Why:  Appointment is with nurse only for chest tube suture removal only. Appointment time is at 10:00 am   Contact information:   [address] [place] [address] [phone]      Signed: Cinda Quest 09/30/2015, 9:24 AM

## 2015-09-27 NOTE — Discharge Instructions (Signed)
Thoracotomy, Care After ° These instructions give you information on caring for yourself after your procedure. Your doctor may also give you more specific instructions. Call your doctor if you have any problems or questions after your procedure. °HOME CARE °· Only take medicine as told by your doctor. Take pain medicine before your pain gets very bad. °· Take deep breaths to protect yourself from a lung infection (pneumonia). °· Cough often to clear thick spit (mucus) and to open your lungs. Hold a pillow against your chest if it hurts to cough. °· Use your breathing machine (incentive spirometer) as told. °· Change bandages (dressings) as told by your doctor. °· Take off the bandage over your chest tube area as told by your doctor. °· Continue your normal diet as told by your doctor. °· Eat high-fiber foods. This includes whole grain cereals, brown rice, beans, and fresh fruits and vegetables. °· Drink enough fluids to keep your pee (urine) clear or pale yellow. Avoid caffeine. °· Talk to your doctor about taking a medicine to soften your poop (stool softener, laxative). °· Keep doing activities as told by your doctor. Balance your activity with rest. °· Avoid lifting until your doctor says it is okay. °· Do not drive until told by your doctor. Do not drive while taking pain medicines (narcotics). °· Do not bathe, swim, or use a hot tub until told by your doctor. You may shower. Gently wash the area of your cut (incision) with soap and water as told. °· Do not use any tobacco products including cigarettes, chewing tobacco, and electronic cigarettes. Avoid being around others who smoke. °· Schedule a doctor visit to get your stitches or staples removed as told. °· Schedule and go to all doctor visits as told. °· Go to therapy that can help improve your lungs (pulmonary rehabilitation) as told by your doctor. °· Do not travel by airplane for 2 weeks after the chest tube is taken out. °GET HELP IF: °· You are bleeding  from your wounds. °· You have redness, puffiness (swelling), or more pain in the wounds. °· You feel your heart beating fast or skipping beats (irregular heartbeat). °· You have yellowish-white fluid (pus) coming from your wounds. °· You have a bad smell coming from the wound or bandage. °· You have a fever or chills. °· You feel sick to your stomach or you throw up (vomit). °· You have muscle aches. °GET HELP RIGHT AWAY IF: °· You have a rash. °· You have trouble breathing. °· Your medicines are causing you problems. °· You keep feeling sick to your stomach (nauseous). °· You feel light-headed. °· You have shortness of breath or chest pain. °· You have pain that will not go away. °MAKE SURE YOU: °· Understand these instructions. °· Will watch your condition. °· Will get help right away if you are not doing well or get worse. °  °This information is not intended to replace advice given to you by your health care provider. Make sure you discuss any questions you have with your health care provider. °  °Document Released: 11/05/2011 Document Revised: 05/27/2014 Document Reviewed: 12/23/2012 °Elsevier Interactive Patient Education ©2016 [employer] ° °

## 2015-09-28 ENCOUNTER — Inpatient Hospital Stay (HOSPITAL_COMMUNITY): Payer: Medicare HMO

## 2015-09-28 LAB — CBC
HCT: 32.2 % — ABNORMAL LOW (ref 39.0–52.0)
Hemoglobin: 10.9 g/dL — ABNORMAL LOW (ref 13.0–17.0)
MCH: 33 pg (ref 26.0–34.0)
MCHC: 33.9 g/dL (ref 30.0–36.0)
MCV: 97.6 fL (ref 78.0–100.0)
Platelets: 202 10*3/uL (ref 150–400)
RBC: 3.3 MIL/uL — ABNORMAL LOW (ref 4.22–5.81)
RDW: 12.3 % (ref 11.5–15.5)
WBC: 9.5 10*3/uL (ref 4.0–10.5)

## 2015-09-28 LAB — CULTURE, RESPIRATORY W GRAM STAIN: Culture: NORMAL

## 2015-09-28 LAB — BASIC METABOLIC PANEL
Anion gap: 8 (ref 5–15)
BUN: 11 mg/dL (ref 6–20)
CO2: 29 mmol/L (ref 22–32)
Calcium: 9 mg/dL (ref 8.9–10.3)
Chloride: 102 mmol/L (ref 101–111)
Creatinine, Ser: 0.75 mg/dL (ref 0.61–1.24)
GFR calc Af Amer: >60 mL/min (ref >60)
GFR calc non Af Amer: >60 mL/min (ref >60)
Glucose, Bld: 103 mg/dL — ABNORMAL HIGH (ref 65–99)
Potassium: 4 mmol/L (ref 3.5–5.1)
Sodium: 139 mmol/L (ref 135–145)

## 2015-09-28 LAB — GLUCOSE, CAPILLARY
Glucose-Capillary: 124 mg/dL — ABNORMAL HIGH (ref 65–99)
Glucose-Capillary: 125 mg/dL — ABNORMAL HIGH (ref 65–99)
Glucose-Capillary: 133 mg/dL — ABNORMAL HIGH (ref 65–99)
Glucose-Capillary: 144 mg/dL — ABNORMAL HIGH (ref 65–99)
Glucose-Capillary: 171 mg/dL — ABNORMAL HIGH (ref 65–99)

## 2015-09-28 MED ORDER — AMLODIPINE BESYLATE 10 MG PO TABS
10.0000 mg | ORAL_TABLET | Freq: Every day | ORAL | Status: DC
  Administered 2015-09-28 – 2015-09-30 (×3): 10 mg via ORAL
  Filled 2015-09-28 (×3): qty 1

## 2015-09-28 MED ORDER — INSULIN ASPART 100 UNIT/ML ~~LOC~~ SOLN
0.0000 [IU] | Freq: Three times a day (TID) | SUBCUTANEOUS | Status: DC
  Administered 2015-09-28: 4 [IU] via SUBCUTANEOUS
  Administered 2015-09-28: 2 [IU] via SUBCUTANEOUS

## 2015-09-28 MED ORDER — TRAMADOL HCL 50 MG PO TABS
50.0000 mg | ORAL_TABLET | Freq: Four times a day (QID) | ORAL | Status: DC | PRN
  Administered 2015-09-28 – 2015-09-30 (×4): 50 mg via ORAL
  Filled 2015-09-28 (×4): qty 1

## 2015-09-28 NOTE — Progress Notes (Addendum)
      [address]       [place] [address]             [phone]      3 Days Post-Op Procedure(s) (LRB): VIDEO BRONCHOSCOPY (N/A) MINI/LIMITED THORACOTOMY WITH RIGHT UPPER LOBE WEDGE RESECTION (Right)   Subjective:  Mr. John Bridges states he is doing okay.  His pain is well controlled.  Patient's wife is disabled, request H/H   Ambulating   +BM   Objective: Vital signs in last 24 hours: Temp:  [97.4 F (36.3 C)-98.7 F (37.1 C)] 97.6 F (36.4 C) (05/11 0400) Pulse Rate:  [73-88] 75 (05/11 0415) Cardiac Rhythm:  [-] Normal sinus rhythm (05/11 0415) Resp:  [14-22] 16 (05/11 0804) BP: (126-146)/(62-91) 146/70 mmHg (05/11 0415) SpO2:  [93 %-98 %] 95 % (05/11 0804)  Intake/Output from previous day: 05/10 0701 - 05/11 0700 In: 600 [P.O.:360; I.V.:240] Out: 520 [Urine:400; Chest Tube:120]  General appearance: alert, cooperative and no distress Heart: regular rate and rhythm Lungs: clear to auscultation bilaterally Abdomen: soft, non-tender; bowel sounds normal; no masses,  no organomegaly Wound: clean, some serous drainage around chest tube  Lab Results:  Recent Labs  09/27/15 0415 09/28/15 0450  WBC 10.5 9.5  HGB 10.7* 10.9*  HCT 32.1* 32.2*  PLT 204 202   BMET:  Recent Labs  09/27/15 0415 09/28/15 0450  NA 139 139  K 3.8 4.0  CL 103 102  CO2 27 29  GLUCOSE 114* 103*  BUN 6 11  CREATININE 0.69 0.75  CALCIUM 8.6* 9.0    PT/INR: No results for input(s): LABPROT, INR in the last 72 hours. ABG    Component Value Date/Time   PHART 7.386 09/26/2015 0515   HCO3 26.8* 09/26/2015 0515   TCO2 28.2 09/26/2015 0515   O2SAT 94.2 09/26/2015 0515   CBG (last 3)   Recent Labs  09/27/15 1821 09/28/15 0013 09/28/15 0411  GLUCAP 109* 133* 125*    Assessment/Plan: S/P Procedure(s) (LRB): VIDEO BRONCHOSCOPY (N/A) MINI/LIMITED THORACOTOMY WITH RIGHT UPPER LOBE WEDGE RESECTION (Right)  1. Chest tube- no air leak appreciated, 120 cc output  yesterday- no air leak present, will leave chest tube today 2. Pulm- wean oxygen as tolerated, CXR shows new small right pneumothorax, good use of IS 3. D/C Central Line 4. CV- HTN- will titrate Norvasc to home dose 5. Dispo- patient stable, d/c central line, leave chest tube today   LOS: 3 days    John Bridges, John Bridges 09/28/2015  I have seen and examined John Bridges and agree with the above assessment  and plan.  John Isaac MD [facility] [phone] Office [phone] 09/28/2015 3:15 PM

## 2015-09-28 NOTE — Progress Notes (Signed)
Utilization review completed.  

## 2015-09-28 NOTE — Care Management Important Message (Signed)
Important Message  Patient Details  Name: John Bridges MRN: [id number] Date of Birth: 1956/03/05   Medicare Important Message Given:  Yes    Nathen May 09/28/2015, 1:13 PM

## 2015-09-28 NOTE — Care Management Note (Signed)
Case Management Note Marvetta Gibbons RN, BSN Unit 2W-Case Manager [phone]  Patient Details  Name: John Bridges MRN: [id number] Date of Birth: [birth year]  Subjective/Objective:    Pt admitted s/p VATS              Action/Plan: PTA pt lived at home with wife who is disabled - referral received for [facility]- spoke with pt at bedside- per conversation pt states that he does not feel like he will need a HHRN at discharge- unless he has to go home with CT- plan at this point is to d/c CT in the next 1-2 days. Per pt he does not have home 02 for himself at home (states that his wife has it). CM will continue to follow- at this time pt politely refused HHRN.    Expected Discharge Date:                  Expected Discharge Plan:  Home/Self Care  In-House Referral:     Discharge planning Services  CM Consult  Post Acute Care Choice:  Home Health Choice offered to:  Patient  DME Arranged:    DME Agency:     HH Arranged:  RN, Patient Refused [place] Agency:     Status of Service:  In process, will continue to follow  Medicare Important Message Given:  Yes Date Medicare IM Given:    Medicare IM give by:    Date Additional Medicare IM Given:    Additional Medicare Important Message give by:     If discussed at [place] of Stay Meetings, dates discussed:    Additional Comments:  Dawayne Patricia, RN 09/28/2015, 3:58 PM

## 2015-09-28 NOTE — Progress Notes (Signed)
Name: John Bridges MRN: [id number] DOB: 06-07-1955    ADMISSION DATE:  09/25/2015 CONSULTATION DATE:  09/26/2015  REFERRING MD :  CVTS  CHIEF COMPLAINT:  COPD exacerbation.  BRIEF PATIENT DESCRIPTION: 60 year old male with COPD history who presents with the CVTS service for a pulmonary nodule resection.  Patient had a recent COPD exacerbation that was treated with doxy and a prednisone taper.  Patient quit smoking 38 days ago and is starting to feel better from a respiratory standpoint.  Was not on home O2 and was only using PRN albuterol.  Moderate COPD with FEV1 of 64%.  Had a hypermetabolic nodule on PET and underwent a wedge resection.  Currently only complaining of soreness at the surgical side but no further respiratory complaints.  SIGNIFICANT EVENTS  5/8 wedge resection of hot pulmonary nodule and CT in place  STUDIES:  PET with hypermetabolic pulmonary nodule   SUBJECTIVE:  Pt reports feeling better, relieved that surgery findings are malignant.    VITAL SIGNS: Temp:  [97.6 F (36.4 C)-98.7 F (37.1 C)] 97.6 F (36.4 C) (05/11 0400) Pulse Rate:  [73-88] 75 (05/11 0415) Resp:  [14-22] 16 (05/11 0804) BP: (126-146)/(62-91) 146/70 mmHg (05/11 0415) SpO2:  [93 %-98 %] 95 % (05/11 0804)  PHYSICAL EXAMINATION: General:  Well appearing, in NAD Neuro:  Alert and interactive, moving all ext to command. HEENT:  [place]/AT, PERRL, EOM-I and MMM. Cardiovascular:  RRR, Nl S1/S2, -M/R/G. Lungs:  Even/non-labored, diminished RLL, clear left Abdomen:  Soft, NT, ND and +BS. Musculoskeletal:  -edema and -tenderness. Skin:  Intact, surgical site clean.  Recent Labs Lab 09/26/15 0410 09/27/15 0415 09/28/15 0450  NA 138 139 139  K 3.6 3.8 4.0  CL 102 103 102  CO2 26 27 29   BUN 8 6 11   CREATININE 0.81 0.69 0.75  GLUCOSE 133* 114* 103*    Recent Labs Lab 09/26/15 0410 09/27/15 0415 09/28/15 0450  HGB 10.8* 10.7* 10.9*  HCT 32.8* 32.1* 32.2*  WBC 8.8 10.5 9.5  PLT 208 204  202   Dg Chest Port 1 View  09/28/2015  CLINICAL DATA:  Chest tube, followup EXAM: PORTABLE CHEST 1 VIEW COMPARISON:  Portable exam 0706 hours compared to 09/27/2015 FINDINGS: RIGHT jugular line tip projects over SVC. RIGHT thoracostomy tube unchanged with interval removal of second RIGHT thoracostomy tube. Normal heart size and mediastinal contours. Patchy RIGHT lung infiltrates slightly increased at bases. LEFT lung clear. Small RIGHT apex pneumothorax. No pleural effusion seen. RIGHT glenohumeral degenerative changes and probable chronic rotator cuff tear. IMPRESSION: Small RIGHT apex pneumothorax following removal of 1 of 2 RIGHT thoracostomy tubes. Slightly increased RIGHT lung infiltrate. Electronically Signed   By: Lavonia Dana M.D.   On: 09/28/2015 08:01   Dg Chest Port 1 View  09/27/2015  CLINICAL DATA:  Chest tube in place EXAM: PORTABLE CHEST 1 VIEW COMPARISON:  Yesterday FINDINGS: Two right-sided chest tubes are in similar position. There is a right IJ central line with tip at the SVC level. No visible pneumothorax. Right chest wall gas is re- demonstrated. Interstitial coarsening on the right which is likely postop atelectasis. Normal heart size and aortic contours. Mild atelectasis on the left with elevated diaphragm. IMPRESSION: Stable postoperative chest.  No convincing pneumothorax. Electronically Signed   By: Monte Fantasia M.D.   On: 09/27/2015 07:45    ASSESSMENT / PLAN:  60 year old male with moderate COPD presenting post op from a wedge resection from a pulmonary nodule excision.  Right Pulmonary nodule:  - Resection complete.  - No malignancy, granulomas and fibrosis noted.  - No steroids.  - Follow up with TCTS post discharge  Chest pain:  - Narcotics as ordered to combat stenting.  - Minimize as able, causing constipation.  COPD:  - Albuterol PRN.  - O2 as needed, weaned off 5/10  - No need for steroids, not wheezing, will also help with wound healing.  - Smoking  cessation encouraged (continued)   Hypoxemia - resolved  - Monitor O2 saturations  - IS to combat atelectasis.  - Narcs for CP to avoid stenting.  Post op chest tube:  - CT to water seal.  - Removal per TCTS   PCCM will be available PRN.  Please call if new needs arise.   Noe Gens, NP-C [facility] Pgr: [phone] or if no answer [phone] 09/28/2015, 9:32 AM

## 2015-09-29 ENCOUNTER — Inpatient Hospital Stay (HOSPITAL_COMMUNITY): Payer: Medicare HMO

## 2015-09-29 LAB — GLUCOSE, CAPILLARY: Glucose-Capillary: 91 mg/dL (ref 65–99)

## 2015-09-29 MED ORDER — HYDROCODONE-ACETAMINOPHEN 7.5-325 MG PO TABS
1.0000 | ORAL_TABLET | ORAL | Status: DC | PRN
  Administered 2015-09-29 – 2015-09-30 (×3): 1 via ORAL
  Filled 2015-09-29 (×3): qty 1

## 2015-09-29 MED ORDER — HYDROCODONE-ACETAMINOPHEN 7.5-325 MG PO TABS
1.0000 | ORAL_TABLET | ORAL | Status: DC | PRN
  Administered 2015-09-29 (×2): 1 via ORAL
  Filled 2015-09-29 (×2): qty 1

## 2015-09-29 NOTE — Progress Notes (Signed)
Chest tube removed as per order. Central line removed. No adverse events noted. Patient tolerated procedure well.

## 2015-09-29 NOTE — Progress Notes (Addendum)
      [address]       [place] [address]             [phone]      4 Days Post-Op Procedure(s) (LRB): VIDEO BRONCHOSCOPY (N/A) MINI/LIMITED THORACOTOMY WITH RIGHT UPPER LOBE WEDGE RESECTION (Right)   Subjective:  Patient with pain at chest tube site.  States he really noticed it after pain catheter was removed.  + ambulation   + BM  Objective: Vital signs in last 24 hours: Temp:  [98 F (36.7 C)-98.3 F (36.8 C)] 98 F (36.7 C) (05/12 0754) Pulse Rate:  [56-80] 72 (05/12 0754) Cardiac Rhythm:  [-] Normal sinus rhythm (05/12 0754) Resp:  [12-18] 17 (05/12 0755) BP: (124-141)/(61-80) 133/69 mmHg (05/12 0754) SpO2:  [92 %-98 %] 98 % (05/12 0755)  Intake/Output from previous day: 05/11 0701 - 05/12 0700 In: 960 [P.O.:720; I.V.:240] Out: 750 [Urine:550; Chest Tube:200] Intake/Output this shift: Total I/O In: 260 [P.O.:240; I.V.:20] Out: 10 [Chest Tube:10]  General appearance: alert, cooperative and no distress Heart: regular rate and rhythm Lungs: clear to auscultation bilaterally Abdomen: soft, non-tender; bowel sounds normal; no masses,  no organomegaly Wound: clean and dry  Lab Results:  Recent Labs  09/27/15 0415 09/28/15 0450  WBC 10.5 9.5  HGB 10.7* 10.9*  HCT 32.1* 32.2*  PLT 204 202   BMET:  Recent Labs  09/27/15 0415 09/28/15 0450  NA 139 139  K 3.8 4.0  CL 103 102  CO2 27 29  GLUCOSE 114* 103*  BUN 6 11  CREATININE 0.69 0.75  CALCIUM 8.6* 9.0    PT/INR: No results for input(s): LABPROT, INR in the last 72 hours. ABG    Component Value Date/Time   PHART 7.386 09/26/2015 0515   HCO3 26.8* 09/26/2015 0515   TCO2 28.2 09/26/2015 0515   O2SAT 94.2 09/26/2015 0515   CBG (last 3)   Recent Labs  09/28/15 1201 09/28/15 1647 09/28/15 2113  GLUCAP 124* 171* 144*    Assessment/Plan: S/P Procedure(s) (LRB): VIDEO BRONCHOSCOPY (N/A) MINI/LIMITED THORACOTOMY WITH RIGHT UPPER LOBE WEDGE RESECTION (Right)  1. Chest  tube- no air leak, 200 cc output- CXR with stable tiny apical pneumothorax, no significant pleural effusion- will d/c chest tube today 2. D/C PCA 3. D/C CBGs, SSIP as patient not a diabetic 4. CV- HTN, improved after increase Norvasc 5. Dispo- patient stable, d/c chest tube, repeat CXR in AM if remains stable will d/c home tomorrow    LOS: 4 days    [name], [name] 09/29/2015  I have seen and examined John Bridges and agree with the above assessment  and plan.  Grace Isaac MD Beeper [phone] Office [phone] 09/29/2015 2:39 PM

## 2015-09-30 ENCOUNTER — Inpatient Hospital Stay (HOSPITAL_COMMUNITY): Payer: Medicare HMO

## 2015-09-30 MED ORDER — HYDROCODONE-ACETAMINOPHEN 7.5-325 MG PO TABS: ORAL_TABLET | ORAL | Status: DC

## 2015-09-30 MED ORDER — SIMETHICONE 80 MG PO CHEW
80.0000 mg | CHEWABLE_TABLET | Freq: Four times a day (QID) | ORAL | Status: DC | PRN
  Administered 2015-09-30: 80 mg via ORAL
  Filled 2015-09-30: qty 1

## 2015-09-30 MED ORDER — ACETAMINOPHEN 500 MG PO TABS: 1000.0000 mg | ORAL_TABLET | Freq: Four times a day (QID) | ORAL | Status: DC | PRN

## 2015-09-30 NOTE — Progress Notes (Addendum)
      [address]       [place] [address]             [phone]      5 Days Post-Op Procedure(s) (LRB): VIDEO BRONCHOSCOPY (N/A) MINI/LIMITED THORACOTOMY WITH RIGHT UPPER LOBE WEDGE RESECTION (Right)   Subjective:  John Bridges complains of a "popping" sensation in is upper abdomen.  He states he coughed pretty hard and felt a "pop" now he feels like there is bubbles under the skin.  Objective: Vital signs in last 24 hours: Temp:  [97.3 F (36.3 C)-98.6 F (37 C)] 98 F (36.7 C) (05/13 0732) Pulse Rate:  [69-84] 69 (05/13 0419) Cardiac Rhythm:  [-] Normal sinus rhythm (05/13 0732) Resp:  [12-17] 12 (05/13 0419) BP: (107-140)/(58-85) 128/75 mmHg (05/13 0419) SpO2:  [92 %-97 %] 94 % (05/13 0419)  Intake/Output from previous day: 05/12 0701 - 05/13 0700 In: 1483.3 [P.O.:1440; I.V.:43.3] Out: 160 [Urine:150; Chest Tube:10]  General appearance: alert, cooperative and no distress Heart: regular rate and rhythm Lungs: clear to auscultation bilaterally Abdomen: soft, non tender, hyperactive BS Wound: clean and dry  Lab Results:  Recent Labs  09/28/15 0450  WBC 9.5  HGB 10.9*  HCT 32.2*  PLT 202   BMET:  Recent Labs  09/28/15 0450  NA 139  K 4.0  CL 102  CO2 29  GLUCOSE 103*  BUN 11  CREATININE 0.75  CALCIUM 9.0    PT/INR: No results for input(s): LABPROT, INR in the last 72 hours. ABG    Component Value Date/Time   PHART 7.386 09/26/2015 0515   HCO3 26.8* 09/26/2015 0515   TCO2 28.2 09/26/2015 0515   O2SAT 94.2 09/26/2015 0515   CBG (last 3)   Recent Labs  09/28/15 1647 09/28/15 2113 09/29/15 0832  GLUCAP 171* 144* 91    Assessment/Plan: S/P Procedure(s) (LRB): VIDEO BRONCHOSCOPY (N/A) MINI/LIMITED THORACOTOMY WITH RIGHT UPPER LOBE WEDGE RESECTION (Right)  1. Pulm- chest tube removed yesterday- CXR with resolution of pneumothorax, some atelectasis, continued use of IS at discharge 2. CV- HTN, continue home Norvasc 3.  RUQ pain- benign abdominal exam, no nausea, vomiting, has moved bowels..popping sensation could be related to rib fracture, mylicon for gas 4. Dispo- patient stable, will plan to d/c home today   LOS: 5 days    John Bridges 09/30/2015  Chest xray ok, plan d/c today  I have seen and examined John Bridges and agree with the above assessment  and plan.  Grace Isaac MD Beeper [phone] Office [phone] 09/30/2015 10:10 AM

## 2015-09-30 NOTE — Progress Notes (Signed)
Reviewed discharge instructions including medications, appointments and wound care with pt. Pt will be discharged in wheelchair when family arrives. Consuelo Pandy RN

## 2015-10-03 ENCOUNTER — Telehealth: Payer: Self-pay | Admitting: Orthopaedic Surgery

## 2015-10-03 MED ORDER — HYDROCODONE-ACETAMINOPHEN 7.5-325 MG PO TABS: 1.0000 | ORAL_TABLET | ORAL | Status: DC | PRN

## 2015-10-03 NOTE — Telephone Encounter (Signed)
Patient called and requested refill on Hydrocodone-Acetaminophen(Norco) 7.5-325 mgs. Qty 120         Sig: Take 1 tablet by mouth every 4 (four) hours as needed for moderate pain (Must last 30 days. Do not drive or operate machinery while taking this medicine.).

## 2015-10-03 NOTE — Telephone Encounter (Signed)
Rx done. 

## 2015-10-09 ENCOUNTER — Telehealth: Payer: Self-pay | Admitting: Orthopaedic Surgery

## 2015-10-09 NOTE — Telephone Encounter (Signed)
Rx done. 

## 2015-10-10 ENCOUNTER — Encounter (INDEPENDENT_AMBULATORY_CARE_PROVIDER_SITE_OTHER): Payer: Self-pay

## 2015-10-10 DIAGNOSIS — Z9889 Other specified postprocedural states: Secondary | ICD-10-CM

## 2015-10-11 ENCOUNTER — Encounter: Payer: Self-pay | Admitting: *Deleted

## 2015-10-17 ENCOUNTER — Ambulatory Visit (INDEPENDENT_AMBULATORY_CARE_PROVIDER_SITE_OTHER): Payer: Medicare HMO | Admitting: Orthopaedic Surgery

## 2015-10-17 ENCOUNTER — Encounter: Payer: Self-pay | Admitting: Orthopaedic Surgery

## 2015-10-17 VITALS — BP 130/76 | HR 90 | Temp 98.8°F | Ht 68.0 in | Wt 194.0 lb

## 2015-10-17 DIAGNOSIS — M25511 Pain in right shoulder: Secondary | ICD-10-CM | POA: Diagnosis not present

## 2015-10-17 DIAGNOSIS — J449 Chronic obstructive pulmonary disease, unspecified: Secondary | ICD-10-CM

## 2015-10-17 NOTE — Patient Instructions (Signed)
Continue medication.

## 2015-10-17 NOTE — Progress Notes (Signed)
Patient John Bridges, male DOB:1955/07/14, 60 y.o. [id number]  Chief Complaint  Patient presents with  . Follow-up    Right shoulder pain    HPI  John Bridges is a 60 y.o. male who has chronic right shoulder pain.  He has been doing his exercises.  He has no paresthesias. He has no new trauma  He did have recent surgery of the right lung.  They removed part of it.  They thought he might have a cancer as he had a positive PET scan.  But it was not cancerous.  He is recovering from that.  He has stopped smoking completely.   HPI  Body mass index is 29.5 kg/(m^2).  ROS  Review of Systems  Constitutional:       Patient does not have Diabetes Mellitus. Patient has hypertension. Patient has COPD or shortness of breath. Patient does not have BMI > 35. Patient has current smoking history.  Musculoskeletal: Positive for myalgias and arthralgias.  he has stopped smoking.  Past Medical History  Diagnosis Date  . Essential hypertension   . Hyperlipidemia   . Lung nodule     Right upper lobe lung nodule 1.3 cm   . COPD (chronic obstructive pulmonary disease) ([place])   . Shortness of breath dyspnea   . History of bronchitis   . Depression     Past Surgical History  Procedure Laterality Date  . Leg surgery Left   . Tooth extraction    . Colonoscopy    . Video bronchoscopy N/A 09/25/2015    Procedure: VIDEO BRONCHOSCOPY;  Surgeon: John Isaac, MD;  Location: [facility];  Service: Thoracic;  Laterality: N/A;  . Thoracotomy Right 09/25/2015    Procedure: MINI/LIMITED THORACOTOMY WITH RIGHT UPPER LOBE WEDGE RESECTION;  Surgeon: John Isaac, MD;  Location: [facility];  Service: Thoracic;  Laterality: Right;    Family History  Problem Relation Age of Onset  . Hypertension Mother   . Hypertension Father   . Cancer Brother   . CAD Brother   . Stroke Brother     Social History Social History  Substance Use Topics  . Smoking status: Former Smoker -- 1.00 packs/day for 40  years    Types: Cigarettes    Quit date: 08/16/2015  . Smokeless tobacco: None  . Alcohol Use: 0.0 oz/week    0 Standard drinks or equivalent per week     Comment: 3-4 times a week    No Known Allergies  Current Outpatient Prescriptions  Medication Sig Dispense Refill  . acetaminophen (TYLENOL) 500 MG tablet Take 2 tablets (1,000 mg total) by mouth every 6 (six) hours as needed for mild pain or fever. 30 tablet 0  . amLODipine (NORVASC) 10 MG tablet Take 10 mg by mouth daily.    Marland Kitchen HYDROcodone-acetaminophen (NORCO) 7.5-325 MG tablet Take 1 tablet by mouth every 4 to 6 hours as needed for pain 30 tablet 0  . HYDROcodone-acetaminophen (NORCO) 7.5-325 MG tablet Take 1 tablet by mouth every 4 (four) hours as needed for moderate pain (Must last 30 days.  Do not drive or operate machinery while taking this medicine.). 120 tablet 0  . lisinopril-hydrochlorothiazide (PRINZIDE,ZESTORETIC) 20-12.5 MG per tablet Take 1 tablet by mouth daily.    . pantoprazole (PROTONIX) 40 MG tablet Take 1 tablet (40 mg total) by mouth daily. 30 tablet 0  . pravastatin (PRAVACHOL) 40 MG tablet Take 40 mg by mouth daily.    Marland Kitchen tiZANidine (ZANAFLEX) 4 MG tablet  Take 4 mg by mouth every 6 (six) hours as needed for muscle spasms.    Marland Kitchen tiZANidine (ZANAFLEX) 4 MG tablet TAKE 1 TABLET BY MOUTH EVERY 8 HOURS AS NEEDED FOR SPASMS 90 tablet 3   No current facility-administered medications for this visit.     Physical Exam  Blood pressure 130/76, pulse 90, temperature 98.8 F (37.1 C), height 5\' 8"  (1.727 m), weight 194 lb (87.998 kg).  Constitutional: overall normal hygiene, normal nutrition, well developed, normal grooming, normal body habitus. Assistive device:none  Musculoskeletal: gait and station Limp none, muscle tone and strength are normal, no tremors or atrophy is present.  .  Neurological: coordination overall normal.  Deep tendon reflex/nerve stretch intact.  Sensation normal.  Cranial nerves II-XII intact.    Skin:   Scars of the right chest laterally upper, otherwise overall no scars, lesions, ulcers or rashes. No psoriasis.  Psychiatric: Alert and oriented x 3.  Recent memory intact, remote memory unclear.  Normal mood and affect. Well groomed.  Good eye contact.  Cardiovascular: overall no swelling, no varicosities, no edema bilaterally, normal temperatures of the legs and arms, no clubbing, cyanosis and good capillary refill.  Lymphatic: palpation is normal.  Examination of right Upper Extremity is done.  Inspection:   Overall:  Elbow non-tender without crepitus or defects, forearm non-tender without crepitus or defects, wrist non-tender without crepitus or defects, hand non-tender.    Shoulder: with glenohumeral joint tenderness, without effusion.   Upper arm: without swelling and tenderness   Range of motion:   Overall:  Full range of motion of the elbow, full range of motion of wrist and full range of motion in fingers.   Shoulder:  right  160 degrees forward flexion; 145 degrees abduction; 35 degrees internal rotation, 35 degrees external rotation, 20 degrees extension, 40 degrees adduction.   Stability:   Overall:  Shoulder, elbow and wrist stable   Strength and Tone:   Overall full shoulder muscles strength, full upper arm strength and normal upper arm bulk and tone.  Left shoulder negative.  The patient has been educated about the nature of the problem(s) and counseled on treatment options.  The patient appeared to understand what I have discussed and is in agreement with it.  Encounter Diagnoses  Name Primary?  . Right shoulder pain Yes  . Chronic obstructive pulmonary disease, unspecified COPD type ([place])     PLAN Call if any problems.  Precautions discussed.  Continue current medications.   Return to clinic 3 months

## 2015-10-25 ENCOUNTER — Other Ambulatory Visit: Payer: Self-pay | Admitting: Cardiothoracic Surgery

## 2015-10-25 DIAGNOSIS — R911 Solitary pulmonary nodule: Secondary | ICD-10-CM

## 2015-10-26 ENCOUNTER — Ambulatory Visit
Admission: RE | Admit: 2015-10-26 | Discharge: 2015-10-26 | Disposition: A | Discharge status: Home / Self Care | Payer: Medicare HMO | Source: Ambulatory Visit | Attending: Cardiothoracic Surgery | Admitting: Cardiothoracic Surgery

## 2015-10-26 ENCOUNTER — Encounter: Payer: Self-pay | Admitting: Cardiothoracic Surgery

## 2015-10-26 ENCOUNTER — Ambulatory Visit (INDEPENDENT_AMBULATORY_CARE_PROVIDER_SITE_OTHER): Payer: Self-pay | Admitting: Cardiothoracic Surgery

## 2015-10-26 VITALS — BP 134/78 | HR 80 | Resp 20 | Ht 68.0 in | Wt 194.0 lb

## 2015-10-26 DIAGNOSIS — R911 Solitary pulmonary nodule: Secondary | ICD-10-CM

## 2015-10-26 DIAGNOSIS — D381 Neoplasm of uncertain behavior of trachea, bronchus and lung: Secondary | ICD-10-CM

## 2015-10-26 MED ORDER — TRAMADOL HCL 50 MG PO TABS: 50.0000 mg | ORAL_TABLET | Freq: Two times a day (BID) | ORAL | Status: DC | PRN

## 2015-10-26 NOTE — Progress Notes (Signed)
[address]       [place], [address]             [phone]      John Bridges [facility] Medical Record [id number] Date of Birth: 01-17-1956  Referring: Melvenia Needles, NP Primary Care: Antionette Fairy, PA-C  Chief Complaint:   POST OP FOLLOW UP 09/25/2015  OPERATIVE REPORT PREOPERATIVE DIAGNOSIS: Right upper lobe hypermetabolic lung lesion. POSTOPERATIVE DIAGNOSIS: Right upper lobe hypermetabolic lung lesion. No malignancy identified by frozen section. PROCEDURE PERFORMED: Bronchoscopy, right video-assisted thoracoscopy, mini thoracotomy, wedge resection of right upper lobe, lymph node sampling, placement of On-Q device. SURGEON: Lanelle Bal, MD.  1. Lung, wedge biopsy/resection, right upper lobe - INTERSTITIAL AND PLEURAL FIBROSIS WITH SCATTERED FIBROBLASTIC FOCI. - GRANULOMATOUS INFLAMMATION, FOCAL. - THERE IS NO EVIDENCE OF MALIGNANCY. - SEE COMMENT. 2. Lymph node, biopsy, 4R - THERE IS NO EVIDENCE OF CARCINOMA IN 1 OF 1 LYMPH NODE (0/1). 3. Lymph node, biopsy, 10R - THERE IS NO EVIDENCE OF CARCINOMA IN 1 OF 1 LYMPH NODE (0/1). Microscopic Comment 1. No malignant features are identified. The findings suggest a chronic inflammatory/infectious process. Clinical and radiological correlation is necessary. (JBK:ds 09/26/15) Enid Cutter MD  History of Present Illness:     Patient returns today in follow-up after recent right upper lobe wedge resection, which was found on final pathology to be granulomatous inflammation. Patient has some chest wall and anterior abdominal wall discomfort but otherwise is making good progress postoperatively.    Past Medical History  Diagnosis Date  . Essential hypertension   . Hyperlipidemia   . Lung nodule     Right upper lobe lung nodule 1.3 cm   . COPD (chronic obstructive pulmonary disease) ([place])   . Shortness of breath dyspnea   . History of bronchitis   . Depression      History  Smoking  status  . Former Smoker -- 1.00 packs/day for 40 years  . Types: Cigarettes  . Quit date: 08/16/2015  Smokeless tobacco  . Not on file    History  Alcohol Use  . 0.0 oz/week  . 0 Standard drinks or equivalent per week    Comment: 3-4 times a week     No Known Allergies  Current Outpatient Prescriptions  Medication Sig Dispense Refill  . acetaminophen (TYLENOL) 500 MG tablet Take 2 tablets (1,000 mg total) by mouth every 6 (six) hours as needed for mild pain or fever. 30 tablet 0  . amLODipine (NORVASC) 10 MG tablet Take 10 mg by mouth daily.    Marland Kitchen HYDROcodone-acetaminophen (NORCO) 7.5-325 MG tablet Take 1 tablet by mouth every 4 (four) hours as needed for moderate pain (Must last 30 days.  Do not drive or operate machinery while taking this medicine.). 120 tablet 0  . lisinopril-hydrochlorothiazide (PRINZIDE,ZESTORETIC) 20-12.5 MG per tablet Take 1 tablet by mouth daily.    . pantoprazole (PROTONIX) 40 MG tablet Take 1 tablet (40 mg total) by mouth daily. 30 tablet 0  . pravastatin (PRAVACHOL) 40 MG tablet Take 40 mg by mouth daily.    Marland Kitchen tiZANidine (ZANAFLEX) 4 MG tablet TAKE 1 TABLET BY MOUTH EVERY 8 HOURS AS NEEDED FOR SPASMS 90 tablet 3  . traMADol (ULTRAM) 50 MG tablet Take 50 mg by mouth every 12 (twelve) hours as needed.     No current facility-administered medications for this visit.       Physical Exam: BP 134/78 mmHg  Pulse  80  Resp 20  Ht 5\' 8"  (1.727 m)  Wt 194 lb (87.998 kg)  BMI 29.50 kg/m2  SpO2 93%  General appearance: alert and cooperative Neurologic: intact Heart: regular rate and rhythm, S1, S2 normal, no murmur, click, rub or gallop Lungs: clear to auscultation bilaterally Abdomen: soft, non-tender; bowel sounds normal; no masses,  no organomegaly Extremities: extremities normal, atraumatic, no cyanosis or edema and Homans sign is negative, no sign of DVT Wound: Right chest incisions and chest tube/port sites are well healed   Diagnostic Studies &  Laboratory data:     Recent Radiology Findings:   Dg Chest 2 View  10/26/2015  CLINICAL DATA:  History of right lung surgery on May 8th, right upper lobe wedge resection. Now with cough and right lower anterior rib pain. EXAM: CHEST  2 VIEW COMPARISON:  Chest x-rays dated 09/30/2015, 09/29/2015 and 09/25/2015. Comparison also to chest CT of 08/02/2015. FINDINGS: Heart size is normal. Overall cardiomediastinal silhouette is stable in size and configuration. Postsurgical changes again noted within the right upper lobe. Lungs otherwise clear. No residual pneumothorax appreciated. Lungs again noted to be hyperexpanded indicating COPD. No pleural effusion. Osseous structures about the chest are unremarkable. IMPRESSION: 1. Postsurgical changes within the right upper lobe, status post right upper lobe wedge dissection. No evidence of surgical complicating feature. No residual pneumothorax seen. 2. Hyperexpanded lungs indicating COPD. Suspect associated chronic bronchitic changes centrally. 3. No acute findings. Electronically Signed   By: Franki Cabot M.D.   On: 10/26/2015 12:51      Recent Lab Findings: Lab Results  Component Value Date   WBC 9.5 09/28/2015   HGB 10.9* 09/28/2015   HCT 32.2* 09/28/2015   PLT 202 09/28/2015   GLUCOSE 103* 09/28/2015   ALT 19 09/27/2015   AST 22 09/27/2015   NA 139 09/28/2015   K 4.0 09/28/2015   CL 102 09/28/2015   CREATININE 0.75 09/28/2015   BUN 11 09/28/2015   CO2 29 09/28/2015   INR 1.10 09/21/2015      Assessment / Plan:     Patient doing well following wedge resection of right upper lobe lung lesion granulating, inflammation We'll plan to see him back in 6 weeks with a follow-up chest x-ray, he was given prescription for Ultram 30 tablets for chest wall discomfort 4-5 months following surgery we will repeat CT scan of the chest.   Grace Isaac MD      [address] [place] [address] Office [phone]   Beeper  [phone]  10/26/2015 1:44 PM

## 2015-10-31 ENCOUNTER — Telehealth: Payer: Self-pay | Admitting: Orthopaedic Surgery

## 2015-10-31 MED ORDER — HYDROCODONE-ACETAMINOPHEN 7.5-325 MG PO TABS: 1.0000 | ORAL_TABLET | ORAL | Status: DC | PRN

## 2015-10-31 NOTE — Telephone Encounter (Signed)
Hydrocodone-Acetaminophen 7.5/325mg Qty 120 Tablets °

## 2015-10-31 NOTE — Telephone Encounter (Signed)
Rx done. 

## 2015-11-07 LAB — ACID FAST CULTURE WITH REFLEXED SENSITIVITIES (MYCOBACTERIA): Acid Fast Culture: NEGATIVE

## 2015-11-24 ENCOUNTER — Other Ambulatory Visit: Payer: Medicare HMO

## 2015-11-24 ENCOUNTER — Ambulatory Visit (INDEPENDENT_AMBULATORY_CARE_PROVIDER_SITE_OTHER): Payer: Medicare HMO | Admitting: Internal Medicine

## 2015-11-24 ENCOUNTER — Encounter: Payer: Self-pay | Admitting: Internal Medicine

## 2015-11-24 VITALS — BP 146/82 | HR 81 | Ht 68.0 in | Wt 195.6 lb

## 2015-11-24 DIAGNOSIS — R911 Solitary pulmonary nodule: Secondary | ICD-10-CM | POA: Diagnosis not present

## 2015-11-24 DIAGNOSIS — J449 Chronic obstructive pulmonary disease, unspecified: Secondary | ICD-10-CM | POA: Diagnosis not present

## 2015-11-24 DIAGNOSIS — F17210 Nicotine dependence, cigarettes, uncomplicated: Secondary | ICD-10-CM

## 2015-11-24 MED ORDER — ONDANSETRON HCL 4 MG PO TABS: 4.0000 mg | ORAL_TABLET | ORAL | Status: DC | PRN

## 2015-11-24 NOTE — Assessment & Plan Note (Signed)
Healing incision to right upper posterior chest. Residual Nerve pain. Plan: Continued follow up per surgery.

## 2015-11-24 NOTE — Assessment & Plan Note (Signed)
Moderate COPD, minimal symptoms  Continue to use your [employer] Inhaler as needed for shortness of breath or wheezing up to evey 6 hours. Please call Dr Servando Snare for Tramadol refill. We will have Dr. Chase Caller complete Handicapped Placard for your wife. We will check labs today. ( ACE, ANKA, MPO/PR3) Follow up in 6 months with Dr. Chase Caller Please contact office for sooner follow up if symptoms do not improve or worsen or seek emergency care

## 2015-11-24 NOTE — Patient Instructions (Addendum)
It is nice to meet you today. The number for the ICU is [phone]. Please call Dr Servando Snare for Tramadol refill. We will have Dr. Chase Caller complete Handicapped Placard for your wife. We will check labs today. ( ACE, ANCA, MPO/PR3) Follow up in 6 months with Dr. Chase Caller Please contact office for sooner follow up if symptoms do not improve or worsen or seek emergency care

## 2015-11-24 NOTE — Progress Notes (Signed)
History of Present Illness John Bridges is a 60 y.o. male smoker seen for pulmonary consult in 07/2015 for bronchitis and COPD. Right upper lobe wedge resection for granulomatous mass 09/2015.  09/25/2015 OPERATIVE REPORT PREOPERATIVE DIAGNOSIS: Right upper lobe hypermetabolic lung lesion. POSTOPERATIVE DIAGNOSIS: Right upper lobe hypermetabolic lung lesion. No malignancy identified by frozen section. PROCEDURE PERFORMED: Bronchoscopy, right video-assisted thoracoscopy, mini thoracotomy, wedge resection of right upper lobe, lymph node sampling, placement of On-Q device. SURGEON: John Bal, MD.  1. Lung, wedge biopsy/resection, right upper lobe - INTERSTITIAL AND PLEURAL FIBROSIS WITH SCATTERED FIBROBLASTIC FOCI. - GRANULOMATOUS INFLAMMATION, FOCAL. - THERE IS NO EVIDENCE OF MALIGNANCY. - SEE COMMENT. 2. Lymph node, biopsy, 4R - THERE IS NO EVIDENCE OF CARCINOMA IN 1 OF 1 LYMPH NODE (0/1). 3. Lymph node, biopsy, 10R - THERE IS NO EVIDENCE OF CARCINOMA IN 1 OF 1 LYMPH NODE (0/1). Microscopic Comment 1. No malignant features are identified. The findings suggest a chronic inflammatory/infectious process. Clinical and radiological correlation is necessary. (JBK:ds 09/26/15) John KISH MD   recent right upper lobe wedge resection, which was found on final pathology to be granulomatous inflammation no evidence of malignancy.  11/24/2015 Pt. Presents today for follow up after right upper lobe lobectomy for a lung mass that was non-malignant granulomatous inflammation on 09/25/2015.He is healing from the surgery. His incision and chest tube sites have healed well.He is having some nerve pain, which Dr. Servando Bridges is managing.He has quit smoking as of August 17, 2015. He states his breathing has been stable.He has not had to use his Pro Air since his surgery.Denies fever, cough, chest pain, increase in sputum, purulent sputum.   . Tests 08/02/2015 IMPRESSION: Irregular nodular opacity in  the posterior right upper lobe, volumetric mean approximately 13.8 mm, with extension to the pleural surface. While unusual apical pleural-parenchymal scarring is possible, neoplasm is not excluded. By size criteria, this reflects a Lung Rads 4A lesion.  Lung-RADS Category 4A, suspicious. Follow up low-dose chest CT without contrast in 3 months (please use the following order, "CT CHEST LCS NODULE FOLLOW-  Past medical hx Past Medical History  Diagnosis Date  . Essential hypertension   . Hyperlipidemia   . Lung nodule     Right upper lobe lung nodule 1.3 cm   . COPD (chronic obstructive pulmonary disease) ([place])   . Shortness of breath dyspnea   . History of bronchitis   . Depression      Past surgical hx, Family hx, Social hx all reviewed.  Current Outpatient Prescriptions on File Prior to Visit  Medication Sig  . acetaminophen (TYLENOL) 500 MG tablet Take 2 tablets (1,000 mg total) by mouth every 6 (six) hours as needed for mild pain or fever.  Marland Kitchen amLODipine (NORVASC) 10 MG tablet Take 10 mg by mouth daily.  Marland Kitchen HYDROcodone-acetaminophen (NORCO) 7.5-325 MG tablet Take 1 tablet by mouth every 4 (four) hours as needed for moderate pain (Must last 30 days.  Do not drive or operate machinery while taking this medicine.).  Marland Kitchen lisinopril-hydrochlorothiazide (PRINZIDE,ZESTORETIC) 20-12.5 MG per tablet Take 1 tablet by mouth daily.  . pantoprazole (PROTONIX) 40 MG tablet Take 1 tablet (40 mg total) by mouth daily.  . pravastatin (PRAVACHOL) 40 MG tablet Take 40 mg by mouth daily.  Marland Kitchen tiZANidine (ZANAFLEX) 4 MG tablet TAKE 1 TABLET BY MOUTH EVERY 8 HOURS AS NEEDED FOR SPASMS  . traMADol (ULTRAM) 50 MG tablet Take 1 tablet (50 mg total) by mouth every 12 (twelve) hours as  needed.   No current facility-administered medications on file prior to visit.     No Known Allergies  Review Of Systems:  Constitutional:   No  weight loss, night sweats,  Fevers, chills, fatigue, or   lassitude.  HEENT:   No headaches,  Difficulty swallowing,  Tooth/dental problems, or  Sore throat,                No sneezing, itching, ear ache, nasal congestion, post nasal drip,   CV:  No chest pain,  Orthopnea, PND, swelling in lower extremities, anasarca, dizziness, palpitations, syncope.   GI  No heartburn, indigestion, abdominal pain, nausea, vomiting, diarrhea, change in bowel habits, loss of appetite, bloody stools.   Resp: No shortness of breath with exertion or at rest.  No excess mucus, no productive cough,  No non-productive cough,  No coughing up of blood.  No change in color of mucus.  No wheezing.  No chest wall deformity  Skin: no rash or lesions.  GU: no dysuria, change in color of urine, no urgency or frequency.  No flank pain, no hematuria   MS:  No joint pain or swelling.  No decreased range of motion.  No back pain.  Psych:  No change in mood or affect. No depression or anxiety.  No memory loss.   Vital Signs BP 146/82 mmHg  Pulse 81  Ht 5\' 8"  (1.727 m)  Wt 195 lb 9.6 oz (88.724 kg)  BMI 29.75 kg/m2  SpO2 96%   Physical Exam:  General- No distress,  A&Ox3, pleasant ENT: No sinus tenderness, TM clear, pale nasal mucosa, no oral exudate,no post nasal drip, no LAN Cardiac: S1, S2, regular rate and rhythm, no murmur Chest: No wheeze/ rales/ dullness; no accessory muscle use, no nasal flaring, no sternal retractions Abd.: Soft Non-tender Ext: No clubbing cyanosis, edema Neuro:  normal strength Skin: No rashes, warm and dry, right sided scar 2/2 recent wedge resection, healed Psych: normal mood and behavior   Assessment/Plan  COPD (chronic obstructive pulmonary disease) (HCC) Moderate COPD, minimal symptoms  Continue to use your [employer] Inhaler as needed for shortness of breath or wheezing up to evey 6 hours. Please call Dr John Bridges for Tramadol refill. We will have Dr. Chase Bridges complete Handicapped Placard for your wife. We will check labs today. (  ACE, ANKA, MPO/PR3) Follow up in 6 months with Dr. Chase Bridges Please contact office for sooner follow up if symptoms do not improve or worsen or seek emergency care    Nodule of right lung Healing incision to right upper posterior chest. Residual Nerve pain. Plan: Continued follow up per surgery.     Magdalen Spatz, NP 11/24/2015  2:41 PM      STAFF NOTE: I, Dr Ann Lions have personally reviewed patient's available data, including medical history, events of note, physical examination and test results as part of my evaluation. I have discussed with resident/NP and other care providers such as pharmacist, RN and RRT.  In addition,  I personally evaluated patient and elicited key findings of   S: feeling improved after sugery. Bx showed granuloma after lung resection./ Quit smoking  - having morning "nerves" and nausea  O: Looks well Oriented x 3   A: lung nodule - benign granuloma Copd stable\ Smoking - remission; recently quit. Having withdrawal  P: granulomatous nodule - check ACE, ANCA COPD - per APP Smoking remission but withdrawal - zofran prn for nausea. No CNS pills from [place]   .  Rest per NP/medical resident whose note is outlined above and that I agree with     Dr. Brand Males, M.D., [facility].C.P Pulmonary and Critical Care Medicine Staff Physician [facility] Pager: [phone], If no answer or between  15:00h - 7:00h: call [phone]  11/24/2015 5:24 PM

## 2015-11-27 ENCOUNTER — Telehealth: Payer: Self-pay | Admitting: Internal Medicine

## 2015-11-27 LAB — ANCA SCREEN W REFLEX TITER: ANCA Screen: NEGATIVE

## 2015-11-27 LAB — ANGIOTENSIN CONVERTING ENZYME: Angiotensin-Converting Enzyme: 16 U/L (ref 8–52)

## 2015-11-27 NOTE — Telephone Encounter (Signed)
I called spoke with pt. He reports MR told him he was going to RX xanax for him. I do not see this documented in last OV note. Please advise MW thanks

## 2015-11-27 NOTE — Telephone Encounter (Signed)
This is not a pulmonary medication and was not addressed by his pulmonary doctor in the notes so best to defer to [facility]

## 2015-11-27 NOTE — Telephone Encounter (Signed)
Accidentally forwarded to MW by mistake. Please advise MR thanks

## 2015-11-28 LAB — MPO/PR-3 (ANCA) ANTIBODIES
Myeloperoxidase Abs: <1
Serine Protease 3: <1

## 2015-11-28 NOTE — Telephone Encounter (Signed)
Spoke with pt,aware of recs.  Nothing further needed.  

## 2015-11-28 NOTE — Telephone Encounter (Signed)
No - sarah groce told him to talk to pcp BAUCOM, JENNY B, PA-C about it. We only agreed to do zofran for nausea related to smoking wd

## 2015-11-29 ENCOUNTER — Telehealth: Payer: Self-pay | Admitting: Orthopaedic Surgery

## 2015-11-29 MED ORDER — HYDROCODONE-ACETAMINOPHEN 7.5-325 MG PO TABS: 1.0000 | ORAL_TABLET | ORAL | Status: DC | PRN

## 2015-11-29 NOTE — Telephone Encounter (Signed)
Hydrocodone-Acetaminophen 7.5/325mg Qty 120 Tablets °

## 2015-11-29 NOTE — Telephone Encounter (Signed)
Rx done. 

## 2015-12-04 ENCOUNTER — Telehealth: Payer: Self-pay | Admitting: Internal Medicine

## 2015-12-04 NOTE — Telephone Encounter (Signed)
Attempted to call pt but line is busy.  Will try back.

## 2015-12-05 NOTE — Telephone Encounter (Signed)
Called and spoke with pts wife and she is aware of results per pt.

## 2015-12-06 ENCOUNTER — Other Ambulatory Visit: Payer: Self-pay | Admitting: Cardiothoracic Surgery

## 2015-12-06 DIAGNOSIS — R911 Solitary pulmonary nodule: Secondary | ICD-10-CM

## 2015-12-07 ENCOUNTER — Ambulatory Visit: Payer: Medicare HMO | Admitting: Cardiothoracic Surgery

## 2015-12-11 NOTE — Progress Notes (Signed)
Cardiology Office Note  Date: 12/12/2015   ID: POE RINIKER, DOB 08/11/1955, MRN [id number]  PCP: Antionette Fairy PA-C  Primary Cardiologist: Rozann Lesches, MD   Chief Complaint  Patient presents with  . Coronary Artery Disease    History of Present Illness: John Bridges is a 60 y.o. male seen in consultation back in April of this year. Please refer to the previous note for details. He was seen at that time for preoperative evaluation prior to resection of lung nodule. Cardiolite from earlier in the year is outlined below. He ultimately underwent a right upper lobe wedge resection with finding on pathology of a granulomatous mass without evidence of malignancy. He has stopped smoking. Follow-up continues with Pulmonary, seen recently for ongoing management of COPD.  He presents today with no angina symptoms. He had undergone prior cardiac workup before surgery which showed evidence of ischemic heart disease with inferior wall scar in mild peri-infarct ischemia, normal LVEF. Current medical therapy include aspirin, Norvasc, Prinzide, and Pravachol. We have discussed continuing observation for now in the absence of angina.  I congratulated him on stopping smoking. He is doing well without any significant cravings or relapses so far.  Blood pressure is adequately controlled  Past Medical History:  Diagnosis Date  . COPD (chronic obstructive pulmonary disease) ([place])   . Depression   . Essential hypertension   . History of bronchitis   . Hyperlipidemia   . Lung nodule    Right upper lobe lung nodule 1.3 cm   . Shortness of breath dyspnea     Past Surgical History:  Procedure Laterality Date  . COLONOSCOPY    . LEG SURGERY Left   . THORACOTOMY Right 09/25/2015   Procedure: MINI/LIMITED THORACOTOMY WITH RIGHT UPPER LOBE WEDGE RESECTION;  Surgeon: Grace Isaac, MD;  Location: [place] [facility];  Service: Thoracic;  Laterality: Right;  . TOOTH EXTRACTION    . VIDEO BRONCHOSCOPY N/A  09/25/2015   Procedure: VIDEO BRONCHOSCOPY;  Surgeon: Grace Isaac, MD;  Location: [facility] OR;  Service: Thoracic;  Laterality: N/A;    Current Outpatient Prescriptions  Medication Sig Dispense Refill  . acetaminophen (TYLENOL) 500 MG tablet Take 2 tablets (1,000 mg total) by mouth every 6 (six) hours as needed for mild pain or fever. 30 tablet 0  . amLODipine (NORVASC) 10 MG tablet Take 10 mg by mouth daily.    Marland Kitchen HYDROcodone-acetaminophen (NORCO) 7.5-325 MG tablet Take 1 tablet by mouth every 4 (four) hours as needed for moderate pain (Must last 30 days.  Do not drive or operate machinery while taking this medicine.). 120 tablet 0  . lisinopril-hydrochlorothiazide (PRINZIDE,ZESTORETIC) 20-12.5 MG per tablet Take 1 tablet by mouth daily.    . ondansetron (ZOFRAN) 4 MG tablet Take 1 tablet (4 mg total) by mouth as needed for nausea or vomiting. 10 tablet 0  . pantoprazole (PROTONIX) 40 MG tablet Take 1 tablet (40 mg total) by mouth daily. 30 tablet 0  . pravastatin (PRAVACHOL) 40 MG tablet Take 40 mg by mouth daily.    Marland Kitchen tiZANidine (ZANAFLEX) 4 MG tablet TAKE 1 TABLET BY MOUTH EVERY 8 HOURS AS NEEDED FOR SPASMS 90 tablet 3  . traMADol (ULTRAM) 50 MG tablet Take 1 tablet (50 mg total) by mouth every 12 (twelve) hours as needed. 30 tablet 0   No current facility-administered medications for this visit.    Allergies:  Review of patient's allergies indicates no known allergies.   Social History: The patient  reports that he quit smoking about 3 months ago. His smoking use included Cigarettes. He has a 40.00 pack-year smoking history. He has quit using smokeless tobacco. He reports that he drinks alcohol. He reports that he does not use drugs.  ROS:  Please see the history of present illness. Otherwise, complete review of systems is positive for productive cough in the morning.  All other systems are reviewed and negative.   Physical Exam: VS:  BP 132/72   Pulse 93   Ht 5\' 9"  (1.753 m)   Wt 193 lb  (87.5 kg)   SpO2 93%   BMI 28.50 kg/m , BMI Body mass index is 28.5 kg/m.  Wt Readings from Last 3 Encounters:  12/12/15 193 lb (87.5 kg)  11/24/15 195 lb 9.6 oz (88.7 kg)  10/26/15 194 lb (88 kg)    General: Patient appears comfortable at rest. HEENT: Conjunctiva and lids normal, oropharynx clear with poor dentition. Neck: Supple, no elevated JVP or carotid bruits, no thyromegaly. Lungs: Diminished breath sounds without wheezing, nonlabored breathing at rest. Thorax: Healing right upper lateral chest incisions. Cardiac: Regular rate and rhythm, no S3 or significant systolic murmur, no pericardial rub. Abdomen: Soft, nontender, bowel sounds present, no guarding or rebound. Extremities: No pitting edema, distal pulses 2+. Skin: Warm and dry. Musculoskeletal: No kyphosis. Neuropsychiatric: Alert and oriented x3, affect grossly appropriate.  ECG: I personally reviewed the tracing from 08/28/2015 which showed normal sinus rhythm.  Recent Labwork: 09/27/2015: ALT 19; AST 22 09/28/2015: BUN 11; Creatinine, Ser 0.75; Hemoglobin 10.9; Platelets 202; Potassium 4.0; Sodium 139   Other Studies Reviewed Today:  Lexiscan Cardiolite 08/31/2015:  There was no ST segment deviation noted during stress.  The left ventricular ejection fraction is normal (55-65%).  Findings consistent with prior moderate inferior myocardial infarction with mild peri-infarct ischemia.  Overall low to intermediate risk study. Moderate size prior infarct with fairly mild amount of myocardium currently at jeopardy.  Echocardiogram 08/31/2015: Study Conclusions  - Left ventricle: The cavity size was normal. Wall thickness was   increased in a pattern of mild LVH. Systolic function was normal.   The estimated ejection fraction was in the range of 55% to 60%.   Wall motion was normal; there were no regional wall motion   abnormalities. Left ventricular diastolic function parameters   were normal. - Aortic valve:  Mildly to moderately calcified annulus. Trileaflet;   mildly thickened leaflets. Valve area (VTI): 2.36 cm^2. Valve   area (Vmax): 2.32 cm^2. - Mitral valve: There was mild regurgitation. - Technically adequate study.  Assessment and Plan:  1. Ischemic heart disease based on noninvasive imaging with evidence of inferior scar and mild peri-infarct ischemia, normal LVEF. He does not report any angina symptoms and we will continue medical therapy and observation.  2. History of lung nodule now status post right upper lobe wedge resection with finding on pathology of a granulomatous mass without evidence of malignancy. Has underlying COPD with history of tobacco abuse and continues to follow with Pulmonary.  3. History tobacco abuse in remission, quit smoking recently.  4. Essential hypertension, blood pressure is adequately controlled.  Current medicines were reviewed with the patient today.  Disposition: Follow-up with me in 6 months.  Signed, Satira Sark, MD, [facility] 12/12/2015 10:34 AM    [place] [facility] [place]. [address], [place], [place] [address] Phone: [phone]; Fax: [phone]

## 2015-12-12 ENCOUNTER — Ambulatory Visit (INDEPENDENT_AMBULATORY_CARE_PROVIDER_SITE_OTHER): Payer: Medicare HMO | Admitting: Cardiology

## 2015-12-12 ENCOUNTER — Encounter: Payer: Self-pay | Admitting: Cardiology

## 2015-12-12 VITALS — BP 132/72 | HR 93 | Ht 69.0 in | Wt 193.0 lb

## 2015-12-12 DIAGNOSIS — I2510 Atherosclerotic heart disease of native coronary artery without angina pectoris: Secondary | ICD-10-CM

## 2015-12-12 DIAGNOSIS — I10 Essential (primary) hypertension: Secondary | ICD-10-CM

## 2015-12-12 DIAGNOSIS — F17201 Nicotine dependence, unspecified, in remission: Secondary | ICD-10-CM

## 2015-12-12 DIAGNOSIS — R9439 Abnormal result of other cardiovascular function study: Secondary | ICD-10-CM

## 2015-12-12 DIAGNOSIS — R911 Solitary pulmonary nodule: Secondary | ICD-10-CM | POA: Diagnosis not present

## 2015-12-12 NOTE — Patient Instructions (Signed)
Your physician wants you to follow-up in: 6 months You will receive a reminder letter in the mail two months in advance. If you don't receive a letter, please call our office to schedule the follow-up appointment.     Your physician recommends that you continue on your current medications as directed. Please refer to the Current Medication list given to you today.      Thank you for choosing [facility] !        

## 2015-12-28 ENCOUNTER — Telehealth: Payer: Self-pay | Admitting: Orthopaedic Surgery

## 2015-12-28 NOTE — Telephone Encounter (Signed)
Hydrocodone-Acetaminophen 7.5/325mg Qty 120 Tablets °

## 2016-01-01 MED ORDER — HYDROCODONE-ACETAMINOPHEN 7.5-325 MG PO TABS: 1.0000 | ORAL_TABLET | ORAL | 0 refills | Status: DC | PRN

## 2016-01-17 ENCOUNTER — Ambulatory Visit (INDEPENDENT_AMBULATORY_CARE_PROVIDER_SITE_OTHER): Payer: Medicare HMO

## 2016-01-17 ENCOUNTER — Ambulatory Visit (INDEPENDENT_AMBULATORY_CARE_PROVIDER_SITE_OTHER): Payer: Medicare HMO | Admitting: Orthopaedic Surgery

## 2016-01-17 ENCOUNTER — Encounter: Payer: Self-pay | Admitting: Orthopaedic Surgery

## 2016-01-17 VITALS — BP 137/84 | HR 95 | Temp 97.5°F | Ht 67.0 in | Wt 196.0 lb

## 2016-01-17 DIAGNOSIS — M542 Cervicalgia: Secondary | ICD-10-CM

## 2016-01-17 DIAGNOSIS — M25511 Pain in right shoulder: Secondary | ICD-10-CM | POA: Diagnosis not present

## 2016-01-17 DIAGNOSIS — J449 Chronic obstructive pulmonary disease, unspecified: Secondary | ICD-10-CM

## 2016-01-17 NOTE — Progress Notes (Signed)
Patient John Bridges, male DOB:Jul 12, 1955, 60 y.o. [id number]  Chief Complaint  Patient presents with  . Follow-up    right shoulder pain    HPI  John Bridges is a 60 y.o. male who has chronic right shoulder pain.  He has been doing his exercises.  He has no redness.  He has had paresthesias of the right hand recently with pain shooting from his neck to his long finger and some to the index finger.  He has awakened with numbness of just the long finger.  He has tried his medicine, heat, ice and rubs with no help. HPI  Body mass index is 30.7 kg/m.  ROS  Review of Systems  Constitutional:       Patient does not have Diabetes Mellitus. Patient has hypertension. Patient has COPD or shortness of breath. Patient does not have BMI > 35. Patient has current smoking history.  HENT: Negative for congestion.   Respiratory: Positive for shortness of breath. Negative for cough.   Cardiovascular: Negative for chest pain and leg swelling.  Endocrine: Negative for cold intolerance.  Musculoskeletal: Positive for arthralgias and myalgias.  Allergic/Immunologic: Negative for environmental allergies.    Past Medical History:  Diagnosis Date  . COPD (chronic obstructive pulmonary disease) ([place])   . Depression   . Essential hypertension   . History of bronchitis   . Hyperlipidemia   . Lung nodule    Right upper lobe lung nodule 1.3 cm   . Shortness of breath dyspnea     Past Surgical History:  Procedure Laterality Date  . COLONOSCOPY    . LEG SURGERY Left   . THORACOTOMY Right 09/25/2015   Procedure: MINI/LIMITED THORACOTOMY WITH RIGHT UPPER LOBE WEDGE RESECTION;  Surgeon: Grace Isaac, MD;  Location: [facility] [place];  Service: Thoracic;  Laterality: Right;  . TOOTH EXTRACTION    . VIDEO BRONCHOSCOPY N/A 09/25/2015   Procedure: VIDEO BRONCHOSCOPY;  Surgeon: Grace Isaac, MD;  Location: [facility] OR;  Service: Thoracic;  Laterality: N/A;    Family History  Problem Relation Age of  Onset  . Hypertension Mother   . Hypertension Father   . Cancer Brother   . CAD Brother   . Stroke Brother     Social History Social History  Substance Use Topics  . Smoking status: Former Smoker    Packs/day: 1.00    Years: 40.00    Types: Cigarettes    Quit date: 08/16/2015  . Smokeless tobacco: Former Systems developer  . Alcohol use 0.0 oz/week     Comment: 3-4 times a week    No Known Allergies  Current Outpatient Prescriptions  Medication Sig Dispense Refill  . acetaminophen (TYLENOL) 500 MG tablet Take 2 tablets (1,000 mg total) by mouth every 6 (six) hours as needed for mild pain or fever. 30 tablet 0  . amLODipine (NORVASC) 10 MG tablet Take 10 mg by mouth daily.    Marland Kitchen HYDROcodone-acetaminophen (NORCO) 7.5-325 MG tablet Take 1 tablet by mouth every 4 (four) hours as needed for moderate pain (Must last 30 days.  Do not drive or operate machinery while taking this medicine.). 120 tablet 0  . lisinopril-hydrochlorothiazide (PRINZIDE,ZESTORETIC) 20-12.5 MG per tablet Take 1 tablet by mouth daily.    . ondansetron (ZOFRAN) 4 MG tablet Take 1 tablet (4 mg total) by mouth as needed for nausea or vomiting. 10 tablet 0  . pantoprazole (PROTONIX) 40 MG tablet Take 1 tablet (40 mg total) by mouth daily. 30 tablet 0  .  pravastatin (PRAVACHOL) 40 MG tablet Take 40 mg by mouth daily.    Marland Kitchen tiZANidine (ZANAFLEX) 4 MG tablet TAKE 1 TABLET BY MOUTH EVERY 8 HOURS AS NEEDED FOR SPASMS 90 tablet 3  . traMADol (ULTRAM) 50 MG tablet Take 1 tablet (50 mg total) by mouth every 12 (twelve) hours as needed. 30 tablet 0   No current facility-administered medications for this visit.      Physical Exam  Blood pressure 137/84, pulse 95, temperature 97.5 F (36.4 C), height 5\' 7"  (1.702 m), weight 196 lb (88.9 kg).  Constitutional: overall normal hygiene, normal nutrition, well developed, normal grooming, normal body habitus. Assistive device:none  Musculoskeletal: gait and station Limp none, muscle tone  and strength are normal, no tremors or atrophy is present.  .  Neurological: coordination overall normal.  Deep tendon reflex/nerve stretch intact.  Sensation normal.  Cranial nerves II-XII intact.   Skin:   normal overall no scars, lesions, ulcers or rashes. No psoriasis.  Psychiatric: Alert and oriented x 3.  Recent memory intact, remote memory unclear.  Normal mood and affect. Well groomed.  Good eye contact.  Cardiovascular: overall no swelling, no varicosities, no edema bilaterally, normal temperatures of the legs and arms, no clubbing, cyanosis and good capillary refill.  Lymphatic: palpation is normal.  Examination of right Upper Extremity is done.  Inspection:   Overall:  Elbow non-tender without crepitus or defects, forearm non-tender without crepitus or defects, wrist non-tender without crepitus or defects, hand non-tender.    Shoulder: with glenohumeral joint tenderness, without effusion.   Upper arm: without swelling and tenderness   Range of motion:   Overall:  Full range of motion of the elbow, full range of motion of wrist and full range of motion in fingers.   Shoulder:  right  165 degrees forward flexion; 145 degrees abduction; 35 degrees internal rotation, 35 degrees external rotation, 20 degrees extension, 40 degrees adduction.   Stability:   Overall:  Shoulder, elbow and wrist stable   Strength and Tone:   Overall full shoulder muscles strength, full upper arm strength and normal upper arm bulk and tone.  He has full ROM of his neck.  He has normal reflexes and grip.  The patient has been educated about the nature of the problem(s) and counseled on treatment options.  The patient appeared to understand what I have discussed and is in agreement with it.  Encounter Diagnoses  Name Primary?  . Cervicalgia Yes  . Right shoulder pain   . Chronic obstructive pulmonary disease, unspecified COPD type ([place])    X-rays were made of the cervical spine, reported  separately.  PLAN Call if any problems.  Precautions discussed.  Continue current medications.   Return to clinic 3 months   He may need MRI of neck if not improved.  Electronically Signed Sanjuana Kava, MD 8/30/201710:50 AM

## 2016-01-30 ENCOUNTER — Telehealth: Payer: Self-pay | Admitting: Orthopaedic Surgery

## 2016-01-30 ENCOUNTER — Encounter: Payer: Self-pay | Admitting: Orthopaedic Surgery

## 2016-01-30 MED ORDER — HYDROCODONE-ACETAMINOPHEN 7.5-325 MG PO TABS: 1.0000 | ORAL_TABLET | Freq: Four times a day (QID) | ORAL | 0 refills | Status: DC | PRN

## 2016-01-30 NOTE — Telephone Encounter (Signed)
Patient requests refill: HYDROcodone-acetaminophen (NORCO) 7.5-325 MG tablet (last received quantity of120) - insurance is [employer]

## 2016-02-14 ENCOUNTER — Telehealth: Payer: Self-pay | Admitting: Orthopaedic Surgery

## 2016-02-28 ENCOUNTER — Telehealth: Payer: Self-pay | Admitting: Orthopaedic Surgery

## 2016-02-28 MED ORDER — HYDROCODONE-ACETAMINOPHEN 7.5-325 MG PO TABS: 1.0000 | ORAL_TABLET | Freq: Four times a day (QID) | ORAL | 0 refills | Status: DC | PRN

## 2016-02-28 NOTE — Telephone Encounter (Signed)
Patient requests a refill on Hydrocodone/Acetaminophen (Norco)  7.5-325 mgs.  Qty  110  Sig: Take 1 tablet by mouth every 6 (six) hours as needed for moderate pain (Must last 30 days.Do not drive or operate machinery while taking this medicine.).

## 2016-02-29 ENCOUNTER — Ambulatory Visit (INDEPENDENT_AMBULATORY_CARE_PROVIDER_SITE_OTHER): Payer: Medicare HMO | Admitting: Cardiothoracic Surgery

## 2016-02-29 ENCOUNTER — Ambulatory Visit
Admission: RE | Admit: 2016-02-29 | Discharge: 2016-02-29 | Disposition: A | Discharge status: Home / Self Care | Payer: Medicare HMO | Source: Ambulatory Visit | Attending: Cardiothoracic Surgery | Admitting: Cardiothoracic Surgery

## 2016-02-29 ENCOUNTER — Encounter: Payer: Self-pay | Admitting: Cardiothoracic Surgery

## 2016-02-29 ENCOUNTER — Encounter: Payer: Self-pay | Admitting: Orthopaedic Surgery

## 2016-02-29 VITALS — BP 143/84 | HR 87 | Resp 18 | Ht 67.0 in | Wt 196.0 lb

## 2016-02-29 DIAGNOSIS — Z09 Encounter for follow-up examination after completed treatment for conditions other than malignant neoplasm: Secondary | ICD-10-CM | POA: Diagnosis not present

## 2016-02-29 DIAGNOSIS — R911 Solitary pulmonary nodule: Secondary | ICD-10-CM

## 2016-02-29 DIAGNOSIS — J8410 Pulmonary fibrosis, unspecified: Secondary | ICD-10-CM | POA: Diagnosis not present

## 2016-02-29 NOTE — Progress Notes (Signed)
[address]       [place] [address]             [phone]      Shone L Steffy [place] Medical Record [id number] Date of Birth: [birth year]  Referring: Melvenia Needles, NP Primary Care: Antionette Fairy, PA-C  Chief Complaint:   POST OP FOLLOW UP 09/25/2015  OPERATIVE REPORT PREOPERATIVE DIAGNOSIS: Right upper lobe hypermetabolic lung lesion. POSTOPERATIVE DIAGNOSIS: Right upper lobe hypermetabolic lung lesion. No malignancy identified by frozen section. PROCEDURE PERFORMED: Bronchoscopy, right video-assisted thoracoscopy, mini thoracotomy, wedge resection of right upper lobe, lymph node sampling, placement of On-Q device. SURGEON: Lanelle Bal, MD.  1. Lung, wedge biopsy/resection, right upper lobe - INTERSTITIAL AND PLEURAL FIBROSIS WITH SCATTERED FIBROBLASTIC FOCI. - GRANULOMATOUS INFLAMMATION, FOCAL. - THERE IS NO EVIDENCE OF MALIGNANCY. - SEE COMMENT. 2. Lymph node, biopsy, 4R - THERE IS NO EVIDENCE OF CARCINOMA IN 1 OF 1 LYMPH NODE (0/1). 3. Lymph node, biopsy, 10R - THERE IS NO EVIDENCE OF CARCINOMA IN 1 OF 1 LYMPH NODE (0/1). Microscopic Comment 1. No malignant features are identified. The findings suggest a chronic inflammatory/infectious process. Clinical and radiological correlation is necessary. (JBK:ds 09/26/15) Enid Cutter MD  History of Present Illness:     Patient returns today in follow-up after recent right upper lobe wedge resection, which was found on final pathology to be granulomatous inflammation.  Patient has some chest wall and anterior abdominal wall discomfort , several months ago he noted a popping sensation in the right lower chest separate from his previous incision after an episode of sneezing.   Past Medical History:  Diagnosis Date  . COPD (chronic obstructive pulmonary disease) ([place])   . Depression   . Essential hypertension   . History of bronchitis   . Hyperlipidemia   . Lung nodule    Right  upper lobe lung nodule 1.3 cm   . Shortness of breath dyspnea      History  Smoking Status  . Former Smoker  . Packs/day: 1.00  . Years: 40.00  . Types: Cigarettes  . Quit date: 08/16/2015  Smokeless Tobacco  . Former User    History  Alcohol Use  . 0.0 oz/week    Comment: 3-4 times a week     No Known Allergies  Current Outpatient Prescriptions  Medication Sig Dispense Refill  . acetaminophen (TYLENOL) 500 MG tablet Take 2 tablets (1,000 mg total) by mouth every 6 (six) hours as needed for mild pain or fever. 30 tablet 0  . amLODipine (NORVASC) 10 MG tablet Take 10 mg by mouth daily.    Marland Kitchen HYDROcodone-acetaminophen (NORCO) 7.5-325 MG tablet Take 1 tablet by mouth every 6 (six) hours as needed for moderate pain (Must last 30 days.Do not drive or operate machinery while taking this medicine.). 100 tablet 0  . lisinopril-hydrochlorothiazide (PRINZIDE,ZESTORETIC) 20-12.5 MG per tablet Take 1 tablet by mouth daily.    . ondansetron (ZOFRAN) 4 MG tablet Take 1 tablet (4 mg total) by mouth as needed for nausea or vomiting. 10 tablet 0  . pantoprazole (PROTONIX) 40 MG tablet Take 1 tablet (40 mg total) by mouth daily. 30 tablet 0  . pravastatin (PRAVACHOL) 40 MG tablet Take 40 mg by mouth daily.    Marland Kitchen tiZANidine (ZANAFLEX) 4 MG tablet Take 1 tablet (4 mg total) by mouth at bedtime. 30 tablet 1   No current facility-administered medications for this visit.  Physical Exam: BP (!) 143/84   Pulse 87   Resp 18   Ht 5\' 7"  (1.702 m)   Wt 196 lb (88.9 kg)   SpO2 98% Comment: ON RA  BMI 30.70 kg/m   General appearance: alert and cooperative Neurologic: intact Heart: regular rate and rhythm, S1, S2 normal, no murmur, click, rub or gallop Lungs: clear to auscultation bilaterally Abdomen: soft, non-tender; bowel sounds normal; no masses,  no organomegaly Extremities: extremities normal, atraumatic, no cyanosis or edema and Homans sign is negative, no sign of DVT Wound:  Right chest incisions and chest tube/port sites are well healed Is no palpable tenderness over the right chest wall  Diagnostic Studies & Laboratory data:     Recent Radiology Findings:   Dg Chest 2 View  Result Date: 02/29/2016 CLINICAL DATA:  Patient underwent wedge resection of the right upper lobe in May of 2017. The patient felt a pop in the chest 6 weeks ago in the right upper quadrant and now feels a knot in that region. EXAM: CHEST  2 VIEW COMPARISON:  Chest x-ray of October 26, 2015 FINDINGS: The lungs are mildly hyperinflated with hemidiaphragm flattening. There is no pneumothorax, pneumomediastinum, or pleural effusion. There is stable increased density in the right upper hemi thorax associated with a suture line. The heart and mediastinal structures are normal. There is calcification in the wall of the aortic arch. The bony thorax exhibits no acute abnormality. IMPRESSION: COPD. Post right upper lobe wedge resection changes, stable. No pneumothorax nor other acute cardiopulmonary abnormality. Aortic atherosclerosis. Electronically Signed   By: David  Martinique M.D.   On: 02/29/2016 14:35      Recent Lab Findings: Lab Results  Component Value Date   WBC 9.5 09/28/2015   HGB 10.9 (L) 09/28/2015   HCT 32.2 (L) 09/28/2015   PLT 202 09/28/2015   GLUCOSE 103 (H) 09/28/2015   ALT 19 09/27/2015   AST 22 09/27/2015   NA 139 09/28/2015   K 4.0 09/28/2015   CL 102 09/28/2015   CREATININE 0.75 09/28/2015   BUN 11 09/28/2015   CO2 29 09/28/2015   INR 1.10 09/21/2015      Assessment / Plan:   Patient doing well following wedge resection of right upper lobe lung lesion granulating, inflammation We'll plan on obtaining a follow-up CT scan about 6 months postop., This would be in November 2017  Grace Isaac MD      [address] [place] [address] Office [phone]   Beeper [phone]  02/29/2016 3:02 PM

## 2016-03-13 ENCOUNTER — Other Ambulatory Visit: Payer: Self-pay | Admitting: Cardiothoracic Surgery

## 2016-03-13 DIAGNOSIS — R911 Solitary pulmonary nodule: Secondary | ICD-10-CM

## 2016-03-26 ENCOUNTER — Telehealth: Payer: Self-pay | Admitting: Orthopaedic Surgery

## 2016-03-27 MED ORDER — HYDROCODONE-ACETAMINOPHEN 7.5-325 MG PO TABS: 1.0000 | ORAL_TABLET | Freq: Four times a day (QID) | ORAL | 0 refills | Status: DC | PRN

## 2016-04-01 ENCOUNTER — Telehealth: Payer: Self-pay | Admitting: Internal Medicine

## 2016-04-01 NOTE — Telephone Encounter (Signed)
Pt states that he is wanting to change the location of his MRI to [facility].  Pt was scheduled to have this done at [facility] and the cost was going to too much - Pt was scheduled for MRI on 04/04/16. Please advise [name]. Thanks.

## 2016-04-02 NOTE — Telephone Encounter (Signed)
Pt wife returning call wanting to know if MR would order test [phone].Hillery Hunter

## 2016-04-02 NOTE — Telephone Encounter (Signed)
My mistake. The patient has been notified, states that he will contact Dr Servando Snare if he decides to have the scan done. Nothing further needed.

## 2016-04-02 NOTE — Telephone Encounter (Signed)
Dr Servando Snare ordered CT (not MRI) and his office will need to be the one to reschedule.  I called pt & left him a vm to call me back.

## 2016-04-04 ENCOUNTER — Other Ambulatory Visit: Payer: Medicare HMO

## 2016-04-04 ENCOUNTER — Ambulatory Visit: Payer: Medicare HMO | Admitting: Cardiothoracic Surgery

## 2016-04-17 ENCOUNTER — Ambulatory Visit (INDEPENDENT_AMBULATORY_CARE_PROVIDER_SITE_OTHER): Payer: Medicare HMO | Admitting: Orthopaedic Surgery

## 2016-04-17 VITALS — BP 178/93 | HR 84 | Ht 67.0 in | Wt 194.0 lb

## 2016-04-17 DIAGNOSIS — M25512 Pain in left shoulder: Secondary | ICD-10-CM

## 2016-04-17 DIAGNOSIS — G8929 Other chronic pain: Secondary | ICD-10-CM | POA: Diagnosis not present

## 2016-04-17 DIAGNOSIS — M25511 Pain in right shoulder: Secondary | ICD-10-CM

## 2016-04-17 NOTE — Progress Notes (Signed)
Patient RG:[id number] L John Livings., male DOB:09-11-1955, 60 y.o. John Bridges  Chief Complaint  Patient presents with  . Follow-up    Bilateral shoulder pain    HPI  John JEANLOUIS Sr. is a 60 y.o. male who has bilateral shoulder pain.  He is stable.  The right shoulder hurts more than the left. He is doing his exercises. He is taking his medicine.  He has no new trauma.  He has no paresthesias. HPI  Body mass index is 30.38 kg/m.  ROS  Review of Systems  Constitutional:       Patient does not have Diabetes Mellitus. Patient has hypertension. Patient has COPD or shortness of breath. Patient does not have BMI > 35. Patient has current smoking history.  HENT: Negative for congestion.   Respiratory: Positive for shortness of breath. Negative for cough.   Cardiovascular: Negative for chest pain and leg swelling.  Endocrine: Negative for cold intolerance.  Musculoskeletal: Positive for arthralgias and myalgias.  Allergic/Immunologic: Negative for environmental allergies.    Past Medical History:  Diagnosis Date  . COPD (chronic obstructive pulmonary disease) ([place])   . Depression   . Essential hypertension   . History of bronchitis   . Hyperlipidemia   . Lung nodule    Right upper lobe lung nodule 1.3 cm   . Shortness of breath dyspnea     Past Surgical History:  Procedure Laterality Date  . COLONOSCOPY    . LEG SURGERY Left   . THORACOTOMY Right 09/25/2015   Procedure: MINI/LIMITED THORACOTOMY WITH RIGHT UPPER LOBE WEDGE RESECTION;  Surgeon: Grace Isaac, MD;  Location: [facility];  Service: Thoracic;  Laterality: Right;  . TOOTH EXTRACTION    . VIDEO BRONCHOSCOPY N/A 09/25/2015   Procedure: VIDEO BRONCHOSCOPY;  Surgeon: Grace Isaac, MD;  Location: [facility] OR;  Service: Thoracic;  Laterality: N/A;    Family History  Problem Relation Age of Onset  . Hypertension Mother   . Hypertension Father   . Cancer Brother   . CAD Brother   . Stroke Brother     Social  History Social History  Substance Use Topics  . Smoking status: Former Smoker    Packs/day: 1.00    Years: 40.00    Types: Cigarettes    Quit date: 08/16/2015  . Smokeless tobacco: Former Systems developer  . Alcohol use 0.0 oz/week     Comment: 3-4 times a week    No Known Allergies  Current Outpatient Prescriptions  Medication Sig Dispense Refill  . acetaminophen (TYLENOL) 500 MG tablet Take 2 tablets (1,000 mg total) by mouth every 6 (six) hours as needed for mild pain or fever. 30 tablet 0  . amLODipine (NORVASC) 10 MG tablet Take 10 mg by mouth daily.    Marland Kitchen HYDROcodone-acetaminophen (NORCO) 7.5-325 MG tablet Take 1 tablet by mouth every 6 (six) hours as needed for moderate pain (Must last 30 days.Do not drive or operate machinery while taking this medicine.). 100 tablet 0  . lisinopril-hydrochlorothiazide (PRINZIDE,ZESTORETIC) 20-12.5 MG per tablet Take 1 tablet by mouth daily.    . ondansetron (ZOFRAN) 4 MG tablet Take 1 tablet (4 mg total) by mouth as needed for nausea or vomiting. 10 tablet 0  . pantoprazole (PROTONIX) 40 MG tablet Take 1 tablet (40 mg total) by mouth daily. 30 tablet 0  . pravastatin (PRAVACHOL) 40 MG tablet Take 40 mg by mouth daily.    Marland Kitchen tiZANidine (ZANAFLEX) 4 MG tablet Take 1 tablet (4 mg total) by mouth  at bedtime. 30 tablet 1   No current facility-administered medications for this visit.      Physical Exam  Blood pressure (!) 178/93, pulse 84, height 5\' 7"  (1.702 m), weight 194 lb (88 kg).  Constitutional: overall normal hygiene, normal nutrition, well developed, normal grooming, normal body habitus. Assistive device:none  Musculoskeletal: gait and station Limp none, muscle tone and strength are normal, no tremors or atrophy is present.  .  Neurological: coordination overall normal.  Deep tendon reflex/nerve stretch intact.  Sensation normal.  Cranial nerves II-XII intact.   Skin:   Normal overall no scars, lesions, ulcers or rashes. No  psoriasis.  Psychiatric: Alert and oriented x 3.  Recent memory intact, remote memory unclear.  Normal mood and affect. Well groomed.  Good eye contact.  Cardiovascular: overall no swelling, no varicosities, no edema bilaterally, normal temperatures of the legs and arms, no clubbing, cyanosis and good capillary refill.  Lymphatic: palpation is normal.  Examination of bilaterally Upper Extremity is done.  Inspection:   Overall:  Elbow non-tender without crepitus or defects, forearm non-tender without crepitus or defects, wrist non-tender without crepitus or defects, hand non-tender.    Shoulder: with glenohumeral joint tenderness, without effusion.   Upper arm: without swelling and tenderness   Range of motion:   Overall:  Full range of motion of the elbow, full range of motion of wrist and full range of motion in fingers.   Shoulder:  bilaterally  165 degrees forward flexion; 145 degrees abduction; 35 degrees internal rotation, 35 degrees external rotation, 15 degrees extension, 40 degrees adduction.   Stability:   Overall:  Shoulder, elbow and wrist stable   Strength and Tone:   Overall full shoulder muscles strength, full upper arm strength and normal upper arm bulk and tone.   The patient has been educated about the nature of the problem(s) and counseled on treatment options.  The patient appeared to understand what I have discussed and is in agreement with it.  Encounter Diagnoses  Name Primary?  . Chronic right shoulder pain Yes  . Chronic left shoulder pain     PLAN Call if any problems.  Precautions discussed.  Continue current medications.   Return to clinic 3 months   Electronically Signed Sanjuana Kava, MD 11/29/201710:28 AM

## 2016-04-18 ENCOUNTER — Telehealth: Payer: Self-pay | Admitting: Orthopaedic Surgery

## 2016-04-23 ENCOUNTER — Telehealth: Payer: Self-pay | Admitting: Orthopaedic Surgery

## 2016-04-24 MED ORDER — HYDROCODONE-ACETAMINOPHEN 7.5-325 MG PO TABS: 1.0000 | ORAL_TABLET | Freq: Four times a day (QID) | ORAL | 0 refills | Status: DC | PRN

## 2016-05-08 ENCOUNTER — Ambulatory Visit
Admission: RE | Admit: 2016-05-08 | Discharge: 2016-05-08 | Disposition: A | Discharge status: Home / Self Care | Payer: Medicare HMO | Source: Ambulatory Visit | Attending: Cardiothoracic Surgery | Admitting: Cardiothoracic Surgery

## 2016-05-08 ENCOUNTER — Ambulatory Visit (INDEPENDENT_AMBULATORY_CARE_PROVIDER_SITE_OTHER): Payer: Medicare HMO | Admitting: Cardiothoracic Surgery

## 2016-05-08 ENCOUNTER — Encounter: Payer: Self-pay | Admitting: Cardiothoracic Surgery

## 2016-05-08 VITALS — BP 130/81 | HR 99 | Resp 20 | Ht 67.0 in | Wt 193.0 lb

## 2016-05-08 DIAGNOSIS — J8410 Pulmonary fibrosis, unspecified: Secondary | ICD-10-CM | POA: Diagnosis not present

## 2016-05-08 DIAGNOSIS — R911 Solitary pulmonary nodule: Secondary | ICD-10-CM

## 2016-05-08 DIAGNOSIS — Z09 Encounter for follow-up examination after completed treatment for conditions other than malignant neoplasm: Secondary | ICD-10-CM | POA: Diagnosis not present

## 2016-05-08 NOTE — Progress Notes (Addendum)
[address]       [place] [address]             [phone]      Breion L Brunetti Sr. [place] Medical Record [id number] Date of Birth: 1956-03-26  Referring: Melvenia Needles, NP Primary Care: Antionette Fairy, PA-C  Chief Complaint:   POST OP FOLLOW UP 09/25/2015  OPERATIVE REPORT PREOPERATIVE DIAGNOSIS: Right upper lobe hypermetabolic lung lesion. POSTOPERATIVE DIAGNOSIS: Right upper lobe hypermetabolic lung lesion. No malignancy identified by frozen section. PROCEDURE PERFORMED: Bronchoscopy, right video-assisted thoracoscopy, mini thoracotomy, wedge resection of right upper lobe, lymph node sampling, placement of On-Q device. SURGEON: Lanelle Bal, MD.  1. Lung, wedge biopsy/resection, right upper lobe - INTERSTITIAL AND PLEURAL FIBROSIS WITH SCATTERED FIBROBLASTIC FOCI. - GRANULOMATOUS INFLAMMATION, FOCAL. - THERE IS NO EVIDENCE OF MALIGNANCY. - SEE COMMENT. 2. Lymph node, biopsy, 4R - THERE IS NO EVIDENCE OF CARCINOMA IN 1 OF 1 LYMPH NODE (0/1). 3. Lymph node, biopsy, 10R - THERE IS NO EVIDENCE OF CARCINOMA IN 1 OF 1 LYMPH NODE (0/1). Microscopic Comment 1. No malignant features are identified. The findings suggest a chronic inflammatory/infectious process. Clinical and radiological correlation is necessary. (JBK:ds 09/26/15) Enid Cutter MD  History of Present Illness:     Patient returns today in follow-up after right upper lobe wedge resection, which was found on final pathology to be granulomatous inflammation.  Patient has some chest wall and anterior abdominal wall discomfort , several months ago he noted a popping sensation in the right lower chest separate from his previous incision after an episode of sneezing. He notes he was trying to kill wasp nest when injuried.   Past Medical History:  Diagnosis Date  . COPD (chronic obstructive pulmonary disease) ([place])   . Depression   . Essential hypertension   . History of  bronchitis   . Hyperlipidemia   . Lung nodule    Right upper lobe lung nodule 1.3 cm   . Shortness of breath dyspnea      History  Smoking Status  . Former Smoker  . Packs/day: 1.00  . Years: 40.00  . Types: Cigarettes  . Quit date: 08/16/2015  Smokeless Tobacco  . Former User    History  Alcohol Use  . 0.0 oz/week    Comment: 3-4 times a week     No Known Allergies  Current Outpatient Prescriptions  Medication Sig Dispense Refill  . acetaminophen (TYLENOL) 500 MG tablet Take 2 tablets (1,000 mg total) by mouth every 6 (six) hours as needed for mild pain or fever. 30 tablet 0  . amLODipine (NORVASC) 10 MG tablet Take 10 mg by mouth daily.    Marland Kitchen HYDROcodone-acetaminophen (NORCO) 7.5-325 MG tablet Take 1 tablet by mouth every 6 (six) hours as needed for moderate pain (Must last 30 days.Do not drive or operate machinery while taking this medicine.). 100 tablet 0  . lisinopril-hydrochlorothiazide (PRINZIDE,ZESTORETIC) 20-12.5 MG per tablet Take 1 tablet by mouth daily.    . ondansetron (ZOFRAN) 4 MG tablet Take 1 tablet (4 mg total) by mouth as needed for nausea or vomiting. 10 tablet 0  . pantoprazole (PROTONIX) 40 MG tablet Take 1 tablet (40 mg total) by mouth daily. 30 tablet 0  . pravastatin (PRAVACHOL) 40 MG tablet Take 40 mg by mouth daily.    Marland Kitchen tiZANidine (ZANAFLEX) 4 MG tablet TAKE 1 TABLET(4 MG) BY MOUTH AT BEDTIME 30 tablet 0   No current facility-administered  medications for this visit.        Physical Exam: BP 130/81   Pulse 99   Resp 20   Ht 5\' 7"  (1.702 m)   Wt 193 lb (87.5 kg)   SpO2 93%   BMI 30.23 kg/m   General appearance: alert and cooperative Neurologic: intact Heart: regular rate and rhythm, S1, S2 normal, no murmur, click, rub or gallop Lungs: clear to auscultation bilaterally Abdomen: soft, non-tender; bowel sounds normal; no masses,  no organomegaly Extremities: extremities normal, atraumatic, no cyanosis or edema and Homans sign is  negative, no sign of DVT Wound: Right chest incisions and chest tube/port sites are well healed   Diagnostic Studies & Laboratory data:     Recent Radiology Findings:   Ct Chest Wo Contrast  Result Date: 05/08/2016 CLINICAL DATA:  Shortness breath. Cough. Mildly hypermetabolic right upper lobe lung nodule, wedge resection revealed fibrosis and granulomatous response. EXAM: CT CHEST WITHOUT CONTRAST TECHNIQUE: Multidetector CT imaging of the chest was performed following the standard protocol without IV contrast. COMPARISON:  Multiple exams, including 08/02/2015 and 08/14/2015 FINDINGS: Cardiovascular: Coronary, aortic arch, and branch vessel atherosclerotic vascular disease. Mediastinum/Nodes: Borderline right hilar nodal prominence. Lungs/Pleura: Interval wedge resection of the right upper lobe nodule with bandlike density noted. No recurrent mass. Centrilobular emphysema.  No new nodule. Bilateral airway thickening. Upper Abdomen: 1.3 cm peripancreatic lymph node image 132/3, only partially included on today' s exam. Previously 1.2 cm on PET-CT of 08/13/2013. Musculoskeletal: Nonunion of right fifth rib osteotomy/fracture. IMPRESSION: 1. Interval wedge resection of the right upper lobe nodule. No recurrent or new nodule. 2. Centrilobular emphysema with bilateral airway thickening. 3. Nonunion of right fifth rib osteotomy/fracture. 4. Mildly enlarged but essentially stable right peripancreatic lymph node. 5. Coronary, aortic arch, and branch vessel atherosclerotic vascular disease. Electronically Signed   By: Van Clines M.D.   On: 05/08/2016 15:56      Recent Lab Findings: Lab Results  Component Value Date   WBC 9.5 09/28/2015   HGB 10.9 (L) 09/28/2015   HCT 32.2 (L) 09/28/2015   PLT 202 09/28/2015   GLUCOSE 103 (H) 09/28/2015   ALT 19 09/27/2015   AST 22 09/27/2015   NA 139 09/28/2015   K 4.0 09/28/2015   CL 102 09/28/2015   CREATININE 0.75 09/28/2015   BUN 11 09/28/2015   CO2  29 09/28/2015   INR 1.10 09/21/2015      Assessment / Plan:   Patient doing well following wedge resection of right upper lobe lung lesion granulating, inflammation We'll plan on obtaining a follow-up CT scan about 6 months postop., This would be in June  2018, If negative return to Yearly lung cancer screening program Patient has stopped smoking  Grace Isaac MD      [address] [place] [address] Office [phone]   Beeper [phone]  05/08/2016 8:35 PM

## 2016-05-16 ENCOUNTER — Other Ambulatory Visit: Payer: Self-pay | Admitting: Orthopaedic Surgery

## 2016-05-23 ENCOUNTER — Telehealth: Payer: Self-pay | Admitting: Orthopaedic Surgery

## 2016-05-23 MED ORDER — HYDROCODONE-ACETAMINOPHEN 7.5-325 MG PO TABS: 1.0000 | ORAL_TABLET | Freq: Four times a day (QID) | ORAL | 0 refills | Status: DC | PRN

## 2016-05-23 NOTE — Telephone Encounter (Signed)
Denied.  No anti-spasm medicine for long term use.

## 2016-05-23 NOTE — Telephone Encounter (Signed)
Tizanidine (Zanafles) 4 mg  Qty 30 Tablets

## 2016-05-24 ENCOUNTER — Other Ambulatory Visit: Payer: Self-pay | Admitting: Orthopaedic Surgery

## 2016-05-27 NOTE — Telephone Encounter (Signed)
Denied.  Use of anti-spasm medicine not recommended.

## 2016-06-05 ENCOUNTER — Ambulatory Visit: Payer: Medicare HMO | Admitting: Internal Medicine

## 2016-06-20 ENCOUNTER — Telehealth: Payer: Self-pay | Admitting: Orthopaedic Surgery

## 2016-06-20 ENCOUNTER — Ambulatory Visit: Payer: Medicare HMO | Admitting: Internal Medicine

## 2016-06-24 ENCOUNTER — Ambulatory Visit (INDEPENDENT_AMBULATORY_CARE_PROVIDER_SITE_OTHER): Payer: Medicare HMO | Admitting: Internal Medicine

## 2016-06-24 ENCOUNTER — Encounter: Payer: Self-pay | Admitting: Internal Medicine

## 2016-06-24 VITALS — BP 142/84 | HR 87 | Ht 67.0 in | Wt 201.4 lb

## 2016-06-24 DIAGNOSIS — J449 Chronic obstructive pulmonary disease, unspecified: Secondary | ICD-10-CM | POA: Diagnosis not present

## 2016-06-24 MED ORDER — PREDNISONE 10 MG PO TABS: ORAL_TABLET | ORAL | 0 refills | Status: DC

## 2016-06-24 MED ORDER — HYDROCODONE-ACETAMINOPHEN 7.5-325 MG PO TABS: 1.0000 | ORAL_TABLET | Freq: Four times a day (QID) | ORAL | 0 refills | Status: DC | PRN

## 2016-06-24 MED ORDER — GLYCOPYRROLATE-FORMOTEROL 9-4.8 MCG/ACT IN AERO: 2.0000 | INHALATION_SPRAY | Freq: Two times a day (BID) | RESPIRATORY_TRACT | 0 refills | Status: DC

## 2016-06-24 NOTE — Progress Notes (Signed)
Subjective:     Patient ID: John Bridges., male   DOB: 1955/09/08, 61 y.o.   MRN: [id number]  HPI  OV 06/24/2016  Chief Complaint  Patient presents with  . Follow-up    Pt states his SOB has worsened since last OV. Pt denies cough and CP/tightness    T61-year-old male previous smoker with Gold stage II COPD FEV1 2.19 L/64% postbronchodilator in March 2017. He status post wedge resection of the right upper lobe granulomatous PET hot nodule in May 2017. He then had a single follow-up December 2017. He now presents for routine follow-up. He tells me for the last several months he's got dyspnea and exertion for climbing stairs or doing heavy activities. He somewhat noncompliant with his inhalers. He does use only albuterol as needed with any says he does not feel like he needs it though he is complaining of shortness of breath. He's not on any maintenance inhalers. He is refused flu shot because he feels he gets a flu but having the flu shot and a flu vaccine is ineffective.  His wife Kirsten Lafarga is my office patient and he feels that only he should be contacted directly on the cell phone for any of his office visit because his wife has a tendency to cancel his visits with me   has a past medical history of COPD (chronic obstructive pulmonary disease) ([place]); Depression; Essential hypertension; History of bronchitis; Hyperlipidemia; Lung nodule; and Shortness of breath dyspnea.   reports that he quit smoking about 10 months ago. His smoking use included Cigarettes. He has a 40.00 pack-year smoking history. He has quit using smokeless tobacco.  Past Surgical History:  Procedure Laterality Date  . COLONOSCOPY    . LEG SURGERY Left   . THORACOTOMY Right 09/25/2015   Procedure: MINI/LIMITED THORACOTOMY WITH RIGHT UPPER LOBE WEDGE RESECTION;  Surgeon: Grace Isaac, MD;  Location: [facility];  Service: Thoracic;  Laterality: Right;  . TOOTH EXTRACTION    . VIDEO BRONCHOSCOPY N/A 09/25/2015   Procedure: VIDEO BRONCHOSCOPY;  Surgeon: Grace Isaac, MD;  Location: [facility];  Service: Thoracic;  Laterality: N/A;    No Known Allergies  There is no immunization history for the selected administration types on file for this patient.  Family History  Problem Relation Age of Onset  . Hypertension Mother   . Hypertension Father   . Cancer Brother   . CAD Brother   . Stroke Brother      Current Outpatient Prescriptions:  .  acetaminophen (TYLENOL) 500 MG tablet, Take 2 tablets (1,000 mg total) by mouth every 6 (six) hours as needed for mild pain or fever., Disp: 30 tablet, Rfl: 0 .  amLODipine (NORVASC) 10 MG tablet, Take 10 mg by mouth daily., Disp: , Rfl:  .  HYDROcodone-acetaminophen (NORCO) 7.5-325 MG tablet, Take 1 tablet by mouth every 6 (six) hours as needed for moderate pain (Must last 30 days.Do not drive or operate machinery while taking this medicine.)., Disp: 100 tablet, Rfl: 0 .  lisinopril-hydrochlorothiazide (PRINZIDE,ZESTORETIC) 20-12.5 MG per tablet, Take 1 tablet by mouth daily., Disp: , Rfl:  .  ondansetron (ZOFRAN) 4 MG tablet, Take 1 tablet (4 mg total) by mouth as needed for nausea or vomiting., Disp: 10 tablet, Rfl: 0 .  pantoprazole (PROTONIX) 40 MG tablet, Take 1 tablet (40 mg total) by mouth daily., Disp: 30 tablet, Rfl: 0 .  pravastatin (PRAVACHOL) 40 MG tablet, Take 40 mg by mouth daily., Disp: , Rfl:  Review of Systems     Objective:   Physical Exam  Constitutional: He is oriented to person, place, and time. He appears well-developed and well-nourished. No distress.  HENT:  Head: Normocephalic and atraumatic.  Right Ear: External ear normal.  Left Ear: External ear normal.  Mouth/Throat: Oropharynx is clear and moist. No oropharyngeal exudate.  Eyes: Conjunctivae and EOM are normal. Pupils are equal, round, and reactive to light. Right eye exhibits no discharge. Left eye exhibits no discharge. No scleral icterus.  Neck: Normal range of  motion. Neck supple. No JVD present. No tracheal deviation present. No thyromegaly present.  Cardiovascular: Normal rate, regular rhythm and intact distal pulses.  Exam reveals no gallop and no friction rub.   No murmur heard. Pulmonary/Chest: Effort normal. No respiratory distress. He has wheezes. He has no rales. He exhibits no tenderness.  Occasional faint wheezing present  Abdominal: Soft. Bowel sounds are normal. He exhibits no distension and no mass. There is no tenderness. There is no rebound and no guarding.  Musculoskeletal: Normal range of motion. He exhibits no edema or tenderness.  Lymphadenopathy:    He has no cervical adenopathy.  Neurological: He is alert and oriented to person, place, and time. He has normal reflexes. No cranial nerve deficit. Coordination normal.  Skin: Skin is warm and dry. No rash noted. He is not diaphoretic. No erythema. No pallor.  Psychiatric: He has a normal Bridges and affect. His behavior is normal. Judgment and thought content normal.  Nursing note and vitals reviewed.   Vitals:   06/24/16 1421  BP: (!) 142/84  Pulse: 87  SpO2: 96%  Weight: 201 lb 6.4 oz (91.4 kg)  Height: 5\' 7"  (1.702 m)     .    Assessment:       ICD-9-CM ICD-10-CM   1. Chronic obstructive pulmonary disease, unspecified COPD type ([place]) 496 J44.9        Plan:      Please take prednisone 40 mg x1 day, then 30 mg x1 day, then 20 mg x1 day, then 10 mg x1 day, and then 5 mg x1 day and stop   Start bevespi 2 puff twice daily - scheduled - daily  use Use albuterol as needed [facility] staff to note your cell as main number of contact  Followup - do PFT in 8 weeks -  Pre-bd spiro and dlco only. No lung volume or bd response. No post-bd spiro - return to see me or an APP in 8 weeks    Dr. Brand Males, M.D., [facility].C.P Pulmonary and Critical Care Medicine Staff Physician [facility] Pager: [phone], If no answer or  between  15:00h - 7:00h: call [phone]  06/24/2016 2:39 PM

## 2016-06-24 NOTE — Patient Instructions (Signed)
ICD-9-CM ICD-10-CM   1. Chronic obstructive pulmonary disease, unspecified COPD type ([place]) 496 J44.9     Please take prednisone 40 mg x1 day, then 30 mg x1 day, then 20 mg x1 day, then 10 mg x1 day, and then 5 mg x1 day and stop   Start bevespi 2 puff twice daily - scheduled - daily  use Use albuterol as needed [facility] staff to note your cell as main number of contact  Followup - do PFT in 8 weeks -  Pre-bd spiro and dlco only. No lung volume or bd response. No post-bd spiro - return to see me or an APP in 8 weeks

## 2016-06-24 NOTE — Addendum Note (Signed)
Addended by: Len Blalock on: 06/24/2016 02:49 PM   Modules accepted: Orders

## 2016-06-24 NOTE — Addendum Note (Signed)
Addended by: Len Blalock on: 06/24/2016 02:45 PM   Modules accepted: Orders

## 2016-07-17 ENCOUNTER — Ambulatory Visit (INDEPENDENT_AMBULATORY_CARE_PROVIDER_SITE_OTHER): Payer: Medicare HMO | Admitting: Orthopaedic Surgery

## 2016-07-17 VITALS — BP 152/80 | HR 89 | Temp 98.2°F | Ht 67.0 in | Wt 203.0 lb

## 2016-07-17 DIAGNOSIS — M25512 Pain in left shoulder: Secondary | ICD-10-CM

## 2016-07-17 DIAGNOSIS — M25511 Pain in right shoulder: Secondary | ICD-10-CM | POA: Diagnosis not present

## 2016-07-17 DIAGNOSIS — G8929 Other chronic pain: Secondary | ICD-10-CM

## 2016-07-17 MED ORDER — HYDROCODONE-ACETAMINOPHEN 7.5-325 MG PO TABS: 1.0000 | ORAL_TABLET | Freq: Four times a day (QID) | ORAL | 0 refills | Status: DC | PRN

## 2016-07-17 NOTE — Progress Notes (Signed)
Patient [id number] L John Livings., male DOB:[birth year], 61 y.o. [id number]  Chief Complaint  Patient presents with  . Follow-up    Bilateral shoulder pain    HPI  John HOIT Sr. is a 61 y.o. male who has chronic pain in both shoulders, right more than left.  He has no paresthesias, no trauma, no redness.  HPI  Body mass index is 31.79 kg/m.  ROS  Review of Systems  Constitutional:       Patient does not have Diabetes Mellitus. Patient has hypertension. Patient has COPD or shortness of breath. Patient does not have BMI > 35. Patient has current smoking history.  HENT: Negative for congestion.   Respiratory: Positive for shortness of breath. Negative for cough.   Cardiovascular: Negative for chest pain and leg swelling.  Endocrine: Negative for cold intolerance.  Musculoskeletal: Positive for arthralgias and myalgias.  Allergic/Immunologic: Negative for environmental allergies.    Past Medical History:  Diagnosis Date  . COPD (chronic obstructive pulmonary disease) ([name])   . Depression   . Essential hypertension   . History of bronchitis   . Hyperlipidemia   . Lung nodule    Right upper lobe lung nodule 1.3 cm   . Shortness of breath dyspnea     Past Surgical History:  Procedure Laterality Date  . COLONOSCOPY    . LEG SURGERY Left   . THORACOTOMY Right 09/25/2015   Procedure: MINI/LIMITED THORACOTOMY WITH RIGHT UPPER LOBE WEDGE RESECTION;  Surgeon: Grace Isaac, MD;  Location: [place];  Service: Thoracic;  Laterality: Right;  . TOOTH EXTRACTION    . VIDEO BRONCHOSCOPY N/A 09/25/2015   Procedure: VIDEO BRONCHOSCOPY;  Surgeon: Grace Isaac, MD;  Location: [facility] OR;  Service: Thoracic;  Laterality: N/A;    Family History  Problem Relation Age of Onset  . Hypertension Mother   . Hypertension Father   . Cancer Brother   . CAD Brother   . Stroke Brother     Social History Social History  Substance Use Topics  . Smoking status: Former Smoker    Packs/day:  1.00    Years: 40.00    Types: Cigarettes    Quit date: 08/16/2015  . Smokeless tobacco: Former Systems developer  . Alcohol use 0.0 oz/week     Comment: 3-4 times a week    No Known Allergies  Current Outpatient Prescriptions  Medication Sig Dispense Refill  . acetaminophen (TYLENOL) 500 MG tablet Take 2 tablets (1,000 mg total) by mouth every 6 (six) hours as needed for mild pain or fever. 30 tablet 0  . amLODipine (NORVASC) 10 MG tablet Take 10 mg by mouth daily.    . Glycopyrrolate-Formoterol (BEVESPI AEROSPHERE) 9-4.8 MCG/ACT AERO Inhale 2 puffs into the lungs 2 (two) times daily. 3 Inhaler 0  . HYDROcodone-acetaminophen (NORCO) 7.5-325 MG tablet Take 1 tablet by mouth every 6 (six) hours as needed for moderate pain (Must last 30 days.Do not drive or operate machinery while taking this medicine.). 100 tablet 0  . lisinopril-hydrochlorothiazide (PRINZIDE,ZESTORETIC) 20-12.5 MG per tablet Take 1 tablet by mouth daily.    . ondansetron (ZOFRAN) 4 MG tablet Take 1 tablet (4 mg total) by mouth as needed for nausea or vomiting. 10 tablet 0  . pantoprazole (PROTONIX) 40 MG tablet Take 1 tablet (40 mg total) by mouth daily. 30 tablet 0  . pravastatin (PRAVACHOL) 40 MG tablet Take 40 mg by mouth daily.    . predniSONE (DELTASONE) 10 MG tablet 40mg  X1 day, 30mg  X1  day, 20mg  X1 day, 10mg  X1 day, 5mg  X1 day. 11 tablet 0   No current facility-administered medications for this visit.      Physical Exam  Blood pressure (!) 152/80, pulse 89, temperature 98.2 F (36.8 C), height 5\' 7"  (1.702 m), weight 203 lb (92.1 kg).  Constitutional: overall normal hygiene, normal nutrition, well developed, normal grooming, normal body habitus. Assistive device:none  Musculoskeletal: gait and station Limp none, muscle tone and strength are normal, no tremors or atrophy is present.  .  Neurological: coordination overall normal.  Deep tendon reflex/nerve stretch intact.  Sensation normal.  Cranial nerves II-XII intact.    Skin:   Normal overall no scars, lesions, ulcers or rashes. No psoriasis.  Psychiatric: Alert and oriented x 3.  Recent memory intact, remote memory unclear.  Normal mood and affect. Well groomed.  Good eye contact.  Cardiovascular: overall no swelling, no varicosities, no edema bilaterally, normal temperatures of the legs and arms, no clubbing, cyanosis and good capillary refill.  Lymphatic: palpation is normal.  Examination of right Upper Extremity is done.  Inspection:   Overall:  Elbow non-tender without crepitus or defects, forearm non-tender without crepitus or defects, wrist non-tender without crepitus or defects, hand non-tender.    Shoulder: with glenohumeral joint tenderness, without effusion.   Upper arm: without swelling and tenderness   Range of motion:   Overall:  Full range of motion of the elbow, full range of motion of wrist and full range of motion in fingers.   Shoulder:  right  165 degrees forward flexion; 145 degrees abduction; 35 degrees internal rotation, 35 degrees external rotation, 15 degrees extension, 40 degrees adduction.   Stability:   Overall:  Shoulder, elbow and wrist stable   Strength and Tone:   Overall full shoulder muscles strength, full upper arm strength and normal upper arm bulk and tone.   The patient has been educated about the nature of the problem(s) and counseled on treatment options.  The patient appeared to understand what I have discussed and is in agreement with it.  Encounter Diagnoses  Name Primary?  . Chronic right shoulder pain Yes  . Chronic left shoulder pain     PLAN Call if any problems.  Precautions discussed.  Continue current medications.   Return to clinic 3 months   I have reviewed the [facility] web site prior to prescribing narcotic medicine for this patient.  Electronically Signed Sanjuana Kava, MD 2/28/201810:07 AM

## 2016-08-19 ENCOUNTER — Other Ambulatory Visit: Payer: Self-pay | Admitting: Orthopaedic Surgery

## 2016-08-20 MED ORDER — HYDROCODONE-ACETAMINOPHEN 7.5-325 MG PO TABS: 1.0000 | ORAL_TABLET | Freq: Four times a day (QID) | ORAL | 0 refills | Status: DC | PRN

## 2016-08-22 ENCOUNTER — Ambulatory Visit: Payer: Medicare HMO | Admitting: Adult Health

## 2016-09-06 ENCOUNTER — Ambulatory Visit (INDEPENDENT_AMBULATORY_CARE_PROVIDER_SITE_OTHER): Payer: Medicare HMO | Admitting: Adult Health

## 2016-09-06 ENCOUNTER — Encounter: Payer: Self-pay | Admitting: Adult Health

## 2016-09-06 ENCOUNTER — Ambulatory Visit (INDEPENDENT_AMBULATORY_CARE_PROVIDER_SITE_OTHER): Payer: Medicare HMO | Admitting: Internal Medicine

## 2016-09-06 DIAGNOSIS — J449 Chronic obstructive pulmonary disease, unspecified: Secondary | ICD-10-CM | POA: Diagnosis not present

## 2016-09-06 LAB — PULMONARY FUNCTION TEST
DL/VA % pred: 94 %
DL/VA: 4.21 ml/min/mmHg/L
DLCO cor % pred: 89 %
DLCO cor: 25.93 ml/min/mmHg
DLCO unc % pred: 93 %
DLCO unc: 27.03 ml/min/mmHg
FEF 25-75 Pre: 0.8 L/sec
FEF2575-%Pred-Pre: 29 %
FEV1-%Pred-Pre: 56 %
FEV1-Pre: 1.85 L
FEV1FVC-%Pred-Pre: 74 %
FEV6-%Pred-Pre: 75 %
FEV6-Pre: 3.15 L
FEV6FVC-%Pred-Pre: 101 %
FVC-%Pred-Pre: 75 %
FVC-Pre: 3.28 L
Pre FEV1/FVC ratio: 57 %
Pre FEV6/FVC Ratio: 96 %

## 2016-09-06 NOTE — Progress Notes (Signed)
PFT done today. 

## 2016-09-06 NOTE — Patient Instructions (Signed)
Continue on Bevespi 2 puff twice daily , rinse after use.  Use albuterol as needed Follow up Dr. Chase Caller in 3-4 months and As needed

## 2016-09-06 NOTE — Progress Notes (Signed)
@Patient  ID: John Bridges., male    DOB: [birth year], 61 y.o.   MRN: [id number]  Chief Complaint  Patient presents with  . Follow-up    Referring provider: Vesta Mixer  HPI: 61 year old male former smoker followed for Gold 2 COPD s/p wedge resection on right upper lobe nodule >granulomatous 09/2015 . (PET +)   TEST  FEV1 2.19 L/64% postbronchodilator in March 2017.   09/06/2016 Follow up COPD  Patient returns for a two-month follow-up. Patient says overall he's been doing okay with his breathing. He was having a COPD flare last visit. He was placed on prednisone taper. And started on BEVESPI .  He is feeling better with decreased cough and shortness of breath... PFT today showed an FEV1 of 56, ratio 57, FVC 75, DLCO 93%. . This is similar to 2017.  He denies any chest pain, orthopnea, PND, or increased leg swelling   No Known Allergies  There is no immunization history for the selected administration types on file for this patient.  Past Medical History:  Diagnosis Date  . COPD (chronic obstructive pulmonary disease) ([place])   . Depression   . Essential hypertension   . History of bronchitis   . Hyperlipidemia   . Lung nodule    Right upper lobe lung nodule 1.3 cm   . Shortness of breath dyspnea     Tobacco History: History  Smoking Status  . Former Smoker  . Packs/day: 1.00  . Years: 40.00  . Types: Cigarettes  . Quit date: 08/16/2015  Smokeless Tobacco  . Former User   Counseling given: Not Answered   Outpatient Encounter Prescriptions as of 09/06/2016  Medication Sig  . amLODipine (NORVASC) 10 MG tablet Take 10 mg by mouth daily.  . Glycopyrrolate-Formoterol (BEVESPI AEROSPHERE) 9-4.8 MCG/ACT AERO Inhale 2 puffs into the lungs 2 (two) times daily.  Marland Kitchen HYDROcodone-acetaminophen (NORCO) 7.5-325 MG tablet Take 1 tablet by mouth every 6 (six) hours as needed for moderate pain (Must last 30 days.Do not drive or operate machinery while taking this  medicine.).  Marland Kitchen lisinopril-hydrochlorothiazide (PRINZIDE,ZESTORETIC) 20-12.5 MG per tablet Take 1 tablet by mouth daily.  . ondansetron (ZOFRAN) 4 MG tablet Take 1 tablet (4 mg total) by mouth as needed for nausea or vomiting.  . pantoprazole (PROTONIX) 40 MG tablet Take 1 tablet (40 mg total) by mouth daily.  . pravastatin (PRAVACHOL) 40 MG tablet Take 40 mg by mouth daily.  . [DISCONTINUED] acetaminophen (TYLENOL) 500 MG tablet Take 2 tablets (1,000 mg total) by mouth every 6 (six) hours as needed for mild pain or fever. (Patient not taking: Reported on 09/06/2016)  . [DISCONTINUED] predniSONE (DELTASONE) 10 MG tablet 40mg  X1 day, 30mg  X1 day, 20mg  X1 day, 10mg  X1 day, 5mg  X1 day. (Patient not taking: Reported on 09/06/2016)   No facility-administered encounter medications on file as of 09/06/2016.      Review of Systems  Constitutional:   No  weight loss, night sweats,  Fevers, chills,  +fatigue, or  lassitude.  HEENT:   No headaches,  Difficulty swallowing,  Tooth/dental problems, or  Sore throat,                No sneezing, itching, ear ache, nasal congestion, post nasal drip,   CV:  No chest pain,  Orthopnea, PND, swelling in lower extremities, anasarca, dizziness, palpitations, syncope.   GI  No heartburn, indigestion, abdominal pain, nausea, vomiting, diarrhea, change in bowel habits, loss of appetite, bloody stools.  Resp:    No chest wall deformity  Skin: no rash or lesions.  GU: no dysuria, change in color of urine, no urgency or frequency.  No flank pain, no hematuria   MS:  No joint pain or swelling.  No decreased range of motion.  No back pain.    Physical Exam  BP (!) 142/76 (BP Location: Left Arm, Cuff Size: Normal)   Pulse 80   SpO2 96%   GEN: A/Ox3; pleasant , NAD, obese    HEENT:  [place]/AT,  EACs-clear, TMs-wnl, NOSE-clear, THROAT-clear, no lesions, no postnasal drip or exudate noted.   NECK:  Supple w/ fair ROM; no JVD; normal carotid impulses w/o bruits; no  thyromegaly or nodules palpated; no lymphadenopathy.    RESP  Clear  P & A; w/o, wheezes/ rales/ or rhonchi. no accessory muscle use, no dullness to percussion  CARD:  RRR, no m/r/g, no peripheral edema, pulses intact, no cyanosis or clubbing.  GI:   Soft & nt; nml bowel sounds; no organomegaly or masses detected.   Musco: Warm bil, no deformities or joint swelling noted.   Neuro: alert, no focal deficits noted.    Skin: Warm, no lesions or rashes    Lab Results:   BNP No results found for: BNP  ProBNP No results found for: PROBNP  Imaging: No results found.   Assessment & Plan:   No problem-specific Assessment & Plan notes found for this encounter.     Rexene Edison, NP 09/06/2016

## 2016-09-08 ENCOUNTER — Encounter (HOSPITAL_COMMUNITY): Payer: Self-pay | Admitting: *Deleted

## 2016-09-08 ENCOUNTER — Emergency Department (HOSPITAL_COMMUNITY)
Admission: EM | Admit: 2016-09-08 | Discharge: 2016-09-09 | Disposition: A | Discharge status: Home / Self Care | Payer: Medicare HMO | Attending: Emergency Medicine | Admitting: Emergency Medicine

## 2016-09-08 DIAGNOSIS — Z79899 Other long term (current) drug therapy: Secondary | ICD-10-CM | POA: Diagnosis not present

## 2016-09-08 DIAGNOSIS — H9212 Otorrhea, left ear: Secondary | ICD-10-CM | POA: Diagnosis present

## 2016-09-08 DIAGNOSIS — I10 Essential (primary) hypertension: Secondary | ICD-10-CM | POA: Diagnosis not present

## 2016-09-08 DIAGNOSIS — I868 Varicose veins of other specified sites: Secondary | ICD-10-CM | POA: Diagnosis not present

## 2016-09-08 DIAGNOSIS — J449 Chronic obstructive pulmonary disease, unspecified: Secondary | ICD-10-CM | POA: Diagnosis not present

## 2016-09-08 DIAGNOSIS — I83899 Varicose veins of unspecified lower extremities with other complications: Secondary | ICD-10-CM

## 2016-09-08 DIAGNOSIS — Z87891 Personal history of nicotine dependence: Secondary | ICD-10-CM | POA: Insufficient documentation

## 2016-09-08 DIAGNOSIS — I8390 Asymptomatic varicose veins of unspecified lower extremity: Secondary | ICD-10-CM

## 2016-09-08 MED ORDER — TRANEXAMIC ACID 1000 MG/10ML IV SOLN
500.0000 mg | Freq: Once | INTRAVENOUS | Status: DC
  Filled 2016-09-08: qty 10

## 2016-09-08 MED ORDER — SILVER NITRATE-POT NITRATE 75-25 % EX MISC
1.0000 | Freq: Once | CUTANEOUS | Status: AC
Start: 2016-09-08 — End: 2016-09-08
  Administered 2016-09-08: 1 via TOPICAL
  Filled 2016-09-08: qty 1

## 2016-09-08 NOTE — ED Provider Notes (Signed)
[facility] DEPT Provider Note   CSN: [id number] Arrival date & time: 09/08/16  2231   By signing my name below, I, John Bridges, attest that this documentation has been prepared under the direction and in the presence of John Porter, MD. Electronically Signed: Hilbert Bridges, Scribe. 09/09/16. 12:08 AM.  Time seen 23:22 PM  Time seen: 11:30 PM History   Chief Complaint Chief Complaint  Patient presents with  . ear bleeding    The history is provided by the patient. No language interpreter was used.  HPI Comments: John Bridges. is a 61 y.o. male who presents to the Emergency Department complaining of profuse bleeding from his left ear since 8:30 pm tonight. He states that he went fishing earlier today and came back to the house when his wife found a tick on each of his ears "in the same place". His wife attempted to remove the tick from his left ear with tweezers when the patient began bleeding at that time. He has tried putting an ice pack and neosporin on his ear with no relief of the bleeding. He has not been able to control the bleeding. He is currently on a baby ASA 81 mg daily, no other blood thinners. He states that it is typically difficult for him to control bleeding if he gets a cut. He denies tobacco use. He drank a six-pack of beer today. He denies any pain currently.  PCP John Fairy, PA-C   Past Medical History:  Diagnosis Date  . COPD (chronic obstructive pulmonary disease) ([place])   . Depression   . Essential hypertension   . History of bronchitis   . Hyperlipidemia   . Lung nodule    Right upper lobe lung nodule 1.3 cm   . Shortness of breath dyspnea     Patient Active Problem List   Diagnosis Date Noted  . Nodule of right lung 09/25/2015  . Lung nodule 08/21/2015  . COPD (chronic obstructive pulmonary disease) ([place]) 08/21/2015  . Need for prophylactic vaccination and inoculation against influenza 07/17/2015  . Smoking greater than 40 pack  years 07/17/2015  . Cancer screening 07/17/2015    Past Surgical History:  Procedure Laterality Date  . COLONOSCOPY    . LEG SURGERY Left   . THORACOTOMY Right 09/25/2015   Procedure: MINI/LIMITED THORACOTOMY WITH RIGHT UPPER LOBE WEDGE RESECTION;  Surgeon: Grace Isaac, MD;  Location: [facility];  Service: Thoracic;  Laterality: Right;  . TOOTH EXTRACTION    . VIDEO BRONCHOSCOPY N/A 09/25/2015   Procedure: VIDEO BRONCHOSCOPY;  Surgeon: Grace Isaac, MD;  Location: [facility] OR;  Service: Thoracic;  Laterality: N/A;       Home Medications    Prior to Admission medications   Medication Sig Start Date End Date Taking? Authorizing Provider  amLODipine (NORVASC) 10 MG tablet Take 10 mg by mouth daily.    Historical Provider, MD  Glycopyrrolate-Formoterol (BEVESPI AEROSPHERE) 9-4.8 MCG/ACT AERO Inhale 2 puffs into the lungs 2 (two) times daily. 06/24/16   Brand Males, MD  HYDROcodone-acetaminophen (NORCO) 7.5-325 MG tablet Take 1 tablet by mouth every 6 (six) hours as needed for moderate pain (Must last 30 days.Do not drive or operate machinery while taking this medicine.). 08/20/16   Sanjuana Kava, MD  lisinopril-hydrochlorothiazide (PRINZIDE,ZESTORETIC) 20-12.5 MG per tablet Take 1 tablet by mouth daily.    Historical Provider, MD  ondansetron (ZOFRAN) 4 MG tablet Take 1 tablet (4 mg total) by mouth as needed for nausea or vomiting. 11/24/15  Brand Males, MD  pantoprazole (PROTONIX) 40 MG tablet Take 1 tablet (40 mg total) by mouth daily. 09/11/13   Milton Ferguson, MD  pravastatin (PRAVACHOL) 40 MG tablet Take 40 mg by mouth daily.    Historical Provider, MD    Family History Family History  Problem Relation Age of Onset  . Hypertension Mother   . Hypertension Father   . Cancer Brother   . CAD Brother   . Stroke Brother     Social History Social History  Substance Use Topics  . Smoking status: Former Smoker    Packs/day: 1.00    Years: 40.00    Types: Cigarettes    Quit  date: 08/16/2015  . Smokeless tobacco: Former Systems developer  . Alcohol use 0.0 oz/week     Comment: 3-4 times a week  lives at home Lives with spouse On disability for rotator cuff problems and LE problems   Allergies   Patient has no known allergies.   Review of Systems Review of Systems  Constitutional: Negative for fever.  HENT: Negative for ear pain.   Gastrointestinal: Negative for diarrhea, nausea and vomiting.  Skin: Positive for wound (left ear).  Neurological: Negative for dizziness.  All other systems reviewed and are negative.    Physical Exam Updated Vital Signs BP (!) 149/82 (BP Location: Right Arm)   Pulse 77   Temp 97.6 F (36.4 C) (Oral)   Resp 20   Ht 5\' 7"  (1.702 m)   Wt 200 lb (90.7 kg)   SpO2 97%   BMI 31.32 kg/m   Vital signs normal    Physical Exam  Constitutional: He is oriented to person, place, and time. He appears well-developed and well-nourished.  Non-toxic appearance. He does not appear ill. No distress.  HENT:  Head: Normocephalic and atraumatic.  Right Ear: External ear normal.  Left Ear: External ear normal.  Ears:  Nose: Nose normal.  On his edge of the left helix about mid-way is an actively bleeding site. He has several areas on both ear lobes which looks like varicosities.  Eyes: Conjunctivae and EOM are normal.  Neck: Normal range of motion and full passive range of motion without pain.  Cardiovascular: Normal rate.   Pulmonary/Chest: Effort normal. No respiratory distress. He has no rhonchi. He exhibits no crepitus.  Abdominal: Normal appearance.  Musculoskeletal: Normal range of motion.  Moves all extremities well.   Neurological: He is alert and oriented to person, place, and time. He has normal strength. No cranial nerve deficit.  Skin: Skin is warm, dry and intact. No rash noted. No erythema. No pallor.  Psychiatric: He has a normal mood and affect. His speech is normal and behavior is normal. His mood appears not anxious.    Nursing note and vitals reviewed.  Right ear (not involved side)     ED Treatments / Results  DIAGNOSTIC STUDIES: Oxygen Saturation is 97% on RA, normal by my interpretation.    Labs (all labs ordered are listed, but only abnormal results are displayed) Labs Reviewed - No data to display  EKG  EKG Interpretation None       Radiology No results found.  Procedures Procedures (including critical care time)  Medications Ordered in ED Medications  silver nitrate applicators applicator 1 application (1 application Topical Given 09/08/16 2331)     Initial Impression / Assessment and Plan / ED Course  I have reviewed the triage vital signs and the nursing notes.  Pertinent labs & imaging results that  were available during my care of the patient were reviewed by me and considered in my medical decision making (see chart for details).    COORDINATION OF CARE: 11:40 PM Discussed treatment plan with pt at bedside, which includes controlling the bleeding and pt agreed to plan.  11:45 PM Silver nitrate stick did not stop the bleeding.   Treatment with Surgicel with pressure for 1 minute stopped the bleeding.  Pt was observed and a dressing applied to his ear. He had no further bleeding. We discussed aftercare.     Final Clinical Impressions(s) / ED Diagnoses   Final diagnoses:  Ruptured varicosity    Plan discharge  John Porter, MD, FACEP   I personally performed the services described in this documentation, which was scribed in my presence. The recorded information has been reviewed and considered.  John Porter, MD, Barbette Or, MD 09/09/16 [phone]

## 2016-09-08 NOTE — ED Notes (Signed)
Secured 2x2 dressing behind left ear, patient tolerated well.

## 2016-09-08 NOTE — ED Triage Notes (Signed)
Pt's wife thought he had a tick on both sides of his ear. Wife took tweezers to remove "tick" and it was a blood blister. Pt c/o continued bleeding x 1 hour.

## 2016-09-09 NOTE — Assessment & Plan Note (Signed)
Improved control with stable lung fxn   Plan  Patient Instructions  Continue on Bevespi 2 puff twice daily , rinse after use.  Use albuterol as needed Follow up Dr. Chase Caller in 3-4 months and As needed

## 2016-09-09 NOTE — Discharge Instructions (Signed)
Keep the dressing on your ear for the next 3-4 days, then remove the outer guaze. Leave the small piece of cloth that is stuck on your ear until it falls off. Return if it starts bleeding again or if you get pain, increased swelling or signs of infection in your ear. Do not get that area wet or use ointment on it or it will come off too soon.

## 2016-09-16 ENCOUNTER — Other Ambulatory Visit: Payer: Self-pay | Admitting: Orthopaedic Surgery

## 2016-09-17 ENCOUNTER — Other Ambulatory Visit: Payer: Self-pay | Admitting: *Deleted

## 2016-09-17 MED ORDER — HYDROCODONE-ACETAMINOPHEN 7.5-325 MG PO TABS: 1.0000 | ORAL_TABLET | Freq: Four times a day (QID) | ORAL | 0 refills | Status: DC | PRN

## 2016-10-07 ENCOUNTER — Other Ambulatory Visit: Payer: Self-pay | Admitting: Cardiothoracic Surgery

## 2016-10-07 DIAGNOSIS — R918 Other nonspecific abnormal finding of lung field: Secondary | ICD-10-CM

## 2016-10-16 ENCOUNTER — Ambulatory Visit (INDEPENDENT_AMBULATORY_CARE_PROVIDER_SITE_OTHER): Payer: Medicare HMO

## 2016-10-16 ENCOUNTER — Encounter: Payer: Self-pay | Admitting: Orthopaedic Surgery

## 2016-10-16 ENCOUNTER — Ambulatory Visit (INDEPENDENT_AMBULATORY_CARE_PROVIDER_SITE_OTHER): Payer: Medicare HMO | Admitting: Orthopaedic Surgery

## 2016-10-16 VITALS — BP 151/80 | HR 86 | Temp 98.1°F | Ht 67.0 in | Wt 196.0 lb

## 2016-10-16 DIAGNOSIS — G8929 Other chronic pain: Secondary | ICD-10-CM | POA: Diagnosis not present

## 2016-10-16 DIAGNOSIS — M25561 Pain in right knee: Secondary | ICD-10-CM | POA: Diagnosis not present

## 2016-10-16 MED ORDER — HYDROCODONE-ACETAMINOPHEN 7.5-325 MG PO TABS: 1.0000 | ORAL_TABLET | Freq: Four times a day (QID) | ORAL | 0 refills | Status: DC | PRN

## 2016-10-16 NOTE — Progress Notes (Signed)
Patient GE:John Bridges., male DOB:[birth year], 61 y.o. UTM:[id number]  Chief Complaint  Patient presents with  . Follow-up    RIGHT SHOULDER PAIN  . Knee Problem    LEFT KNEE LOCKING    HPI  John Bridges. is a 61 y.o. male who has pain of the right knee and has locking at times.  He has swelling and popping.  The locking just started.  He has no trauma, no redness. HPI  Body mass index is 30.7 kg/m.  ROS  Review of Systems  Constitutional:       Patient does not have Diabetes Mellitus. Patient has hypertension. Patient has COPD or shortness of breath. Patient does not have BMI > 35. Patient has current smoking history.  HENT: Negative for congestion.   Respiratory: Positive for shortness of breath. Negative for cough.   Cardiovascular: Negative for chest pain and leg swelling.  Endocrine: Negative for cold intolerance.  Musculoskeletal: Positive for arthralgias and myalgias.  Allergic/Immunologic: Negative for environmental allergies.    Past Medical History:  Diagnosis Date  . COPD (chronic obstructive pulmonary disease) ([place])   . Depression   . Essential hypertension   . History of bronchitis   . Hyperlipidemia   . Lung nodule    Right upper lobe lung nodule 1.3 cm   . Shortness of breath dyspnea     Past Surgical History:  Procedure Laterality Date  . COLONOSCOPY    . LEG SURGERY Left   . THORACOTOMY Right 09/25/2015   Procedure: MINI/LIMITED THORACOTOMY WITH RIGHT UPPER LOBE WEDGE RESECTION;  Surgeon: Grace Isaac, MD;  Location: [facility];  Service: Thoracic;  Laterality: Right;  . TOOTH EXTRACTION    . VIDEO BRONCHOSCOPY N/A 09/25/2015   Procedure: VIDEO BRONCHOSCOPY;  Surgeon: Grace Isaac, MD;  Location: [facility] OR;  Service: Thoracic;  Laterality: N/A;    Family History  Problem Relation Age of Onset  . Hypertension Mother   . Hypertension Father   . Cancer Brother   . CAD Brother   . Stroke Brother     Social History Social History   Substance Use Topics  . Smoking status: Former Smoker    Packs/day: 1.00    Years: 40.00    Types: Cigarettes    Quit date: 08/16/2015  . Smokeless tobacco: Former Systems developer  . Alcohol use 0.0 oz/week     Comment: 3-4 times a week    No Known Allergies  Current Outpatient Prescriptions  Medication Sig Dispense Refill  . amLODipine (NORVASC) 10 MG tablet Take 10 mg by mouth daily.    . Glycopyrrolate-Formoterol (BEVESPI AEROSPHERE) 9-4.8 MCG/ACT AERO Inhale 2 puffs into the lungs 2 (two) times daily. 3 Inhaler 0  . HYDROcodone-acetaminophen (NORCO) 7.5-325 MG tablet Take 1 tablet by mouth every 6 (six) hours as needed for moderate pain (Must last 30 days.Do not drive or operate machinery while taking this medicine.). 100 tablet 0  . lisinopril-hydrochlorothiazide (PRINZIDE,ZESTORETIC) 20-12.5 MG per tablet Take 1 tablet by mouth daily.    . ondansetron (ZOFRAN) 4 MG tablet Take 1 tablet (4 mg total) by mouth as needed for nausea or vomiting. 10 tablet 0  . pantoprazole (PROTONIX) 40 MG tablet Take 1 tablet (40 mg total) by mouth daily. 30 tablet 0  . pravastatin (PRAVACHOL) 40 MG tablet Take 40 mg by mouth daily.     No current facility-administered medications for this visit.      Physical Exam  Blood pressure (!) 151/80, pulse  86, temperature 98.1 F (36.7 C), height 5\' 7"  (1.702 m), weight 196 lb (88.9 kg).  Constitutional: overall normal hygiene, normal nutrition, well developed, normal grooming, normal body habitus. Assistive device:none  Musculoskeletal: gait and station Limp right, muscle tone and strength are normal, no tremors or atrophy is present.  .  Neurological: coordination overall normal.  Deep tendon reflex/nerve stretch intact.  Sensation normal.  Cranial nerves II-XII intact.   Skin:   Normal overall no scars, lesions, ulcers or rashes. No psoriasis.  Psychiatric: Alert and oriented x 3.  Recent memory intact, remote memory unclear.  Normal mood and affect.  Well groomed.  Good eye contact.  Cardiovascular: overall no swelling, no varicosities, no edema bilaterally, normal temperatures of the legs and arms, no clubbing, cyanosis and good capillary refill.  Lymphatic: palpation is normal.  The right lower extremity is examined:  Inspection:  Thigh:  Non-tender and no defects  Knee has swelling 1+ effusion.                        Joint tenderness is present                        Patient is tender over the medial joint line  Lower Leg:  Has normal appearance and no tenderness or defects  Ankle:  Non-tender and no defects  Foot:  Non-tender and no defects Range of Motion:  Knee:  Range of motion is: 0-105                        Crepitus is  present  Ankle:  Range of motion is normal. Strength and Tone:  The right lower extremity has normal strength and tone. Stability:  Knee:  The knee has positive medial McMurray  Ankle:  The ankle is stable.    The patient has been educated about the nature of the problem(s) and counseled on treatment options.  The patient appeared to understand what I have discussed and is in agreement with it.  Encounter Diagnosis  Name Primary?  . Chronic pain of right knee Yes   I am concerned he has a medial meniscus tear with the locking, swelling and positive medial McMurray.  He needs MRI.  He may need surgery.  PLAN Call if any problems.  Precautions discussed.  Continue current medications.   Return to clinic after MRI of the right knee.   I have reviewed the [facility] web site prior to prescribing narcotic medicine for this patient.  Electronically Signed Sanjuana Kava, MD 5/30/201811:05 AM

## 2016-10-21 ENCOUNTER — Telehealth: Payer: Self-pay | Admitting: Orthopaedic Surgery

## 2016-10-21 NOTE — Telephone Encounter (Signed)
Patient called to say that he canceled his MRI due to him not having his $250.00 co-pay.

## 2016-10-23 ENCOUNTER — Ambulatory Visit (HOSPITAL_COMMUNITY): Payer: Medicare HMO

## 2016-10-29 ENCOUNTER — Ambulatory Visit: Payer: Medicare HMO | Admitting: Orthopaedic Surgery

## 2016-11-07 ENCOUNTER — Other Ambulatory Visit: Payer: Medicare HMO

## 2016-11-07 ENCOUNTER — Ambulatory Visit: Payer: Medicare HMO | Admitting: Cardiothoracic Surgery

## 2016-11-12 ENCOUNTER — Telehealth: Payer: Self-pay | Admitting: Orthopaedic Surgery

## 2016-11-12 MED ORDER — HYDROCODONE-ACETAMINOPHEN 7.5-325 MG PO TABS: 1.0000 | ORAL_TABLET | Freq: Four times a day (QID) | ORAL | 0 refills | Status: DC | PRN

## 2016-12-11 ENCOUNTER — Telehealth: Payer: Self-pay | Admitting: Orthopaedic Surgery

## 2016-12-12 MED ORDER — HYDROCODONE-ACETAMINOPHEN 7.5-325 MG PO TABS: 1.0000 | ORAL_TABLET | Freq: Four times a day (QID) | ORAL | 0 refills | Status: DC | PRN

## 2017-01-06 ENCOUNTER — Encounter: Payer: Self-pay | Admitting: Internal Medicine

## 2017-01-06 ENCOUNTER — Ambulatory Visit (INDEPENDENT_AMBULATORY_CARE_PROVIDER_SITE_OTHER): Payer: Medicare HMO | Admitting: Internal Medicine

## 2017-01-06 VITALS — BP 138/76 | HR 81 | Ht 67.0 in | Wt 194.0 lb

## 2017-01-06 DIAGNOSIS — J449 Chronic obstructive pulmonary disease, unspecified: Secondary | ICD-10-CM

## 2017-01-06 DIAGNOSIS — Z122 Encounter for screening for malignant neoplasm of respiratory organs: Secondary | ICD-10-CM | POA: Diagnosis not present

## 2017-01-06 NOTE — Progress Notes (Signed)
Subjective:     Patient ID: John Bridges., male   DOB: 1955-10-30, 61 y.o.   MRN: [id number]  HPI  61 year old male former smoker followed for Gold 2 COPD s/p wedge resection on right upper lobe nodule >granulomatous 09/2015 . (PET +)   TEST  FEV1 2.19 L/64% postbronchodilator in March 2017.   09/06/2016 Follow up COPD  Patient returns for a two-month follow-up. Patient says overall he's been doing okay with his breathing. He was having a COPD flare last visit. He was placed on prednisone taper. And started on BEVESPI .  He is feeling better with decreased cough and shortness of breath... PFT today showed an FEV1 of 56, ratio 57, FVC 75, DLCO 93%. . This is similar to 2017.  He denies any chest pain, orthopnea, PND, or increased leg swelling    OV 01/06/2017  Chief Complaint  Patient presents with  . Follow-up    Pt states his breathing is unchanged since last OV. Pt states he has a cough in the mornings. Pt denies CP/tightness, and f/c/s.     Follow-up Gold stage II COPD: Overall is doing well. He is on combination long-acting anticholinergic and long-acting beta agonist but he does not take this. He only takes his as needed. He does not have any cough. He only has baseline shortness of breath class II-III. He does not feel he needs his inhalers on a regular basis. His despite extensive counseling there is no associated chest pain orthopnea paroxysmal nocturnal dyspnea or edema. It is noted he is on ACE inhibitor's when he does not want to stop this  Follow-up lung cancer screening: He ended up having a nodule that was resected ended up being benign. His last CT scan of the chest was in December 2017.    has a past medical history of COPD (chronic obstructive pulmonary disease) ([place]); Depression; Essential hypertension; History of bronchitis; Hyperlipidemia; Lung nodule; and Shortness of breath dyspnea.   reports that he quit smoking about 16 months ago. His smoking use included  Cigarettes. He has a 40.00 pack-year smoking history. He has quit using smokeless tobacco.  Past Surgical History:  Procedure Laterality Date  . COLONOSCOPY    . LEG SURGERY Left   . THORACOTOMY Right 09/25/2015   Procedure: MINI/LIMITED THORACOTOMY WITH RIGHT UPPER LOBE WEDGE RESECTION;  Surgeon: Grace Isaac, MD;  Location: [facility];  Service: Thoracic;  Laterality: Right;  . TOOTH EXTRACTION    . VIDEO BRONCHOSCOPY N/A 09/25/2015   Procedure: VIDEO BRONCHOSCOPY;  Surgeon: Grace Isaac, MD;  Location: [facility] OR;  Service: Thoracic;  Laterality: N/A;    No Known Allergies  There is no immunization history for the selected administration types on file for this patient.  Family History  Problem Relation Age of Onset  . Hypertension Mother   . Hypertension Father   . Cancer Brother   . CAD Brother   . Stroke Brother      Current Outpatient Prescriptions:  .  amLODipine (NORVASC) 10 MG tablet, Take 10 mg by mouth daily., Disp: , Rfl:  .  aspirin EC 81 MG tablet, Take 81 mg by mouth daily., Disp: , Rfl:  .  Glycopyrrolate-Formoterol (BEVESPI AEROSPHERE) 9-4.8 MCG/ACT AERO, Inhale 2 puffs into the lungs 2 (two) times daily., Disp: 3 Inhaler, Rfl: 0 .  HYDROcodone-acetaminophen (NORCO) 7.5-325 MG tablet, Take 1 tablet by mouth every 6 (six) hours as needed for moderate pain (Must last 30 days.Do not drive or operate  machinery while taking this medicine.)., Disp: 100 tablet, Rfl: 0 .  lisinopril-hydrochlorothiazide (PRINZIDE,ZESTORETIC) 20-12.5 MG per tablet, Take 1 tablet by mouth daily., Disp: , Rfl:  .  ondansetron (ZOFRAN) 4 MG tablet, Take 1 tablet (4 mg total) by mouth as needed for nausea or vomiting., Disp: 10 tablet, Rfl: 0 .  pantoprazole (PROTONIX) 40 MG tablet, Take 1 tablet (40 mg total) by mouth daily., Disp: 30 tablet, Rfl: 0 .  pravastatin (PRAVACHOL) 40 MG tablet, Take 40 mg by mouth daily., Disp: , Rfl:     Review of Systems     Objective:   Physical Exam   Constitutional: He is oriented to person, place, and time. He appears well-developed and well-nourished. No distress.  HENT:  Head: Normocephalic and atraumatic.  Right Ear: External ear normal.  Left Ear: External ear normal.  Mouth/Throat: Oropharynx is clear and moist. No oropharyngeal exudate.  Eyes: Pupils are equal, round, and reactive to light. Conjunctivae and EOM are normal. Right eye exhibits no discharge. Left eye exhibits no discharge. No scleral icterus.  Neck: Normal range of motion. Neck supple. No JVD present. No tracheal deviation present. No thyromegaly present.  Cardiovascular: Normal rate, regular rhythm and intact distal pulses.  Exam reveals no gallop and no friction rub.   No murmur heard. Pulmonary/Chest: Effort normal and breath sounds normal. No respiratory distress. He has no wheezes. He has no rales. He exhibits no tenderness.  Abdominal: Soft. Bowel sounds are normal. He exhibits no distension and no mass. There is no tenderness. There is no rebound and no guarding.  Visceral obesity + Small 1cm bruise in RUQ area  Musculoskeletal: Normal range of motion. He exhibits no edema or tenderness.  Lymphadenopathy:    He has no cervical adenopathy.  Neurological: He is alert and oriented to person, place, and time. He has normal reflexes. No cranial nerve deficit. Coordination normal.  Skin: Skin is warm and dry. No rash noted. He is not diaphoretic. No erythema. No pallor.  Psychiatric: He has a normal Bridges and affect. His behavior is normal. Judgment and thought content normal.  Nursing note and vitals reviewed.   Vitals:   01/06/17 1201  BP: 138/76  Pulse: 81  SpO2: 97%  Weight: 194 lb (88 kg)  Height: 5\' 7"  (1.702 m)    Estimated body mass index is 30.38 kg/m as calculated from the following:   Height as of this encounter: 5\' 7"  (1.702 m).   Weight as of this encounter: 194 lb (88 kg).      Assessment:       ICD-10-CM   1. Stage 2 moderate COPD by  GOLD classification ([place]) J44.9   2. Encounter for screening for lung cancer Z12.2        Plan:      Stage 2 moderate COPD by GOLD classification ([place]) - stable disease - use bevespi as needed  - flu shot in fall -= but respect deciosion to decline  - Please talk to PCP Baucom, Lennon Alstrom, PA-C -  and ensure you get  shingarix vaccine - as long as no cough ok to continue lisinopril  Encounter for screening for lung cancer  - will address at [place]  Followup  72months or sooner if needed   Dr. Brand Males, M.D., [facility].C.P Pulmonary and Critical Care Medicine Staff Physician [facility] [facility] Pager: [phone], If no answer or between  15:00h - 7:00h: call [phone]  [phone]  01/06/2017 12:37 PM

## 2017-01-06 NOTE — Patient Instructions (Signed)
ICD-10-CM   1. Stage 2 moderate COPD by GOLD classification ([place]) J44.9   2. Encounter for screening for lung cancer Z12.2    Stage 2 moderate COPD by GOLD classification ([place]) - stable disease - use bevespi as needed  - flu shot in fall -= but respect deciosion to decline  - Please talk to PCP Baucom, Lennon Alstrom, PA-C -  and ensure you get  shingarix vaccine - as long as no cough ok to continue lisinopril  Encounter for screening for lung cancer  - will address at [place]  Followup  36months or sooner if needed

## 2017-01-10 ENCOUNTER — Telehealth: Payer: Self-pay | Admitting: Orthopaedic Surgery

## 2017-01-13 MED ORDER — HYDROCODONE-ACETAMINOPHEN 7.5-325 MG PO TABS: 1.0000 | ORAL_TABLET | Freq: Four times a day (QID) | ORAL | 0 refills | Status: DC | PRN

## 2017-01-23 ENCOUNTER — Telehealth: Payer: Self-pay | Admitting: Orthopaedic Surgery

## 2017-01-23 NOTE — Telephone Encounter (Signed)
I called and left a message for the patient to call me if he has any questions or call the front desk and make an apt.  I explained that we will need to see him again to document his status in order to get a new approval for the Mri.

## 2017-01-23 NOTE — Telephone Encounter (Signed)
Patient/spouse, Darlene called (designated party contact form on file) - relays patient would like to re-schedule the MRI he had cancelled for reason of not having the $250.00 copay. Insurance is [employer] I relayed that there it appears that insurance [facility]) would need to re-authorize, as notes indicate expiration 11/15/16. States patient would also like to know if "the other MRI that another doctor had ordered for his lung" can possibly be done at same time, under 1 copay. I relayed that this is not typically done, for several reasons.  Please call patient to further advise. Ph#'s [phone] [facility]) and [phone] (H)

## 2017-01-23 NOTE — Telephone Encounter (Signed)
Patient/spouse called

## 2017-01-27 NOTE — Telephone Encounter (Signed)
Patient returned call (same day as original note) and has been scheduled for appointment.  Aware.

## 2017-01-28 ENCOUNTER — Ambulatory Visit (INDEPENDENT_AMBULATORY_CARE_PROVIDER_SITE_OTHER): Payer: Medicare HMO | Admitting: Orthopaedic Surgery

## 2017-01-28 VITALS — BP 125/76 | HR 84 | Temp 97.4°F | Ht 67.0 in | Wt 194.0 lb

## 2017-01-28 DIAGNOSIS — G8929 Other chronic pain: Secondary | ICD-10-CM

## 2017-01-28 DIAGNOSIS — M25561 Pain in right knee: Secondary | ICD-10-CM | POA: Diagnosis not present

## 2017-01-28 NOTE — Progress Notes (Signed)
Patient John Bridges L [name], male DOB:1955/09/15, 61 y.o. ALP:[id number]  Chief Complaint  Patient presents with  . Follow-up    Right knee    HPI  John GRIMA Sr. is a 61 y.o. male who has continued pain of the right knee with giving way, swelling and popping.  He was scheduled for MRI this summer but was unable to do it. He would like to get the MRI now as he is not improved and in fact, worse.  He has no new trauma.  He limps.  Nothing seems to help it. HPI  Body mass index is 30.38 kg/m.  ROS  Review of Systems  Constitutional:       Patient does not have Diabetes Mellitus. Patient has hypertension. Patient has COPD or shortness of breath. Patient does not have BMI > 35. Patient has current smoking history.  HENT: Negative for congestion.   Respiratory: Positive for shortness of breath. Negative for cough.   Cardiovascular: Negative for chest pain and leg swelling.  Endocrine: Negative for cold intolerance.  Musculoskeletal: Positive for arthralgias and myalgias.  Allergic/Immunologic: Negative for environmental allergies.    Past Medical History:  Diagnosis Date  . COPD (chronic obstructive pulmonary disease) ([place])   . Depression   . Essential hypertension   . History of bronchitis   . Hyperlipidemia   . Lung nodule    Right upper lobe lung nodule 1.3 cm   . Shortness of breath dyspnea     Past Surgical History:  Procedure Laterality Date  . COLONOSCOPY    . LEG SURGERY Left   . THORACOTOMY Right 09/25/2015   Procedure: MINI/LIMITED THORACOTOMY WITH RIGHT UPPER LOBE WEDGE RESECTION;  Surgeon: Grace Isaac, MD;  Location: [place];  Service: Thoracic;  Laterality: Right;  . TOOTH EXTRACTION    . VIDEO BRONCHOSCOPY N/A 09/25/2015   Procedure: VIDEO BRONCHOSCOPY;  Surgeon: Grace Isaac, MD;  Location: [facility] OR;  Service: Thoracic;  Laterality: N/A;    Family History  Problem Relation Age of Onset  . Hypertension Mother   . Hypertension Father   . Cancer  Brother   . CAD Brother   . Stroke Brother     Social History Social History  Substance Use Topics  . Smoking status: Former Smoker    Packs/day: 1.00    Years: 40.00    Types: Cigarettes    Quit date: 08/16/2015  . Smokeless tobacco: Former Systems developer  . Alcohol use 0.0 oz/week     Comment: 3-4 times a week    No Known Allergies  Current Outpatient Prescriptions  Medication Sig Dispense Refill  . amLODipine (NORVASC) 10 MG tablet Take 10 mg by mouth daily.    Marland Kitchen aspirin EC 81 MG tablet Take 81 mg by mouth daily.    . Glycopyrrolate-Formoterol (BEVESPI AEROSPHERE) 9-4.8 MCG/ACT AERO Inhale 2 puffs into the lungs 2 (two) times daily. 3 Inhaler 0  . HYDROcodone-acetaminophen (NORCO) 7.5-325 MG tablet Take 1 tablet by mouth every 6 (six) hours as needed for moderate pain (Must last 30 days.Do not drive or operate machinery while taking this medicine.). 100 tablet 0  . lisinopril-hydrochlorothiazide (PRINZIDE,ZESTORETIC) 20-12.5 MG per tablet Take 1 tablet by mouth daily.    . ondansetron (ZOFRAN) 4 MG tablet Take 1 tablet (4 mg total) by mouth as needed for nausea or vomiting. 10 tablet 0  . pantoprazole (PROTONIX) 40 MG tablet Take 1 tablet (40 mg total) by mouth daily. 30 tablet 0  . pravastatin (  PRAVACHOL) 40 MG tablet Take 40 mg by mouth daily.     No current facility-administered medications for this visit.      Physical Exam  Blood pressure 125/76, pulse 84, temperature (!) 97.4 F (36.3 C), height 5\' 7"  (1.702 m), weight 194 lb (88 kg).  Constitutional: overall normal hygiene, normal nutrition, well developed, normal grooming, normal body habitus. Assistive device:none  Musculoskeletal: gait and station Limp right, muscle tone and strength are normal, no tremors or atrophy is present.  .  Neurological: coordination overall normal.  Deep tendon reflex/nerve stretch intact.  Sensation normal.  Cranial nerves II-XII intact.   Skin:   Normal overall no scars, lesions, ulcers  or rashes. No psoriasis.  Psychiatric: Alert and oriented x 3.  Recent memory intact, remote memory unclear.  Normal mood and affect. Well groomed.  Good eye contact.  Cardiovascular: overall no swelling, no varicosities, no edema bilaterally, normal temperatures of the legs and arms, no clubbing, cyanosis and good capillary refill.  Lymphatic: palpation is normal.  The right lower extremity is examined:  Inspection:  Thigh:  Non-tender and no defects  Knee has swelling 1+ effusion.                        Joint tenderness is present                        Patient is tender over the medial joint line  Lower Leg:  Has normal appearance and no tenderness or defects  Ankle:  Non-tender and no defects  Foot:  Non-tender and no defects Range of Motion:  Knee:  Range of motion is: 0-105                        Crepitus is  present  Ankle:  Range of motion is normal. Strength and Tone:  The right lower extremity has normal strength and tone. Stability:  Knee:  The knee is positive medial McMurray  Ankle:  The ankle is stable.    The patient has been educated about the nature of the problem(s) and counseled on treatment options.  The patient appeared to understand what I have discussed and is in agreement with it.  Encounter Diagnosis  Name Primary?  . Chronic pain of right knee Yes    PLAN Call if any problems.  Precautions discussed.  Continue current medications.   Return to clinic get MRI of the knee on the right.   Electronically Signed Sanjuana Kava, MD 9/11/201810:21 AM

## 2017-02-05 ENCOUNTER — Ambulatory Visit (HOSPITAL_COMMUNITY)
Admission: RE | Admit: 2017-02-05 | Discharge: 2017-02-05 | Disposition: A | Discharge status: Home / Self Care | Payer: Medicare HMO | Source: Ambulatory Visit | Attending: Orthopaedic Surgery | Admitting: Orthopaedic Surgery

## 2017-02-05 DIAGNOSIS — M25561 Pain in right knee: Secondary | ICD-10-CM

## 2017-02-05 DIAGNOSIS — M23221 Derangement of posterior horn of medial meniscus due to old tear or injury, right knee: Secondary | ICD-10-CM | POA: Diagnosis not present

## 2017-02-05 DIAGNOSIS — G8929 Other chronic pain: Secondary | ICD-10-CM | POA: Diagnosis present

## 2017-02-05 DIAGNOSIS — M25461 Effusion, right knee: Secondary | ICD-10-CM | POA: Insufficient documentation

## 2017-02-05 DIAGNOSIS — M2410 Other articular cartilage disorders, unspecified site: Secondary | ICD-10-CM | POA: Diagnosis not present

## 2017-02-09 ENCOUNTER — Other Ambulatory Visit: Payer: Self-pay | Admitting: Orthopaedic Surgery

## 2017-02-11 ENCOUNTER — Ambulatory Visit: Payer: Medicare HMO | Admitting: Orthopaedic Surgery

## 2017-02-11 ENCOUNTER — Telehealth: Payer: Self-pay | Admitting: *Deleted

## 2017-02-11 MED ORDER — HYDROCODONE-ACETAMINOPHEN 7.5-325 MG PO TABS: 1.0000 | ORAL_TABLET | Freq: Four times a day (QID) | ORAL | 0 refills | Status: DC | PRN

## 2017-02-11 NOTE — Telephone Encounter (Signed)
Patient called requesting his hydrocodone to be refilled.  

## 2017-02-20 ENCOUNTER — Ambulatory Visit (INDEPENDENT_AMBULATORY_CARE_PROVIDER_SITE_OTHER): Payer: Medicare HMO | Admitting: Orthopaedic Surgery

## 2017-02-20 ENCOUNTER — Encounter: Payer: Self-pay | Admitting: Orthopaedic Surgery

## 2017-02-20 VITALS — BP 136/73 | HR 82 | Temp 98.2°F | Ht 67.0 in | Wt 198.0 lb

## 2017-02-20 DIAGNOSIS — G8929 Other chronic pain: Secondary | ICD-10-CM

## 2017-02-20 DIAGNOSIS — M25561 Pain in right knee: Secondary | ICD-10-CM | POA: Diagnosis not present

## 2017-02-20 NOTE — Progress Notes (Signed)
Patient John L [name], male DOB:[birth year], 61 y.o. WCH:[id number]  Chief Complaint  Patient presents with  . Knee Pain    right    HPI  John LANGILLE Sr. is a 61 y.o. male who has right knee pain.  He had MRI of the knee showing: IMPRESSION: 1. Complex tear posterior horn medial meniscus. 2. ACL sprain versus degeneration. 3. Mildly thickened MCL with surrounding edema, suspicious for grade 2 sprain. 4. 2.5 by 1.1 cm subcortical stress fracture or osteochondral lesion of the medial femoral condyle with considerable surrounding marrow edema. 5. Chondral fissuring with focal chondral delamination along the lateral patellar facet. Partial thickness focal chondral defect along the medial patellar facet. 6. Moderate to prominent degenerative chondral thinning in the medial compartment. Mild chondral thinning in the lateral compartment. 7. Large knee joint effusion with small to moderate size Baker's Cyst.  I will have Dr. Aline Brochure see him for possible arthroscopy of the right knee.  Patient is agreeable to this. HPI  Body mass index is 31.01 kg/m.  ROS  Review of Systems  Constitutional:       Patient does not have Diabetes Mellitus. Patient has hypertension. Patient has COPD or shortness of breath. Patient does not have BMI > 35. Patient has current smoking history.  HENT: Negative for congestion.   Respiratory: Positive for shortness of breath. Negative for cough.   Cardiovascular: Negative for chest pain and leg swelling.  Endocrine: Negative for cold intolerance.  Musculoskeletal: Positive for arthralgias and myalgias.  Allergic/Immunologic: Negative for environmental allergies.    Past Medical History:  Diagnosis Date  . COPD (chronic obstructive pulmonary disease) ([place])   . Depression   . Essential hypertension   . History of bronchitis   . Hyperlipidemia   . Lung nodule    Right upper lobe lung nodule 1.3 cm   . Shortness of breath dyspnea      Past Surgical History:  Procedure Laterality Date  . COLONOSCOPY    . LEG SURGERY Left   . THORACOTOMY Right 09/25/2015   Procedure: MINI/LIMITED THORACOTOMY WITH RIGHT UPPER LOBE WEDGE RESECTION;  Surgeon: Grace Isaac, MD;  Location: [place];  Service: Thoracic;  Laterality: Right;  . TOOTH EXTRACTION    . VIDEO BRONCHOSCOPY N/A 09/25/2015   Procedure: VIDEO BRONCHOSCOPY;  Surgeon: Grace Isaac, MD;  Location: [facility] OR;  Service: Thoracic;  Laterality: N/A;    Family History  Problem Relation Age of Onset  . Hypertension Mother   . Hypertension Father   . Cancer Brother   . CAD Brother   . Stroke Brother     Social History Social History  Substance Use Topics  . Smoking status: Former Smoker    Packs/day: 1.00    Years: 40.00    Types: Cigarettes    Quit date: 08/16/2015  . Smokeless tobacco: Former Systems developer  . Alcohol use 0.0 oz/week     Comment: 3-4 times a week    No Known Allergies  Current Outpatient Prescriptions  Medication Sig Dispense Refill  . amLODipine (NORVASC) 10 MG tablet Take 10 mg by mouth daily.    Marland Kitchen aspirin EC 81 MG tablet Take 81 mg by mouth daily.    . Glycopyrrolate-Formoterol (BEVESPI AEROSPHERE) 9-4.8 MCG/ACT AERO Inhale 2 puffs into the lungs 2 (two) times daily. 3 Inhaler 0  . HYDROcodone-acetaminophen (NORCO) 7.5-325 MG tablet Take 1 tablet by mouth every 6 (six) hours as needed for moderate pain (Must last 30 days.Do not drive  or operate machinery while taking this medicine.). 100 tablet 0  . lisinopril-hydrochlorothiazide (PRINZIDE,ZESTORETIC) 20-12.5 MG per tablet Take 1 tablet by mouth daily.    . ondansetron (ZOFRAN) 4 MG tablet Take 1 tablet (4 mg total) by mouth as needed for nausea or vomiting. 10 tablet 0  . pantoprazole (PROTONIX) 40 MG tablet Take 1 tablet (40 mg total) by mouth daily. 30 tablet 0  . pravastatin (PRAVACHOL) 40 MG tablet Take 40 mg by mouth daily.     No current facility-administered medications for this visit.       Physical Exam  Blood pressure 136/73, pulse 82, temperature 98.2 F (36.8 C), height 5\' 7"  (1.702 m), weight 198 lb (89.8 kg).  Constitutional: overall normal hygiene, normal nutrition, well developed, normal grooming, normal body habitus. Assistive device:none  Musculoskeletal: gait and station Limp right, muscle tone and strength are normal, no tremors or atrophy is present.  .  Neurological: coordination overall normal.  Deep tendon reflex/nerve stretch intact.  Sensation normal.  Cranial nerves II-XII intact.   Skin:   Normal overall no scars, lesions, ulcers or rashes. No psoriasis.  Psychiatric: Alert and oriented x 3.  Recent memory intact, remote memory unclear.  Normal mood and affect. Well groomed.  Good eye contact.  Cardiovascular: overall no swelling, no varicosities, no edema bilaterally, normal temperatures of the legs and arms, no clubbing, cyanosis and good capillary refill.  Lymphatic: palpation is normal.  All other systems reviewed and are negative   He has right knee pain.  ROM is 0 to 110 with medial joint line pain and tenderness. He has positive Medial McMurray sign, limp to the right.  NV is intact.  The patient has been educated about the nature of the problem(s) and counseled on treatment options.  The patient appeared to understand what I have discussed and is in agreement with it.  Encounter Diagnosis  Name Primary?  . Chronic pain of right knee Yes    PLAN Call if any problems.  Precautions discussed.  Continue current medications.   Return to clinic to see Dr. Aline Brochure for possible surgery right knee.   Electronically Signed Sanjuana Kava, MD 10/4/201810:44 AM

## 2017-03-10 ENCOUNTER — Telehealth: Payer: Self-pay | Admitting: Orthopaedic Surgery

## 2017-03-10 MED ORDER — HYDROCODONE-ACETAMINOPHEN 7.5-325 MG PO TABS: 1.0000 | ORAL_TABLET | Freq: Four times a day (QID) | ORAL | 0 refills | Status: DC | PRN

## 2017-03-19 ENCOUNTER — Encounter: Payer: Self-pay | Admitting: Orthopedic Surgery

## 2017-03-19 ENCOUNTER — Ambulatory Visit (INDEPENDENT_AMBULATORY_CARE_PROVIDER_SITE_OTHER): Payer: Medicare HMO | Admitting: Orthopedic Surgery

## 2017-03-19 VITALS — BP 113/63 | HR 79 | Ht 67.0 in | Wt 199.0 lb

## 2017-03-19 DIAGNOSIS — M23321 Other meniscus derangements, posterior horn of medial meniscus, right knee: Secondary | ICD-10-CM | POA: Diagnosis not present

## 2017-03-19 DIAGNOSIS — M84351A Stress fracture, right femur, initial encounter for fracture: Secondary | ICD-10-CM

## 2017-03-19 DIAGNOSIS — M1711 Unilateral primary osteoarthritis, right knee: Secondary | ICD-10-CM

## 2017-03-19 NOTE — Patient Instructions (Signed)
Meniscus Injury, Arthroscopy Arthroscopy is a surgical procedure that involves the use of a small scope that has a camera and surgical instruments on the end (arthroscope). An arthroscope can be used to repair your meniscus injury.  LET YOUR HEALTH CARE PROVIDER KNOW ABOUT:  Any allergies you have.  All medicines you are taking, including vitamins, herbs, eyedrops, creams, and over-the-counter medicines.  Any recent colds or infections you have had or currently have.  Previous problems you or members of your family have had with the use of anesthetics.  Any blood disorders or blood clotting problems you have.  Previous surgeries you have had.  Medical conditions you have. RISKS AND COMPLICATIONS Generally, this is a safe procedure. However, as with any procedure, problems can occur. Possible problems include:  Damage to nerves or blood vessels.  Excess bleeding.  Blood clots.  Infection. BEFORE THE PROCEDURE  Do not eat or drink for 6-8 hours before the procedure.  Take medicines as directed by your surgeon. Ask your surgeon about changing or stopping your regular medicines.  You may have lab tests the morning of surgery. PROCEDURE  You will be given one of the following:   A medicine that numbs the area (local anesthesia).  A medicine that makes you go to sleep (general anesthesia).  A medicine injected into your spine that numbs your body below the waist (spinal anesthesia). Most often, several small cuts (incisions) are made in the knee. The arthroscope and instruments go into the incisions to repair the damage. The torn portion of the meniscus is removed.   AFTER THE PROCEDURE  You will be taken to the recovery area where your progress will be monitored. When you are awake, stable, and taking fluids without complications, you will be allowed to go home. This is usually the same day. A torn or stretched ligament (ligament sprain) may take 6-8 weeks to heal.   It  takes about the 4-6 WEEKS if your surgeon removed a torn meniscus.  A repaired meniscus may require 6-12 weeks of recovery time.  A torn ligament needing reconstructive surgery may take 6-12 months to heal fully.   This information is not intended to replace advice given to you by your health care provider. Make sure you discuss any questions you have with your health care provider. You have decided to proceed with operative arthroscopy of the knee. You have decided not to continue with nonoperative measures such as but not limited to oral medication, weight loss, activity modification, physical therapy, bracing, or injection.  We will perform operative arthroscopy of the knee. Some of the risks associated with arthroscopic surgery of the knee include but are not limited to Bleeding Infection Swelling Stiffness Blood clot Pain  If you're not comfortable with these risks and would like to continue with nonoperative treatment please let Dr. Gardner Servantes know prior to your surgery.   Document Released: 05/03/2000 Document Revised: 05/11/2013 Document Reviewed: 10/02/2012 Elsevier Interactive Patient Education 2016 [employer] You have decided to proceed with operative arthroscopy of the knee. You have decided not to continue with nonoperative measures such as but not limited to oral medication, weight loss, activity modification, physical therapy, bracing, or injection.  We will perform operative arthroscopy of the knee. Some of the risks associated with arthroscopic surgery of the knee include but are not limited to Bleeding Infection Swelling Stiffness Blood clot Pain  If you're not comfortable with these risks and would like to continue with nonoperative treatment please let Dr. Nhat Hearne   know prior to your surgery. 

## 2017-03-19 NOTE — Progress Notes (Signed)
Progress Note   Patient ID: John Bridges., male   DOB: 08/03/1955, 61 y.o.   MRN: [id number]  Chief Complaint  Patient presents with  . Knee Pain    right surgical consult    61 year old male presents for preop evaluation for arthroscopy right knee  The patient has a 4 month history of pain in his right knee unrelieved by cortisone injection. He eventually had MRI right knee which showed torn medial meniscus osteoarthritis stress fracture medial femoral condyle.  He complains of medial and lateral joint line pain stiffness after sitting.  The pain is constant dull worsen with activity relieved with rest and is severe enough to cause pain despite being on hydrocodone.  He takes hydrocodone for his shoulder     Review of Systems  Constitutional: Negative for fever.  Cardiovascular: Negative for chest pain.  Neurological: Negative for tingling, tremors and sensory change.  Endo/Heme/Allergies: Negative for environmental allergies and polydipsia. Does not bruise/bleed easily.   Current Meds  Medication Sig  . ALPRAZolam (XANAX) 0.5 MG tablet   . amLODipine (NORVASC) 10 MG tablet Take 10 mg by mouth daily.  Marland Kitchen aspirin EC 81 MG tablet Take 81 mg by mouth daily.  . Glycopyrrolate-Formoterol (BEVESPI AEROSPHERE) 9-4.8 MCG/ACT AERO Inhale 2 puffs into the lungs 2 (two) times daily.  Marland Kitchen HYDROcodone-acetaminophen (NORCO) 7.5-325 MG tablet Take 1 tablet by mouth every 6 (six) hours as needed for moderate pain (Must last 30 days.Do not drive or operate machinery while taking this medicine.).  Marland Kitchen lisinopril-hydrochlorothiazide (PRINZIDE,ZESTORETIC) 20-12.5 MG per tablet Take 1 tablet by mouth daily.  . ondansetron (ZOFRAN) 4 MG tablet Take 1 tablet (4 mg total) by mouth as needed for nausea or vomiting.  . pantoprazole (PROTONIX) 40 MG tablet Take 1 tablet (40 mg total) by mouth daily.  . pravastatin (PRAVACHOL) 40 MG tablet Take 40 mg by mouth daily.    Past Medical History:  Diagnosis  Date  . COPD (chronic obstructive pulmonary disease) ([place])   . Depression   . Essential hypertension   . History of bronchitis   . Hyperlipidemia   . Lung nodule    Right upper lobe lung nodule 1.3 cm   . Shortness of breath dyspnea      No Known Allergies  BP 113/63   Pulse 79   Ht 5\' 7"  (1.702 m)   Wt 199 lb (90.3 kg)   BMI 31.17 kg/m    Physical Exam Gen. appearance the patient's appearance is normal with normal grooming and  hygiene The patient is oriented to person place and time Mood and affect are normal   Left Knee Exam   Muscle Strength  Normal left knee strength  Right Knee Exam   Muscle Strength  Normal right knee strength     Gait normal no limp   Physical Exam   Right Knee Exam   Tenderness  The patient is experiencing tenderness in the lateral joint line and medial joint line.  Range of Motion  Extension: normal  Flexion:  120 normal   Muscle Strength   The patient has normal right knee strength.  Tests  McMurray:  Medial - positive Lateral - negative Drawer:       Anterior - negative    Posterior - negative Varus: negative Valgus: negative  Other  Erythema: absent Scars: absent Sensation: normal Pulse: present Swelling: none   Left Knee Exam   Tenderness  The patient is experiencing no tenderness.  Range of Motion  Extension: normal  Flexion:  120 normal   Muscle Strength   The patient has normal left knee strength.  Tests  McMurray:  Medial - negative Lateral - negative Drawer:       Anterior - negative     Posterior - negative Varus: negative Valgus: negative  Other  Erythema: absent Scars: absent Sensation: normal Pulse: present Swelling: none      Medical decision-making  Imaging x-ray show symmetric joint space narrowing no evidence of secondary bone changes from arthritis  MRI shows torn medial meniscus arthritis of stay 3 compartments degeneration of the cruciate and complex medial  meniscus tear  Those are independent interpretation [employer]  Full reports are reviewed as well and are in the record referred to by reference  This is a new problem to me it's established it's not improved  We recommend surgery   Encounter Diagnoses  Name Primary?  . Derangement of posterior horn of medial meniscus of right knee Yes  . Primary osteoarthritis of right knee   . Stress fracture of right femur, initial encounter       Meds ordered this encounter  Medications  . ALPRAZolam (XANAX) 0.5 MG tablet   Arthroscopy right knee partial medial meniscectomy - 29881  The procedure has been fully reviewed with the patient; The risks and benefits of surgery have been discussed and explained and understood. Alternative treatment has also been reviewed, questions were encouraged and answered. The postoperative plan is also been reviewed.  Arther Abbott, MD 03/19/2017 1:52 PM

## 2017-03-31 ENCOUNTER — Telehealth: Payer: Self-pay | Admitting: Orthopedic Surgery

## 2017-03-31 NOTE — Telephone Encounter (Signed)
He is in the book to have surgery on Thursday, but I do not see a posting, can we discuss ?

## 2017-03-31 NOTE — Telephone Encounter (Signed)
Patient called asking about pre op appointment.  He hasnt heard from anyone.  Please call him  Thanks

## 2017-04-01 ENCOUNTER — Encounter: Payer: Self-pay | Admitting: *Deleted

## 2017-04-01 ENCOUNTER — Other Ambulatory Visit: Payer: Self-pay | Admitting: Orthopedic Surgery

## 2017-04-01 NOTE — Telephone Encounter (Signed)
IF YOU CAN GET PRE-CERTED I CAN DO IT THURS NOT SURE WHAT HAPPENED

## 2017-04-01 NOTE — Progress Notes (Signed)
Authorization [id number] received from [employer] for CPT [id number] scheduled for 04/03/2017.

## 2017-04-01 NOTE — Telephone Encounter (Signed)
I have already gotten the precert, he is good to go. Thank you

## 2017-04-01 NOTE — Patient Instructions (Signed)
John LINVILLE Sr.  04/01/2017     @PREFPERIOPPHARMACY @   Your procedure is scheduled on  04/03/2017   Report to [facility] at  845  A.M.  Call this number if you have problems the morning of surgery:  [phone]   Remember:  Do not eat food or drink liquids after midnight.  Take these medicines the morning of surgery with A SIP OF WATER  Amlodipine, hydrocodone, lisinopril, zofran, protonix.   Do not wear jewelry, make-up or nail polish.  Do not wear lotions, powders, or perfumes, or deoderant.  Do not shave 48 hours prior to surgery.  Men may shave face and neck.  Do not bring valuables to the hospital.  [facility] is not responsible for any belongings or valuables.  Contacts, dentures or bridgework may not be worn into surgery.  Leave your suitcase in the car.  After surgery it may be brought to your room.  For patients admitted to the hospital, discharge time will be determined by your treatment team.  Patients discharged the day of surgery will not be allowed to drive home.   Name and phone number of your driver:   family Special instructions:  None  Please read over the following fact sheets that you were given. Anesthesia Post-op Instructions and Care and Recovery After Surgery      Knee Arthroscopy Knee arthroscopy is a surgical procedure that is used to examine the inside of your knee joint and repair any damage. The surgeon puts a small, lighted instrument with a camera on the tip (arthroscope) through a small incision in your knee. The camera sends pictures to a monitor in the operating room. Your surgeon uses those pictures to guide the surgical instruments through other incisions to the area of damage. Knee arthroscopy can be used to treat many types of knee problems. It may be used:  To repair a torn ligament.  To repair or remove damaged tissue.  To remove a fluid-filled sac (cyst) from your knee.  Tell a health care provider  about:  Any allergies you have.  All medicines you are taking, including vitamins, herbs, eye drops, creams, and over-the-counter medicines.  Any problems you or family members have had with anesthetic medicines.  Any blood disorders you have.  Any surgeries you have had.  Any medical conditions you have. What are the risks? Generally, this is a safe procedure. However, problems may occur, including:  Infection.  Bleeding.  Damage to blood vessels, nerves, or structures of your knee.  A blood clot that forms in your leg and travels to your lung.  Failure to relieve symptoms.  What happens before the procedure?  Ask your health care provider about: ? Changing or stopping your regular medicines. This is especially important if you are taking diabetes medicines or blood thinners. ? Taking medicines such as aspirin and ibuprofen. These medicines can thin your blood. Do not take these medicines before your procedure if your health care provider instructs you not to.  Follow your health care provider's instructions about eating or drinking restrictions.  Plan to have someone take you home after the procedure.  If you go home right after the procedure, plan to have someone with you for 24 hours.  Do not drink alcohol unless your health care provider says that you can.  Do not use any tobacco products, including cigarettes, chewing tobacco, or electronic cigarettes unless your health care provider  says that you can. If you need help quitting, ask your health care provider.  You may have a physical exam. What happens during the procedure?  An IV tube will be inserted into one of your veins.  You will be given one or more of the following: ? A medicine that helps you relax (sedative). ? A medicine that numbs the area (local anesthetic). ? A medicine that makes you fall asleep (general anesthetic). ? A medicine that is injected into your spine that numbs the area below and  slightly above the injection site (spinal anesthetic). ? A medicine that is injected into an area of your body that numbs everything below the injection site (regional anesthetic).  A cuff may be placed around your upper leg to slow bleeding during the procedure.  The surgeon will make a small number of incisions around your knee.  Your knee joint will be flushed and filled with a germ-free (sterile) solution.  The arthroscope will be passed through an incision into your knee joint.  More instruments will be passed through other incisions to repair your knee as needed.  The fluid will be removed from your knee.  The incisions will be closed with adhesive strips or stitches (sutures).  A bandage (dressing) will be placed over your knee. The procedure may vary among health care providers and hospitals. What happens after the procedure?  Your blood pressure, heart rate, breathing rate and blood oxygen level will be monitored often until the medicines you were given have worn off.  You may be given medicine for pain.  You may get crutches to help you walk without using your knee to support your body weight.  You may have to wear compression stockings. These stocking help to prevent blood clots and reduce swelling in your legs. This information is not intended to replace advice given to you by your health care provider. Make sure you discuss any questions you have with your health care provider. Document Released: 05/03/2000 Document Revised: 10/12/2015 Document Reviewed: 05/02/2014 Elsevier Interactive Patient Education  2017 [place]. Knee Arthroscopy, Care After Refer to this sheet in the next few weeks. These instructions provide you with information about caring for yourself after your procedure. Your health care provider may also give you more specific instructions. Your treatment has been planned according to current medical practices, but problems sometimes occur. Call your  health care provider if you have any problems or questions after your procedure. What can I expect after the procedure? After the procedure, it is common to have:  Soreness.  Pain.  Follow these instructions at home: Bathing  Do not take baths, swim, or use a hot tub until your health care provider approves. Incision care  There are many different ways to close and cover an incision, including stitches, skin glue, and adhesive strips. Follow your health care provider's instructions about: ? Incision care. ? Bandage (dressing) changes and removal. ? Incision closure removal.  Check your incision area every day for signs of infection. Watch for: ? Redness, swelling, or pain. ? Fluid, blood, or pus. Activity  Avoid strenuous activities for as long as directed by your health care provider.  Return to your normal activities as directed by your health care provider. Ask your health care provider what activities are safe for you.  Perform range-of-motion exercises only as directed by your health care provider.  Do not lift anything that is heavier than 10 lb (4.5 kg).  Do not drive or operate heavy  machinery while taking pain medicine.  If you were given crutches, use them as directed by your health care provider. Managing pain, stiffness, and swelling  If directed, apply ice to the injured area: ? Put ice in a plastic bag. ? Place a towel between your skin and the bag. ? Leave the ice on for 20 minutes, 2-3 times per day.  Raise the injured area above the level of your heart while you are sitting or lying down as directed by your health care provider. General instructions  Keep all follow-up visits as directed by your health care provider. This is important.  Take medicines only as directed by your health care provider.  Do not use any tobacco products, including cigarettes, chewing tobacco, or electronic cigarettes. If you need help quitting, ask your health care  provider.  If you were given compression stockings, wear them as directed by your health care provider. These stockings help prevent blood clots and reduce swelling in your legs. Contact a health care provider if:  You have severe pain with any movement of your knee.  You notice a bad smell coming from the incision or dressing.  You have redness, swelling, or pain at the site of your incision.  You have fluid, blood, or pus coming from your incision. Get help right away if:  You develop a rash.  You have a fever.  You have difficulty breathing or have shortness of breath.  You develop pain in your calves or in the back of your knee.  You develop chest pain.  You develop numbness or tingling in your leg or foot. This information is not intended to replace advice given to you by your health care provider. Make sure you discuss any questions you have with your health care provider. Document Released: 11/23/2004 Document Revised: 10/06/2015 Document Reviewed: 05/02/2014 Elsevier Interactive Patient Education  2017 [place] Anesthesia, Adult General anesthesia is the use of medicines to make a person "go to sleep" (be unconscious) for a medical procedure. General anesthesia is often recommended when a procedure:  Is long.  Requires you to be still or in an unusual position.  Is major and can cause you to lose blood.  Is impossible to do without general anesthesia.  The medicines used for general anesthesia are called general anesthetics. In addition to making you sleep, the medicines:  Prevent pain.  Control your blood pressure.  Relax your muscles.  Tell a health care provider about:  Any allergies you have.  All medicines you are taking, including vitamins, herbs, eye drops, creams, and over-the-counter medicines.  Any problems you or family members have had with anesthetic medicines.  Types of anesthetics you have had in the past.  Any bleeding  disorders you have.  Any surgeries you have had.  Any medical conditions you have.  Any history of heart or lung conditions, such as heart failure, sleep apnea, or chronic obstructive pulmonary disease (COPD).  Whether you are pregnant or may be pregnant.  Whether you use tobacco, alcohol, marijuana, or street drugs.  Any history of Armed forces logistics/support/administrative officer.  Any history of depression or anxiety. What are the risks? Generally, this is a safe procedure. However, problems may occur, including:  Allergic reaction to anesthetics.  Lung and heart problems.  Inhaling food or liquids from your stomach into your lungs (aspiration).  Injury to nerves.  Waking up during your procedure and being unable to move (rare).  Extreme agitation or a state of mental confusion (  delirium) when you wake up from the anesthetic.  Air in the bloodstream, which can lead to stroke.  These problems are more likely to develop if you are having a major surgery or if you have an advanced medical condition. You can prevent some of these complications by answering all of your health care provider's questions thoroughly and by following all pre-procedure instructions. General anesthesia can cause side effects, including:  Nausea or vomiting  A sore throat from the breathing tube.  Feeling cold or shivery.  Feeling tired, washed out, or achy.  Sleepiness or drowsiness.  Confusion or agitation.  What happens before the procedure? Staying hydrated Follow instructions from your health care provider about hydration, which may include:  Up to 2 hours before the procedure - you may continue to drink clear liquids, such as water, clear fruit juice, black coffee, and plain tea.  Eating and drinking restrictions Follow instructions from your health care provider about eating and drinking, which may include:  8 hours before the procedure - stop eating heavy meals or foods such as meat, fried foods, or fatty  foods.  6 hours before the procedure - stop eating light meals or foods, such as toast or cereal.  6 hours before the procedure - stop drinking milk or drinks that contain milk.  2 hours before the procedure - stop drinking clear liquids.  Medicines  Ask your health care provider about: ? Changing or stopping your regular medicines. This is especially important if you are taking diabetes medicines or blood thinners. ? Taking medicines such as aspirin and ibuprofen. These medicines can thin your blood. Do not take these medicines before your procedure if your health care provider instructs you not to. ? Taking new dietary supplements or medicines. Do not take these during the week before your procedure unless your health care provider approves them.  If you are told to take a medicine or to continue taking a medicine on the day of the procedure, take the medicine with sips of water. General instructions   Ask if you will be going home the same day, the following day, or after a longer hospital stay. ? Plan to have someone take you home. ? Plan to have someone stay with you for the first 24 hours after you leave the hospital or clinic.  For 3-6 weeks before the procedure, try not to use any tobacco products, such as cigarettes, chewing tobacco, and e-cigarettes.  You may brush your teeth on the morning of the procedure, but make sure to spit out the toothpaste. What happens during the procedure?  You will be given anesthetics through a mask and through an IV tube in one of your veins.  You may receive medicine to help you relax (sedative).  As soon as you are asleep, a breathing tube may be used to help you breathe.  An anesthesia specialist will stay with you throughout the procedure. He or she will help keep you comfortable and safe by continuing to give you medicines and adjusting the amount of medicine that you get. He or she will also watch your blood pressure, pulse, and oxygen  levels to make sure that the anesthetics do not cause any problems.  If a breathing tube was used to help you breathe, it will be removed before you wake up. The procedure may vary among health care providers and hospitals. What happens after the procedure?  You will wake up, often slowly, after the procedure is complete, usually in a  recovery area.  Your blood pressure, heart rate, breathing rate, and blood oxygen level will be monitored until the medicines you were given have worn off.  You may be given medicine to help you calm down if you feel anxious or agitated.  If you will be going home the same day, your health care provider may check to make sure you can stand, drink, and urinate.  Your health care providers will treat your pain and side effects before you go home.  Do not drive for 24 hours if you received a sedative.  You may: ? Feel nauseous and vomit. ? Have a sore throat. ? Have mental slowness. ? Feel cold or shivery. ? Feel sleepy. ? Feel tired. ? Feel sore or achy, even in parts of your body where you did not have surgery. This information is not intended to replace advice given to you by your health care provider. Make sure you discuss any questions you have with your health care provider. Document Released: 08/13/2007 Document Revised: 10/17/2015 Document Reviewed: 04/20/2015 Elsevier Interactive Patient Education  2018 [place] Anesthesia, Adult, Care After These instructions provide you with information about caring for yourself after your procedure. Your health care provider may also give you more specific instructions. Your treatment has been planned according to current medical practices, but problems sometimes occur. Call your health care provider if you have any problems or questions after your procedure. What can I expect after the procedure? After the procedure, it is common to have:  Vomiting.  A sore throat.  Mental slowness.  It is  common to feel:  Nauseous.  Cold or shivery.  Sleepy.  Tired.  Sore or achy, even in parts of your body where you did not have surgery.  Follow these instructions at home: For at least 24 hours after the procedure:  Do not: ? Participate in activities where you could fall or become injured. ? Drive. ? Use heavy machinery. ? Drink alcohol. ? Take sleeping pills or medicines that cause drowsiness. ? Make important decisions or sign legal documents. ? Take care of children on your own.  Rest. Eating and drinking  If you vomit, drink water, juice, or soup when you can drink without vomiting.  Drink enough fluid to keep your urine clear or pale yellow.  Make sure you have little or no nausea before eating solid foods.  Follow the diet recommended by your health care provider. General instructions  Have a responsible adult stay with you until you are awake and alert.  Return to your normal activities as told by your health care provider. Ask your health care provider what activities are safe for you.  Take over-the-counter and prescription medicines only as told by your health care provider.  If you smoke, do not smoke without supervision.  Keep all follow-up visits as told by your health care provider. This is important. Contact a health care provider if:  You continue to have nausea or vomiting at home, and medicines are not helpful.  You cannot drink fluids or start eating again.  You cannot urinate after 8-12 hours.  You develop a skin rash.  You have fever.  You have increasing redness at the site of your procedure. Get help right away if:  You have difficulty breathing.  You have chest pain.  You have unexpected bleeding.  You feel that you are having a life-threatening or urgent problem. This information is not intended to replace advice given to you by your  health care provider. Make sure you discuss any questions you have with your health care  provider. Document Released: 08/12/2000 Document Revised: 10/09/2015 Document Reviewed: 04/20/2015 Elsevier Interactive Patient Education  [employer].

## 2017-04-02 ENCOUNTER — Other Ambulatory Visit: Payer: Self-pay

## 2017-04-02 ENCOUNTER — Encounter (HOSPITAL_COMMUNITY): Payer: Self-pay

## 2017-04-02 ENCOUNTER — Encounter (HOSPITAL_COMMUNITY)
Admission: RE | Admit: 2017-04-02 | Discharge: 2017-04-02 | Disposition: A | Discharge status: Home / Self Care | Payer: Medicare HMO | Source: Ambulatory Visit | Attending: Orthopedic Surgery | Admitting: Orthopedic Surgery

## 2017-04-02 DIAGNOSIS — F329 Major depressive disorder, single episode, unspecified: Secondary | ICD-10-CM | POA: Diagnosis not present

## 2017-04-02 DIAGNOSIS — I10 Essential (primary) hypertension: Secondary | ICD-10-CM | POA: Diagnosis not present

## 2017-04-02 DIAGNOSIS — S83241A Other tear of medial meniscus, current injury, right knee, initial encounter: Secondary | ICD-10-CM | POA: Diagnosis present

## 2017-04-02 DIAGNOSIS — Z87891 Personal history of nicotine dependence: Secondary | ICD-10-CM | POA: Diagnosis not present

## 2017-04-02 DIAGNOSIS — M23321 Other meniscus derangements, posterior horn of medial meniscus, right knee: Secondary | ICD-10-CM | POA: Diagnosis not present

## 2017-04-02 DIAGNOSIS — J449 Chronic obstructive pulmonary disease, unspecified: Secondary | ICD-10-CM | POA: Diagnosis not present

## 2017-04-02 HISTORY — DX: Unspecified osteoarthritis, unspecified site: M19.90

## 2017-04-02 LAB — CBC WITH DIFFERENTIAL/PLATELET
Basophils Absolute: 0 10*3/uL (ref 0.0–0.1)
Basophils Relative: 0 %
Eosinophils Absolute: 0.3 10*3/uL (ref 0.0–0.7)
Eosinophils Relative: 3 %
HCT: 44.7 % (ref 39.0–52.0)
Hemoglobin: 15.8 g/dL (ref 13.0–17.0)
Lymphocytes Relative: 18 %
Lymphs Abs: 1.9 10*3/uL (ref 0.7–4.0)
MCH: 33.5 pg (ref 26.0–34.0)
MCHC: 35.3 g/dL (ref 30.0–36.0)
MCV: 94.7 fL (ref 78.0–100.0)
Monocytes Absolute: 1 10*3/uL (ref 0.1–1.0)
Monocytes Relative: 10 %
Neutro Abs: 7.2 10*3/uL (ref 1.7–7.7)
Neutrophils Relative %: 69 %
Platelets: 317 10*3/uL (ref 150–400)
RBC: 4.72 MIL/uL (ref 4.22–5.81)
RDW: 11.8 % (ref 11.5–15.5)
WBC: 10.4 10*3/uL (ref 4.0–10.5)

## 2017-04-02 LAB — BASIC METABOLIC PANEL
Anion gap: 10 (ref 5–15)
BUN: 15 mg/dL (ref 6–20)
CO2: 23 mmol/L (ref 22–32)
Calcium: 9.6 mg/dL (ref 8.9–10.3)
Chloride: 100 mmol/L — ABNORMAL LOW (ref 101–111)
Creatinine, Ser: 0.89 mg/dL (ref 0.61–1.24)
GFR calc Af Amer: >60 mL/min (ref >60)
GFR calc non Af Amer: >60 mL/min (ref >60)
Glucose, Bld: 130 mg/dL — ABNORMAL HIGH (ref 65–99)
Potassium: 4 mmol/L (ref 3.5–5.1)
Sodium: 133 mmol/L — ABNORMAL LOW (ref 135–145)

## 2017-04-02 LAB — SURGICAL PCR SCREEN
MRSA, PCR: NEGATIVE
Staphylococcus aureus: NEGATIVE

## 2017-04-02 NOTE — H&P (Signed)
Patient ID: John Bridges., male   DOB: 1955/06/16, 61 y.o.   MRN: [id number]      Chief Complaint  Patient presents with  . Knee Pain    right surgical consult    61 year old male presents for preop evaluation for arthroscopy right knee  The patient has a 4 month history of pain in his right knee unrelieved by cortisone injection. He eventually had MRI right knee which showed torn medial meniscus osteoarthritis stress fracture medial femoral condyle.  He complains of medial and lateral joint line pain stiffness after sitting.  The pain is constant dull worsen with activity relieved with rest and is severe enough to cause pain despite being on hydrocodone.  He takes hydrocodone for his shoulder     Review of Systems  Constitutional: Negative for fever.  Cardiovascular: Negative for chest pain.  Neurological: Negative for tingling, tremors and sensory change.  Endo/Heme/Allergies: Negative for environmental allergies and polydipsia. Does not bruise/bleed easily.   ActiveMedications      Current Meds  Medication Sig  . ALPRAZolam (XANAX) 0.5 MG tablet   . amLODipine (NORVASC) 10 MG tablet Take 10 mg by mouth daily.  Marland Kitchen aspirin EC 81 MG tablet Take 81 mg by mouth daily.  . Glycopyrrolate-Formoterol (BEVESPI AEROSPHERE) 9-4.8 MCG/ACT AERO Inhale 2 puffs into the lungs 2 (two) times daily.  Marland Kitchen HYDROcodone-acetaminophen (NORCO) 7.5-325 MG tablet Take 1 tablet by mouth every 6 (six) hours as needed for moderate pain (Must last 30 days.Do not drive or operate machinery while taking this medicine.).  Marland Kitchen lisinopril-hydrochlorothiazide (PRINZIDE,ZESTORETIC) 20-12.5 MG per tablet Take 1 tablet by mouth daily.  . ondansetron (ZOFRAN) 4 MG tablet Take 1 tablet (4 mg total) by mouth as needed for nausea or vomiting.  . pantoprazole (PROTONIX) 40 MG tablet Take 1 tablet (40 mg total) by mouth daily.  . pravastatin (PRAVACHOL) 40 MG tablet Take 40 mg by mouth daily.           Past Medical History:  Diagnosis Date  . COPD (chronic obstructive pulmonary disease) ([place])   . Depression   . Essential hypertension   . History of bronchitis   . Hyperlipidemia   . Lung nodule    Right upper lobe lung nodule 1.3 cm   . Shortness of breath dyspnea      No Known Allergies  BP 113/63   Pulse 79   Ht 5\' 7"  (1.702 m)   Wt 199 lb (90.3 kg)   BMI 31.17 kg/m    Physical Exam Gen. appearance the patient's appearance is normal with normal grooming and  hygiene The patient is oriented to person place and time Mood and affect are normal   Left Knee Exam   Muscle Strength  Normal left knee strength  Right Knee Exam   Muscle Strength  Normal right knee strength     Gait normal no limp   Physical Exam   Right Knee Exam   Tenderness  The patient is experiencing tenderness in the lateral joint line and medial joint line.  Range of Motion  Extension: normal  Flexion:  120 normal   Muscle Strength   The patient has normal right knee strength.  Tests  McMurray:  Medial - positive Lateral - negative Drawer:       Anterior - negative    Posterior - negative Varus: negative Valgus: negative  Other  Erythema: absent Scars: absent Sensation: normal Pulse: present Swelling: none   Left Knee Exam  Tenderness  The patient is experiencing no tenderness.     Range of Motion  Extension: normal  Flexion:  120 normal   Muscle Strength   The patient has normal left knee strength.  Tests  McMurray:  Medial - negative Lateral - negative Drawer:       Anterior - negative     Posterior - negative Varus: negative Valgus: negative  Other  Erythema: absent Scars: absent Sensation: normal Pulse: present Swelling: none      Medical decision-making  Imaging x-ray show symmetric joint space narrowing no evidence of secondary bone changes from arthritis  MRI shows torn  medial meniscus arthritis of stay 3 compartments degeneration of the cruciate and complex medial meniscus tear  Those are independent interpretation [employer]  Full reports are reviewed as well and are in the record referred to by reference  This is a new problem to me it's established it's not improved  We recommend surgery       Encounter Diagnoses  Name Primary?  . Derangement of posterior horn of medial meniscus of right knee Yes  . Primary osteoarthritis of right knee   . Stress fracture of right femur, initial encounter       Arthroscopy right knee partial medial meniscectomy - [phone]

## 2017-04-03 ENCOUNTER — Encounter (HOSPITAL_COMMUNITY): Payer: Self-pay | Admitting: *Deleted

## 2017-04-03 ENCOUNTER — Ambulatory Visit (HOSPITAL_COMMUNITY)
Admission: RE | Admit: 2017-04-03 | Discharge: 2017-04-03 | Disposition: A | Discharge status: Home / Self Care | Payer: Medicare HMO | Source: Ambulatory Visit | Attending: Orthopedic Surgery | Admitting: Orthopedic Surgery

## 2017-04-03 ENCOUNTER — Ambulatory Visit (HOSPITAL_COMMUNITY): Payer: Medicare HMO | Admitting: Anesthesiology

## 2017-04-03 ENCOUNTER — Encounter (HOSPITAL_COMMUNITY)
Admission: RE | Disposition: A | Discharge status: Home / Self Care | Payer: Self-pay | Source: Ambulatory Visit | Attending: Orthopedic Surgery

## 2017-04-03 DIAGNOSIS — F329 Major depressive disorder, single episode, unspecified: Secondary | ICD-10-CM | POA: Insufficient documentation

## 2017-04-03 DIAGNOSIS — J449 Chronic obstructive pulmonary disease, unspecified: Secondary | ICD-10-CM | POA: Insufficient documentation

## 2017-04-03 DIAGNOSIS — M23203 Derangement of unspecified medial meniscus due to old tear or injury, right knee: Secondary | ICD-10-CM | POA: Diagnosis not present

## 2017-04-03 DIAGNOSIS — I10 Essential (primary) hypertension: Secondary | ICD-10-CM | POA: Insufficient documentation

## 2017-04-03 DIAGNOSIS — Z87891 Personal history of nicotine dependence: Secondary | ICD-10-CM | POA: Diagnosis not present

## 2017-04-03 DIAGNOSIS — M23321 Other meniscus derangements, posterior horn of medial meniscus, right knee: Secondary | ICD-10-CM | POA: Insufficient documentation

## 2017-04-03 HISTORY — PX: KNEE ARTHROSCOPY WITH MEDIAL MENISECTOMY: SHX5651

## 2017-04-03 SURGERY — ARTHROSCOPY, KNEE, WITH MEDIAL MENISCECTOMY
Anesthesia: General | Site: Knee | Laterality: Right

## 2017-04-03 MED ORDER — MIDAZOLAM HCL 2 MG/2ML IJ SOLN
INTRAMUSCULAR | Status: AC
  Filled 2017-04-03: qty 2

## 2017-04-03 MED ORDER — SODIUM CHLORIDE 0.9 % IR SOLN
Status: DC | PRN
  Administered 2017-04-03 (×4): 3000 mL

## 2017-04-03 MED ORDER — NEOSTIGMINE METHYLSULFATE 10 MG/10ML IV SOLN
INTRAVENOUS | Status: DC | PRN
  Administered 2017-04-03: 3 mg via INTRAVENOUS

## 2017-04-03 MED ORDER — MIDAZOLAM HCL 2 MG/2ML IJ SOLN
1.0000 mg | INTRAMUSCULAR | Status: AC
  Administered 2017-04-03: 2 mg via INTRAVENOUS

## 2017-04-03 MED ORDER — DEXAMETHASONE SODIUM PHOSPHATE 4 MG/ML IJ SOLN
4.0000 mg | Freq: Once | INTRAMUSCULAR | Status: AC
Start: 2017-04-03 — End: 2017-04-03
  Administered 2017-04-03: 4 mg via INTRAVENOUS

## 2017-04-03 MED ORDER — FENTANYL CITRATE (PF) 100 MCG/2ML IJ SOLN: 25.0000 ug | INTRAMUSCULAR | Status: DC | PRN

## 2017-04-03 MED ORDER — CHLORHEXIDINE GLUCONATE 4 % EX LIQD: 60.0000 mL | Freq: Once | CUTANEOUS | Status: DC

## 2017-04-03 MED ORDER — FENTANYL CITRATE (PF) 250 MCG/5ML IJ SOLN
INTRAMUSCULAR | Status: AC
  Filled 2017-04-03: qty 5

## 2017-04-03 MED ORDER — IPRATROPIUM-ALBUTEROL 0.5-2.5 (3) MG/3ML IN SOLN
3.0000 mL | Freq: Once | RESPIRATORY_TRACT | Status: AC
  Administered 2017-04-03: 3 mL via RESPIRATORY_TRACT

## 2017-04-03 MED ORDER — LIDOCAINE HCL (CARDIAC) 10 MG/ML IV SOLN
INTRAVENOUS | Status: DC | PRN
  Administered 2017-04-03: 50 mg via INTRAVENOUS

## 2017-04-03 MED ORDER — GLYCOPYRROLATE 0.2 MG/ML IJ SOLN
INTRAMUSCULAR | Status: DC | PRN
  Administered 2017-04-03: .6 mg via INTRAVENOUS

## 2017-04-03 MED ORDER — FENTANYL CITRATE (PF) 100 MCG/2ML IJ SOLN
INTRAMUSCULAR | Status: DC | PRN
  Administered 2017-04-03 (×3): 50 ug via INTRAVENOUS

## 2017-04-03 MED ORDER — BUPIVACAINE-EPINEPHRINE (PF) 0.5% -1:200000 IJ SOLN
INTRAMUSCULAR | Status: DC | PRN
  Administered 2017-04-03: 60 mL via PERINEURAL

## 2017-04-03 MED ORDER — SODIUM CHLORIDE 0.9 % IR SOLN
Status: DC | PRN
  Administered 2017-04-03: 1000 mL

## 2017-04-03 MED ORDER — LACTATED RINGERS IV SOLN
INTRAVENOUS | Status: DC
  Administered 2017-04-03: 1000 mL via INTRAVENOUS

## 2017-04-03 MED ORDER — CEFAZOLIN SODIUM-DEXTROSE 2-4 GM/100ML-% IV SOLN
2.0000 g | INTRAVENOUS | Status: AC
  Administered 2017-04-03: 2 g via INTRAVENOUS
  Filled 2017-04-03: qty 100

## 2017-04-03 MED ORDER — HYDROCODONE-ACETAMINOPHEN 10-325 MG PO TABS: 1.0000 | ORAL_TABLET | ORAL | 0 refills | Status: DC | PRN

## 2017-04-03 MED ORDER — ROCURONIUM 10MG/ML (10ML) SYRINGE FOR MEDFUSION PUMP - OPTIME
INTRAVENOUS | Status: DC | PRN
  Administered 2017-04-03: 25 mg via INTRAVENOUS
  Administered 2017-04-03: 5 mg via INTRAVENOUS

## 2017-04-03 MED ORDER — IPRATROPIUM-ALBUTEROL 0.5-2.5 (3) MG/3ML IN SOLN
RESPIRATORY_TRACT | Status: AC
  Filled 2017-04-03: qty 3

## 2017-04-03 MED ORDER — PROPOFOL 10 MG/ML IV BOLUS
INTRAVENOUS | Status: DC | PRN
  Administered 2017-04-03: 200 mg via INTRAVENOUS

## 2017-04-03 MED ORDER — POVIDONE-IODINE 10 % EX SWAB: 2.0000 "application " | Freq: Once | CUTANEOUS | Status: DC

## 2017-04-03 MED ORDER — SUCCINYLCHOLINE 20MG/ML (10ML) SYRINGE FOR MEDFUSION PUMP - OPTIME
INTRAMUSCULAR | Status: DC | PRN
  Administered 2017-04-03: 120 mg via INTRAVENOUS

## 2017-04-03 MED ORDER — DEXAMETHASONE SODIUM PHOSPHATE 4 MG/ML IJ SOLN
INTRAMUSCULAR | Status: AC
  Filled 2017-04-03: qty 1

## 2017-04-03 MED ORDER — BUPIVACAINE-EPINEPHRINE (PF) 0.5% -1:200000 IJ SOLN
INTRAMUSCULAR | Status: AC
  Filled 2017-04-03: qty 60

## 2017-04-03 SURGICAL SUPPLY — 49 items
ARTHROWAND PARAGON T2 (SURGICAL WAND)
BAG HAMPER (MISCELLANEOUS) ×2 IMPLANT
BANDAGE ELASTIC 6 LF NS (GAUZE/BANDAGES/DRESSINGS) ×2 IMPLANT
BLADE AGGRESSIVE PLUS 4.0 (BLADE) ×2 IMPLANT
BLADE SURG SZ11 CARB STEEL (BLADE) ×2 IMPLANT
CHLORAPREP W/TINT 26ML (MISCELLANEOUS) ×2 IMPLANT
CLOTH BEACON ORANGE TIMEOUT ST (SAFETY) ×2 IMPLANT
COOLER CRYO IC GRAV AND TUBE (ORTHOPEDIC SUPPLIES) ×2 IMPLANT
CUFF CRYO KNEE18X23 MED (MISCELLANEOUS) ×2 IMPLANT
CUFF TOURNIQUET SINGLE 34IN LL (TOURNIQUET CUFF) ×2 IMPLANT
CUTTER ANGLED DBL BITE 4.5 (BURR) IMPLANT
DECANTER SPIKE VIAL GLASS SM (MISCELLANEOUS) ×4 IMPLANT
GAUZE SPONGE 4X4 12PLY STRL (GAUZE/BANDAGES/DRESSINGS) ×2 IMPLANT
GAUZE SPONGE 4X4 16PLY XRAY LF (GAUZE/BANDAGES/DRESSINGS) ×2 IMPLANT
GAUZE XEROFORM 5X9 LF (GAUZE/BANDAGES/DRESSINGS) ×2 IMPLANT
GLOVE BIOGEL PI IND STRL 7.0 (GLOVE) ×1 IMPLANT
GLOVE BIOGEL PI INDICATOR 7.0 (GLOVE) ×1
GLOVE SKINSENSE NS SZ8.0 LF (GLOVE) ×1
GLOVE SKINSENSE STRL SZ8.0 LF (GLOVE) ×1 IMPLANT
GLOVE SS N UNI LF 8.5 STRL (GLOVE) ×2 IMPLANT
GOWN STRL REUS W/ TWL LRG LVL3 (GOWN DISPOSABLE) ×1 IMPLANT
GOWN STRL REUS W/TWL LRG LVL3 (GOWN DISPOSABLE) ×1
GOWN STRL REUS W/TWL XL LVL3 (GOWN DISPOSABLE) ×2 IMPLANT
HLDR LEG FOAM (MISCELLANEOUS) ×1 IMPLANT
IV NS IRRIG 3000ML ARTHROMATIC (IV SOLUTION) ×8 IMPLANT
KIT BLADEGUARD II DBL (SET/KITS/TRAYS/PACK) ×2 IMPLANT
KIT ROOM TURNOVER AP CYSTO (KITS) ×2 IMPLANT
LEG HOLDER FOAM (MISCELLANEOUS) ×1
MANIFOLD NEPTUNE II (INSTRUMENTS) ×2 IMPLANT
MARKER SKIN DUAL TIP RULER LAB (MISCELLANEOUS) ×2 IMPLANT
NEEDLE HYPO 18GX1.5 BLUNT FILL (NEEDLE) ×2 IMPLANT
NEEDLE HYPO 21X1.5 SAFETY (NEEDLE) ×2 IMPLANT
NEEDLE SPNL 18GX3.5 QUINCKE PK (NEEDLE) ×2 IMPLANT
NS IRRIG 1000ML POUR BTL (IV SOLUTION) ×2 IMPLANT
PACK ARTHRO LIMB DRAPE STRL (MISCELLANEOUS) ×2 IMPLANT
PAD ABD 5X9 TENDERSORB (GAUZE/BANDAGES/DRESSINGS) ×2 IMPLANT
PAD ARMBOARD 7.5X6 YLW CONV (MISCELLANEOUS) ×2 IMPLANT
PADDING CAST COTTON 6X4 STRL (CAST SUPPLIES) ×2 IMPLANT
PADDING WEBRIL 6 STERILE (GAUZE/BANDAGES/DRESSINGS) ×2 IMPLANT
PROBE BIPOLAR 50 DEGREE SUCT (MISCELLANEOUS) ×2 IMPLANT
PROBE BIPOLAR ATHRO 135MM 90D (MISCELLANEOUS) IMPLANT
SET ARTHROSCOPY INST (INSTRUMENTS) ×2 IMPLANT
SET ARTHROSCOPY PUMP TUBE (IRRIGATION / IRRIGATOR) ×2 IMPLANT
SET BASIN LINEN APH (SET/KITS/TRAYS/PACK) ×2 IMPLANT
SUT ETHILON 3 0 FSL (SUTURE) ×2 IMPLANT
SYR 30ML LL (SYRINGE) ×2 IMPLANT
SYRINGE 10CC LL (SYRINGE) ×2 IMPLANT
TUBE CONNECTING 12X1/4 (SUCTIONS) ×6 IMPLANT
WAND ARTHRO PARAGON T2 (SURGICAL WAND) IMPLANT

## 2017-04-03 NOTE — Interval H&P Note (Signed)
History and Physical Interval Note:  04/03/2017 8:49 AM  John Mote Sr.  has presented today for surgery, with the diagnosis of TORN MEDIAL MENISCUS RIGHT KNEE  The various methods of treatment have been discussed with the patient and family. After consideration of risks, benefits and other options for treatment, the patient has consented to  Procedure(s): KNEE ARTHROSCOPY WITH MEDIAL MENISECTOMY (Right) as a surgical intervention .  The patient's history has been reviewed, patient examined, no change in status, stable for surgery.  I have reviewed the patient's chart and labs.  Questions were answered to the patient's satisfaction.     Arther Abbott

## 2017-04-03 NOTE — Discharge Instructions (Signed)
Knee Arthroscopy, Care After °Refer to this sheet in the next few weeks. These instructions provide you with information about caring for yourself after your procedure. Your health care provider may also give you more specific instructions. Your treatment has been planned according to current medical practices, but problems sometimes occur. Call your health care provider if you have any problems or questions after your procedure. °What can I expect after the procedure? °After the procedure, it is common to have: °· Soreness. °· Pain. ° °Follow these instructions at home: °Bathing °· Do not take baths, swim, or use a hot tub until your health care provider approves. °Incision care °· There are many different ways to close and cover an incision, including stitches, skin glue, and adhesive strips. Follow your health care provider’s instructions about: °? Incision care. °? Bandage (dressing) changes and removal. °? Incision closure removal. °· Check your incision area every day for signs of infection. Watch for: °? Redness, swelling, or pain. °? Fluid, blood, or pus. °Activity °· Avoid strenuous activities for as long as directed by your health care provider. °· Return to your normal activities as directed by your health care provider. Ask your health care provider what activities are safe for you. °· Perform range-of-motion exercises only as directed by your health care provider. °· Do not lift anything that is heavier than 10 lb (4.5 kg). °· Do not drive or operate heavy machinery while taking pain medicine. °· If you were given crutches, use them as directed by your health care provider. °Managing pain, stiffness, and swelling °· If directed, apply ice to the injured area: °? Put ice in a plastic bag. °? Place a towel between your skin and the bag. °? Leave the ice on for 20 minutes, 2-3 times per day. °· Raise the injured area above the level of your heart while you are sitting or lying down as directed by your  health care provider. °General instructions °· Keep all follow-up visits as directed by your health care provider. This is important. °· Take medicines only as directed by your health care provider. °· Do not use any tobacco products, including cigarettes, chewing tobacco, or electronic cigarettes. If you need help quitting, ask your health care provider. °· If you were given compression stockings, wear them as directed by your health care provider. These stockings help prevent blood clots and reduce swelling in your legs. °Contact a health care provider if: °· You have severe pain with any movement of your knee. °· You notice a bad smell coming from the incision or dressing. °· You have redness, swelling, or pain at the site of your incision. °· You have fluid, blood, or pus coming from your incision. °Get help right away if: °· You develop a rash. °· You have a fever. °· You have difficulty breathing or have shortness of breath. °· You develop pain in your calves or in the back of your knee. °· You develop chest pain. °· You develop numbness or tingling in your leg or foot. °This information is not intended to replace advice given to you by your health care provider. Make sure you discuss any questions you have with your health care provider. °Document Released: 11/23/2004 Document Revised: 10/06/2015 Document Reviewed: 05/02/2014 °Elsevier Interactive Patient Education © 2017 [employer] ° °

## 2017-04-03 NOTE — Anesthesia Preprocedure Evaluation (Addendum)
Anesthesia Evaluation  Patient identified by MRN, date of birth, ID band Patient awake    Reviewed: Allergy & Precautions, NPO status , Patient's Chart, lab work & pertinent test results  Airway Mallampati: I  TM Distance: >3 FB Neck ROM: Full    Dental  (+) Edentulous Upper, Edentulous Lower   Pulmonary shortness of breath and with exertion, COPD (chronic cough), former smoker,    + rhonchi (clear with cough)        Cardiovascular hypertension, Pt. on medications + DOE   Rhythm:Regular Rate:Normal     Neuro/Psych PSYCHIATRIC DISORDERS Depression    GI/Hepatic negative GI ROS, Neg liver ROS,   Endo/Other  negative endocrine ROS  Renal/GU negative Renal ROS     Musculoskeletal  (+) Arthritis ,   Abdominal   Peds  Hematology negative hematology ROS (+)   Anesthesia Other Findings   Reproductive/Obstetrics                            Anesthesia Physical Anesthesia Plan  ASA: III  Anesthesia Plan: General   Post-op Pain Management:    Induction: Intravenous  PONV Risk Score and Plan:   Airway Management Planned: Oral ETT  Additional Equipment:   Intra-op Plan:   Post-operative Plan: Extubation in OR  Informed Consent: I have reviewed the patients History and Physical, chart, labs and discussed the procedure including the risks, benefits and alternatives for the proposed anesthesia with the patient or authorized representative who has indicated his/her understanding and acceptance.     Plan Discussed with:   Anesthesia Plan Comments: (Duoneb Rx in pre-op.)       Anesthesia Quick Evaluation

## 2017-04-03 NOTE — Anesthesia Postprocedure Evaluation (Signed)
Anesthesia Post Note  Patient: John Bridges Sr.  Procedure(s) Performed: KNEE ARTHROSCOPY WITH MEDIAL MENISECTOMY (Right Knee)  Patient location during evaluation: PACU Anesthesia Type: General Level of consciousness: awake and alert and oriented Pain management: pain level controlled Vital Signs Assessment: post-procedure vital signs reviewed and stable Respiratory status: spontaneous breathing Cardiovascular status: blood pressure returned to baseline Postop Assessment: adequate PO intake Anesthetic complications: no     Last Vitals:  Vitals:   04/03/17 1130 04/03/17 1145  BP:  (!) 141/88  Pulse: 91 75  Resp: 14 11  Temp:    SpO2: 99% 95%    Last Pain:  Vitals:   04/03/17 1200  TempSrc:   PainSc: 2                  [name]

## 2017-04-03 NOTE — Brief Op Note (Signed)
04/03/2017  11:18 AM  PATIENT:  John Mote Sr.  61 y.o. male  PRE-OPERATIVE DIAGNOSIS:  TORN MEDIAL MENISCUS RIGHT KNEE  [id number]  POST-OPERATIVE DIAGNOSIS:  Same   KNEE ARTHROSCOPY WITH MEDIAL MENISECTOMY right  SURGEON:  Surgeon(s) and Role:    Carole Civil, MD - Primary  Operative findings  Extensive hemorrhagic synovitis throughout the joint  Torn medial meniscus right knee.  Medial femoral condyle chondromalacia and tibial plateau chondromalacia diffuse grade 2  Patellofemoral chondromalacia grade 2 on the patella grade 2 diffuse on the trochlea  Lateral meniscus normal lateral femoral condyle normal lateral tibial plateau grade I chondromalacia  Notch ACL PCL normal  PHYSICIAN ASSISTANT:   ASSISTANTS: none   ANESTHESIA:   none  EBL:  Total I/O In: 700 [I.V.:700] Out: 0   BLOOD ADMINISTERED:none  DRAINS: none   LOCAL MEDICATIONS USED:  Marcaine with epi 60 cc  SPECIMEN:  No Specimen  DISPOSITION OF SPECIMEN:  N/A  COUNTS:  YES  TOURNIQUET:    DICTATION: .Dragon Dictation  Knee arthroscopy dictation  The patient was identified in the preoperative holding area using 2 approved identification mechanisms. The chart was reviewed and updated. The surgical site was confirmed as right knee and marked.  The patient was taken to the operating room for anesthesia. After successful general anesthesia, Ancef 2 g was used as IV antibiotics.  The patient was placed in the supine position with the right the operative extremity in an arthroscopic leg holder and the opposite extremity in a padded leg holder.  The timeout was executed.  A lateral portal was established with an 11 blade and the scope was introduced into the joint. A diagnostic arthroscopy was performed in circumferential manner examining the entire knee joint. A medial portal was established and the diagnostic arthroscopy was repeated using a probe to palpate intra-articular structures as they  were encountered.   The operative findings are   Extensive hemorrhagic synovitis throughout the joint  Torn medial meniscus right knee.  Medial femoral condyle chondromalacia and tibial plateau chondromalacia diffuse grade 2  Patellofemoral chondromalacia grade 2 on the patella grade 2 diffuse on the trochlea  Lateral meniscus normal lateral femoral condyle normal lateral tibial plateau grade I chondromalacia  Notch ACL PCL normal   The right meniscus was resected using a duckbill forceps. The meniscal fragments were removed with a motorized shaver. The meniscus was balanced with a combination of a motorized shaver and a 50  wand until a stable rim was obtained.  A limited sign of ectomy was performed with the ArthroCare wand.  I used it to cauterize the extensive synovial irritation of the joint  The arthroscopic pump was placed on the wash mode and any excess debris was removed from the joint using suction.  60 cc of Marcaine with epinephrine was injected through the arthroscope.  The portals were closed with 3-0 nylon suture.  A sterile bandage, Ace wrap and Cryo/Cuff was placed and the Cryo/Cuff was activated. The patient was taken to the recovery room in stable condition.   PLAN OF CARE: Discharge to home after PACU  PATIENT DISPOSITION:  PACU - hemodynamically stable.   Delay start of Pharmacological VTE agent (>24hrs) due to surgical blood loss or risk of bleeding: not applicable

## 2017-04-03 NOTE — Transfer of Care (Signed)
Immediate Anesthesia Transfer of Care Note  Patient: John Mote Sr.  Procedure(s) Performed: KNEE ARTHROSCOPY WITH MEDIAL MENISECTOMY (Right Knee)  Patient Location: PACU  Anesthesia Type:General  Level of Consciousness: awake, alert  and oriented  Airway & Oxygen Therapy: Patient Spontanous Breathing and Patient connected to face mask oxygen  Post-op Assessment: Report given to RN  Post vital signs: Reviewed and stable  Last Vitals:  Vitals:   04/03/17 0900 04/03/17 1125  BP: 139/79   Resp: 16   Temp:  (P) 37 C  SpO2: 93% (P) 99%    Last Pain:  Vitals:   04/03/17 0822  TempSrc: Oral  PainSc: 2       Patients Stated Pain Goal: (P) 8 (41/28/20 8138)  Complications: No apparent anesthesia complications

## 2017-04-03 NOTE — Anesthesia Procedure Notes (Signed)
Procedure Name: Intubation Date/Time: 04/03/2017 10:22 AM Performed by: Ollen Bowl, CRNA Pre-anesthesia Checklist: Patient identified, Patient being monitored, Timeout performed, Emergency Drugs available and Suction available Patient Re-evaluated:Patient Re-evaluated prior to induction Oxygen Delivery Method: Circle system utilized Preoxygenation: Pre-oxygenation with 100% oxygen Induction Type: IV induction Ventilation: Mask ventilation without difficulty Laryngoscope Size: Mac and 3 Grade View: Grade I Tube type: Oral Tube size: 7.0 mm Number of attempts: 1 Airway Equipment and Method: Stylet Placement Confirmation: ETT inserted through vocal cords under direct vision,  positive ETCO2 and breath sounds checked- equal and bilateral Secured at: 21 cm Tube secured with: Tape Dental Injury: Teeth and Oropharynx as per pre-operative assessment

## 2017-04-04 ENCOUNTER — Encounter (HOSPITAL_COMMUNITY): Payer: Self-pay | Admitting: Orthopedic Surgery

## 2017-04-07 NOTE — Op Note (Signed)
04/03/2017  11:18 AM  PATIENT:  John Mote Sr.  61 y.o. male  PRE-OPERATIVE DIAGNOSIS:  TORN MEDIAL MENISCUS RIGHT KNEE  [id number]  POST-OPERATIVE DIAGNOSIS:  Same   KNEE ARTHROSCOPY WITH MEDIAL MENISECTOMY right  SURGEON:  Surgeon(s) and Role:    Carole Civil, MD - Primary  Operative findings  Extensive hemorrhagic synovitis throughout the joint  Torn medial meniscus right knee.  Medial femoral condyle chondromalacia and tibial plateau chondromalacia diffuse grade 2  Patellofemoral chondromalacia grade 2 on the patella grade 2 diffuse on the trochlea  Lateral meniscus normal lateral femoral condyle normal lateral tibial plateau grade I chondromalacia  Notch ACL PCL normal  PHYSICIAN ASSISTANT:   ASSISTANTS: none   ANESTHESIA:   none  EBL:  Total I/O In: 700 [I.V.:700] Out: 0   BLOOD ADMINISTERED:none  DRAINS: none   LOCAL MEDICATIONS USED:  Marcaine with epi 60 cc  SPECIMEN:  No Specimen  DISPOSITION OF SPECIMEN:  N/A  COUNTS:  YES  TOURNIQUET:    DICTATION: .Dragon Dictation  Knee arthroscopy dictation  The patient was identified in the preoperative holding area using 2 approved identification mechanisms. The chart was reviewed and updated. The surgical site was confirmed as right knee and marked.  The patient was taken to the operating room for anesthesia. After successful general anesthesia, Ancef 2 g was used as IV antibiotics.  The patient was placed in the supine position with the right the operative extremity in an arthroscopic leg holder and the opposite extremity in a padded leg holder.  The timeout was executed.  A lateral portal was established with an 11 blade and the scope was introduced into the joint. A diagnostic arthroscopy was performed in circumferential manner examining the entire knee joint. A medial portal was established and the diagnostic arthroscopy was repeated using a probe to palpate  intra-articular structures as they were encountered.   The operative findings are   Extensive hemorrhagic synovitis throughout the joint  Torn medial meniscus right knee.  Medial femoral condyle chondromalacia and tibial plateau chondromalacia diffuse grade 2  Patellofemoral chondromalacia grade 2 on the patella grade 2 diffuse on the trochlea  Lateral meniscus normal lateral femoral condyle normal lateral tibial plateau grade I chondromalacia  Notch ACL PCL normal   The right meniscus was resected using a duckbill forceps. The meniscal fragments were removed with a motorized shaver. The meniscus was balanced with a combination of a motorized shaver and a 50  wand until a stable rim was obtained.  A limited sign of ectomy was performed with the ArthroCare wand.  I used it to cauterize the extensive synovial irritation of the joint  The arthroscopic pump was placed on the wash mode and any excess debris was removed from the joint using suction.  60 cc of Marcaine with epinephrine was injected through the arthroscope.  The portals were closed with 3-0 nylon suture.  A sterile bandage, Ace wrap and Cryo/Cuff was placed and the Cryo/Cuff was activated. The patient was taken to the recovery room in stable condition.   PLAN OF CARE: Discharge to home after PACU  PATIENT DISPOSITION:  PACU - hemodynamically stable.   Delay start of Pharmacological VTE agent (>24hrs) due to surgical blood loss or risk of bleeding: not applicable

## 2017-04-08 ENCOUNTER — Other Ambulatory Visit: Payer: Self-pay | Admitting: Orthopedic Surgery

## 2017-04-08 DIAGNOSIS — Z9889 Other specified postprocedural states: Secondary | ICD-10-CM | POA: Insufficient documentation

## 2017-04-09 ENCOUNTER — Ambulatory Visit (INDEPENDENT_AMBULATORY_CARE_PROVIDER_SITE_OTHER): Payer: Self-pay | Admitting: Orthopedic Surgery

## 2017-04-09 ENCOUNTER — Encounter: Payer: Self-pay | Admitting: Orthopedic Surgery

## 2017-04-09 VITALS — BP 145/83 | HR 80 | Ht 67.0 in | Wt 205.0 lb

## 2017-04-09 DIAGNOSIS — Z4889 Encounter for other specified surgical aftercare: Secondary | ICD-10-CM

## 2017-04-09 DIAGNOSIS — Z9889 Other specified postprocedural states: Secondary | ICD-10-CM

## 2017-04-09 MED ORDER — HYDROCODONE-ACETAMINOPHEN 10-325 MG PO TABS: 1.0000 | ORAL_TABLET | ORAL | 0 refills | Status: DC | PRN

## 2017-04-09 NOTE — Progress Notes (Signed)
Chief Complaint  Patient presents with  . Post-op Follow-up    04/03/17 knee scope right     Encounter Diagnosis  Name Primary?  . S/P right knee arthroscopy 04/03/17     He did have a increased amount of pain and swelling today he says it is related to walking is not as bad if he does not do as much  Portals are clean dry and intact stitches were removed  Knee comes to full extension flexes about 90 degrees  Recommend 3 sets of 10 quadriceps exercises ankle pumps to prevent any DVT and come back in 3 weeks for recheck   PRE-OPERATIVE DIAGNOSIS:  TORN MEDIAL MENISCUS RIGHT KNEE  [id number]   POST-OPERATIVE DIAGNOSIS:  Same    KNEE ARTHROSCOPY WITH MEDIAL MENISECTOMY right  SURGEON:  Surgeon(s) and Role:    Carole Civil, MD - Primary   Operative findings   Extensive hemorrhagic synovitis throughout the joint   Torn medial meniscus right knee.  Medial femoral condyle chondromalacia and tibial plateau chondromalacia diffuse grade 2   Patellofemoral chondromalacia grade 2 on the patella grade 2 diffuse on the trochlea   Lateral meniscus normal lateral femoral condyle normal lateral tibial plateau grade I chondromalacia

## 2017-04-15 ENCOUNTER — Other Ambulatory Visit: Payer: Self-pay | Admitting: Orthopedic Surgery

## 2017-04-15 DIAGNOSIS — Z4889 Encounter for other specified surgical aftercare: Secondary | ICD-10-CM

## 2017-04-15 DIAGNOSIS — Z9889 Other specified postprocedural states: Secondary | ICD-10-CM

## 2017-04-15 MED ORDER — HYDROCODONE-ACETAMINOPHEN 7.5-325 MG PO TABS: 1.0000 | ORAL_TABLET | Freq: Four times a day (QID) | ORAL | 0 refills | Status: DC | PRN

## 2017-04-15 NOTE — Telephone Encounter (Signed)
04/09/17 last prescribed  S/p knee arthroscopy 04/03/17

## 2017-04-22 ENCOUNTER — Other Ambulatory Visit: Payer: Self-pay | Admitting: Orthopedic Surgery

## 2017-04-22 DIAGNOSIS — Z4889 Encounter for other specified surgical aftercare: Secondary | ICD-10-CM

## 2017-04-22 NOTE — Telephone Encounter (Signed)
Dr. Aline Brochure just did surgery on him.  Please send to Dr. Lemmie Evens.

## 2017-04-23 MED ORDER — HYDROCODONE-ACETAMINOPHEN 7.5-325 MG PO TABS: 1.0000 | ORAL_TABLET | Freq: Four times a day (QID) | ORAL | 0 refills | Status: DC | PRN

## 2017-04-29 ENCOUNTER — Other Ambulatory Visit: Payer: Self-pay | Admitting: Orthopedic Surgery

## 2017-04-29 DIAGNOSIS — Z4889 Encounter for other specified surgical aftercare: Secondary | ICD-10-CM

## 2017-04-30 ENCOUNTER — Encounter: Payer: Self-pay | Admitting: Orthopedic Surgery

## 2017-04-30 ENCOUNTER — Ambulatory Visit (INDEPENDENT_AMBULATORY_CARE_PROVIDER_SITE_OTHER): Payer: Medicare HMO | Admitting: Orthopedic Surgery

## 2017-04-30 VITALS — BP 162/82 | HR 87 | Ht 67.0 in | Wt 205.0 lb

## 2017-04-30 DIAGNOSIS — Z9889 Other specified postprocedural states: Secondary | ICD-10-CM

## 2017-04-30 MED ORDER — HYDROCODONE-ACETAMINOPHEN 5-325 MG PO TABS: 1.0000 | ORAL_TABLET | Freq: Four times a day (QID) | ORAL | 0 refills | Status: DC | PRN

## 2017-04-30 NOTE — Telephone Encounter (Signed)
Pick up today in office

## 2017-04-30 NOTE — Telephone Encounter (Signed)
04/03/17 knee scope  Last written 04/09/17 #42  Ok to refill Hydrocodone?

## 2017-04-30 NOTE — Progress Notes (Signed)
Chief Complaint  Patient presents with  . Routine Post Op    S/P RIGHT KNEE ARTHROSCOPY 04/03/17   PRE-OPERATIVE DIAGNOSIS:  TORN MEDIAL MENISCUS RIGHT KNEE  [id number]   POST-OPERATIVE DIAGNOSIS:  Same    KNEE ARTHROSCOPY WITH MEDIAL MENISECTOMY right  SURGEON:  Surgeon(s) and Role:    Carole Civil, MD - Primary   Operative findings   Extensive hemorrhagic synovitis throughout the joint   Torn medial meniscus right knee.  Medial femoral condyle chondromalacia and tibial plateau chondromalacia diffuse grade 2   Patellofemoral chondromalacia grade 2 on the patella grade 2 diffuse on the trochlea   Lateral meniscus normal lateral femoral condyle normal lateral tibial plateau grade I chondromalacia   S painful ambulation during the snow and pain with the cold weather.  O no effusion.  Knee flexion 120 degrees full extension gait unsupported no limping  A status post arthroscopy right knee partial medial meniscectomy with underlying arthritis  P recommend continue home exercises.  We sent a prescription in for pain medication with a reduced dose

## 2017-05-06 ENCOUNTER — Other Ambulatory Visit: Payer: Self-pay | Admitting: Orthopedic Surgery

## 2017-05-06 DIAGNOSIS — Z9889 Other specified postprocedural states: Secondary | ICD-10-CM

## 2017-05-06 NOTE — Telephone Encounter (Signed)
Last written 04/30/17  Surgery 04/03/17

## 2017-05-07 MED ORDER — HYDROCODONE-ACETAMINOPHEN 5-325 MG PO TABS: 1.0000 | ORAL_TABLET | Freq: Four times a day (QID) | ORAL | 0 refills | Status: DC | PRN

## 2017-05-07 NOTE — Telephone Encounter (Signed)
I spoke to patient, he states when he was in the office, Dr Luna Glasgow told him he would take over the Hydrocodone when Dr Aline Brochure finishes treating him for surgery, Dr Aline Brochure has released patient.    I have told him he may need to be seen in the office. He states he has appt on Jan 18th with Dr Luna Glasgow, told me to send message to Dr Luna Glasgow, and Dr Luna Glasgow would write RX  To you for further instructions.

## 2017-05-07 NOTE — Telephone Encounter (Signed)
Relayed this information to patient. States he does not understand.  Thought Dr Luna Glasgow was to take care of it.  I relayed, due to most recent care under Dr Aline Brochure, Dr Aline Brochure reviewed and advised.  Needs to speak with nurse.

## 2017-06-05 ENCOUNTER — Ambulatory Visit: Payer: Medicare HMO | Admitting: Orthopaedic Surgery

## 2017-06-05 ENCOUNTER — Encounter: Payer: Self-pay | Admitting: Orthopaedic Surgery

## 2017-06-05 VITALS — BP 176/99 | HR 84 | Ht 67.0 in | Wt 205.0 lb

## 2017-06-05 DIAGNOSIS — M25561 Pain in right knee: Secondary | ICD-10-CM | POA: Diagnosis not present

## 2017-06-05 DIAGNOSIS — M25512 Pain in left shoulder: Secondary | ICD-10-CM | POA: Diagnosis not present

## 2017-06-05 DIAGNOSIS — M25511 Pain in right shoulder: Secondary | ICD-10-CM

## 2017-06-05 DIAGNOSIS — G8929 Other chronic pain: Secondary | ICD-10-CM | POA: Diagnosis not present

## 2017-06-05 MED ORDER — HYDROCODONE-ACETAMINOPHEN 7.5-325 MG PO TABS: 1.0000 | ORAL_TABLET | Freq: Four times a day (QID) | ORAL | 0 refills | Status: DC | PRN

## 2017-06-05 NOTE — Progress Notes (Signed)
Patient John L [name], male DOB:01-26-1956, 62 y.o. OEU:[id number]  Chief Complaint  Patient presents with  . Follow-up    Bilateral shoulders    HPI  John Bridges. is a 62 y.o. male who has bilateral shoulder pain.  The right shoulder is somewhat tender but the left shoulder hurts.  He has pain with overhead use. He has no new trauma, no paresthesias.  He had surgery recently by Dr. Aline Brochure on the right knee.  It is hurting today and has effusion, he has no trauma, no giving way. HPI  Body mass index is 32.11 kg/m.  ROS  Review of Systems  Constitutional:       Patient does not have Diabetes Mellitus. Patient has hypertension. Patient has COPD or shortness of breath. Patient does not have BMI > 35. Patient has current smoking history.  HENT: Negative for congestion.   Respiratory: Positive for shortness of breath. Negative for cough.   Cardiovascular: Negative for chest pain and leg swelling.  Endocrine: Negative for cold intolerance.  Musculoskeletal: Positive for arthralgias, gait problem, joint swelling and myalgias.  Allergic/Immunologic: Negative for environmental allergies.  All other systems reviewed and are negative.   Past Medical History:  Diagnosis Date  . Arthritis   . COPD (chronic obstructive pulmonary disease) ([place])   . Depression   . Essential hypertension   . History of bronchitis   . Hyperlipidemia   . Lung nodule    Right upper lobe lung nodule 1.3 cm   . Shortness of breath dyspnea     Past Surgical History:  Procedure Laterality Date  . COLONOSCOPY    . KNEE ARTHROSCOPY WITH MEDIAL MENISECTOMY Right 04/03/2017   Procedure: KNEE ARTHROSCOPY WITH MEDIAL MENISECTOMY;  Surgeon: Carole Civil, MD;  Location: [facility];  Service: Orthopedics;  Laterality: Right;  . LEG SURGERY Left    plate inserted and removed.  . THORACOTOMY Right 09/25/2015   Procedure: MINI/LIMITED THORACOTOMY WITH RIGHT UPPER LOBE WEDGE RESECTION;  Surgeon:  Grace Isaac, MD;  Location: [place];  Service: Thoracic;  Laterality: Right;  . TOOTH EXTRACTION    . VIDEO BRONCHOSCOPY N/A 09/25/2015   Procedure: VIDEO BRONCHOSCOPY;  Surgeon: Grace Isaac, MD;  [facility];  Service: Thoracic;  Laterality: N/A;    Family History  Problem Relation Age of Onset  . Hypertension Mother   . Hypertension Father   . Cancer Brother   . CAD Brother   . Stroke Brother     Social History Social History   Tobacco Use  . Smoking status: Former Smoker    Packs/day: 1.00    Years: 40.00    Pack years: 40.00    Types: Cigarettes    Last attempt to quit: 08/16/2015    Years since quitting: 1.8  . Smokeless tobacco: Former Network engineer Use Topics  . Alcohol use: Yes    Alcohol/week: 0.0 oz    Comment: 3-4 times a week  . Drug use: No    No Known Allergies  Current Outpatient Medications  Medication Sig Dispense Refill  . albuterol (PROVENTIL HFA;VENTOLIN HFA) 108 (90 Base) MCG/ACT inhaler Inhale 2 puffs every 6 (six) hours as needed into the lungs for wheezing or shortness of breath.    . ALPRAZolam (XANAX) 0.5 MG tablet Take 0.5 mg at bedtime as needed by mouth for anxiety or sleep.     Marland Kitchen amLODipine (NORVASC) 10 MG tablet Take 10 mg by mouth daily.    Marland Kitchen  atorvastatin (LIPITOR) 40 MG tablet     . HYDROcodone-acetaminophen (NORCO) 7.5-325 MG tablet Take 1 tablet by mouth every 6 (six) hours as needed for moderate pain (Must last 30 days.  Do not drive a call or operate machinery while on this medicine.). 100 tablet 0  . lisinopril-hydrochlorothiazide (PRINZIDE,ZESTORETIC) 20-12.5 MG per tablet Take 1 tablet by mouth daily.    . Omega-3 Fatty Acids (FISH OIL PO) Take 1 capsule daily by mouth.    . ondansetron (ZOFRAN) 4 MG tablet Take 1 tablet (4 mg total) by mouth as needed for nausea or vomiting. 10 tablet 0  . pantoprazole (PROTONIX) 40 MG tablet Take 1 tablet (40 mg total) by mouth daily. 30 tablet 0  . pravastatin (PRAVACHOL) 40 MG  tablet Take 40 mg by mouth daily.    Marland Kitchen triamcinolone cream (KENALOG) 0.1 % Apply 1 application daily as needed topically (for rash on feet).      No current facility-administered medications for this visit.      Physical Exam  Blood pressure (!) 176/99, pulse 84, height 5\' 7"  (1.702 m), weight 205 lb (93 kg).  Constitutional: overall normal hygiene, normal nutrition, well developed, normal grooming, normal body habitus. Assistive device:none  Musculoskeletal: gait and station Limp right, muscle tone and strength are normal, no tremors or atrophy is present.  .  Neurological: coordination overall normal.  Deep tendon reflex/nerve stretch intact.  Sensation normal.  Cranial nerves II-XII intact.   Skin:   Normal overall no scars, lesions, ulcers or rashes. No psoriasis.  Psychiatric: Alert and oriented x 3.  Recent memory intact, remote memory unclear.  Normal mood and affect. Well groomed.  Good eye contact.  Cardiovascular: overall no swelling, no varicosities, no edema bilaterally, normal temperatures of the legs and arms, no clubbing, cyanosis and good capillary refill.  Lymphatic: palpation is normal.  Examination of left Upper Extremity is done.  Inspection:   Overall:  Elbow non-tender without crepitus or defects, forearm non-tender without crepitus or defects, wrist non-tender without crepitus or defects, hand non-tender.    Shoulder: with glenohumeral joint tenderness, without effusion.   Upper arm: without swelling and tenderness   Range of motion:   Overall:  Full range of motion of the elbow, full range of motion of wrist and full range of motion in fingers.   Shoulder:  left  165 degrees forward flexion; 150 degrees abduction; 35 degrees internal rotation, 35 degrees external rotation, 15 degrees extension, 40 degrees adduction.   Stability:   Overall:  Shoulder, elbow and wrist stable   Strength and Tone:   Overall full shoulder muscles strength, full upper arm  strength and normal upper arm bulk and tone.  The right lower extremity is examined:  Inspection:  Thigh:  Non-tender and no defects  Knee has swelling 1+ effusion.                        Joint tenderness is present                        Patient is tender over the medial joint line  Lower Leg:  Has normal appearance and no tenderness or defects  Ankle:  Non-tender and no defects  Foot:  Non-tender and no defects Range of Motion:  Knee:  Range of motion is: 0-110  Crepitus is  present  Ankle:  Range of motion is normal. Strength and Tone:  The right lower extremity has normal strength and tone. Stability:  Knee:  The knee is stable.  Ankle:  The ankle is stable.   All other systems reviewed and are negative   The patient has been educated about the nature of the problem(s) and counseled on treatment options.  The patient appeared to understand what I have discussed and is in agreement with it.  Encounter Diagnoses  Name Primary?  . Chronic right shoulder pain Yes  . Chronic left shoulder pain   . Chronic pain of right knee     PLAN Call if any problems.  Precautions discussed.  Continue current medications.   Return to clinic 1 month   I have reviewed the [facility] web site prior to prescribing narcotic medicine for this patient.  Electronically Signed Sanjuana Kava, MD 1/17/20191:47 PM

## 2017-07-02 ENCOUNTER — Other Ambulatory Visit: Payer: Self-pay | Admitting: Orthopaedic Surgery

## 2017-07-02 MED ORDER — HYDROCODONE-ACETAMINOPHEN 7.5-325 MG PO TABS: 1.0000 | ORAL_TABLET | Freq: Four times a day (QID) | ORAL | 0 refills | Status: DC | PRN

## 2017-07-07 ENCOUNTER — Ambulatory Visit: Payer: Medicare HMO | Admitting: Orthopedic Surgery

## 2017-07-27 ENCOUNTER — Other Ambulatory Visit: Payer: Self-pay | Admitting: Orthopaedic Surgery

## 2017-07-28 MED ORDER — HYDROCODONE-ACETAMINOPHEN 7.5-325 MG PO TABS: 1.0000 | ORAL_TABLET | Freq: Four times a day (QID) | ORAL | 0 refills | Status: DC | PRN

## 2017-07-30 ENCOUNTER — Ambulatory Visit: Payer: Medicare HMO | Admitting: Orthopedic Surgery

## 2017-07-30 ENCOUNTER — Ambulatory Visit (INDEPENDENT_AMBULATORY_CARE_PROVIDER_SITE_OTHER): Payer: Medicare HMO

## 2017-07-30 ENCOUNTER — Encounter: Payer: Self-pay | Admitting: Orthopedic Surgery

## 2017-07-30 VITALS — BP 139/64 | HR 100 | Ht 67.0 in | Wt 216.0 lb

## 2017-07-30 DIAGNOSIS — M1711 Unilateral primary osteoarthritis, right knee: Secondary | ICD-10-CM

## 2017-07-30 DIAGNOSIS — Z9889 Other specified postprocedural states: Secondary | ICD-10-CM

## 2017-07-30 NOTE — Addendum Note (Signed)
Addended byCandice Camp on: 07/30/2017 11:24 AM   Modules accepted: Orders

## 2017-07-30 NOTE — Progress Notes (Addendum)
Progress Note   Patient ID: John Bridges., male   DOB: Dec 02, 1955, 62 y.o.   MRN: [id number]  Chief Complaint  Patient presents with  . Knee Pain    medial knee pain has gotten worse past few days s/p knee arthroscopy 04/03/17    62 year old male status post knee arthroscopy in November had medial meniscectomy with primary osteoarthritis comes in complaining of worsening  dull aching right pain knee pain over the last 2-3 weeks associated with catching especially when pivoting on the right knee, he also complains of swelling and severe pain     Review of Systems  Constitutional: Negative.   Respiratory: Negative.   Cardiovascular: Negative.    No outpatient medications have been marked as taking for the 07/30/17 encounter (Office Visit) with Carole Civil, MD.    Past Medical History:  Diagnosis Date  . Arthritis   . COPD (chronic obstructive pulmonary disease) ([place])   . Depression   . Essential hypertension   . History of bronchitis   . Hyperlipidemia   . Lung nodule    Right upper lobe lung nodule 1.3 cm   . Shortness of breath dyspnea      No Known Allergies  BP 139/64   Pulse 100   Ht 5\' 7"  (1.702 m)   Wt 216 lb (98 kg)   BMI 33.83 kg/m    Physical Exam  Constitutional: He is oriented to person, place, and time. He appears well-developed and well-nourished.  Vital signs have been reviewed and are stable. Gen. appearance the patient is well-developed and well-nourished with normal grooming and hygiene.   Musculoskeletal:       Right knee: He exhibits effusion.       Left knee: He exhibits no effusion.  He does have antalgic gait favoring the right knee has decreased terminal knee extension in stance phase  Neurological: He is alert and oriented to person, place, and time.  Skin: Skin is warm and dry. No erythema.  Psychiatric: He has a normal mood and affect.  Vitals reviewed.   Right Knee Exam   Muscle Strength  The patient has normal right  knee strength.  Tenderness  The patient is experiencing tenderness in the medial joint line (Tenderness is over the medial femoral condyle posterior medial joint line).  Range of Motion  Extension: 5  Flexion: 120   Tests  [place]:  Medial - negative  Varus: negative Valgus: negative Drawer:  Anterior - negative    Posterior - negative  Other  Erythema: absent Scars: absent Sensation: normal Pulse: present Swelling: none Effusion: effusion present   Left Knee Exam   Muscle Strength  The patient has normal left knee strength.  Tests  Drawer:  Anterior - negative     Posterior - negative  Other  Erythema: absent Sensation: normal Pulse: present Swelling: none Effusion: no effusion present       Medical decision-making  Imaging:   New images were obtained  See separate report  Worsening arthritis of the right knee   Encounter Diagnoses  Name Primary?  . S/P right knee arthroscopy 04/03/17   . Primary osteoarthritis of right knee Yes   I aspirated 35 cc of yellow fluid there was some cartilage particles noted in that  We injected knee with cortisone we are going to consider him for a osteoarthritis brace if eligible  Follow-up as needed  Procedure note injection and aspiration right knee joint  Verbal consent was obtained to aspirate  and inject the right knee joint   Timeout was completed to confirm the site of aspiration and injection  An 18-gauge needle was used to aspirate the knee joint from a suprapatellar lateral approach.  The medications used were 40 mg of Depo-Medrol and 1% lidocaine 3 cc  Anesthesia was provided by ethyl chloride and the skin was prepped with alcohol.  After cleaning the skin with alcohol an 18-gauge needle was used to aspirate the right knee joint.  We obtained 35  cc of fluid  We follow this by injection of 40 mg of Depo-Medrol and 3 cc 1% lidocaine.  There were no complications. A sterile bandage was  applied.    No orders of the defined types were placed in this encounter.    Arther Abbott, MD 07/30/2017 9:59 AM    Addendum: The patient is 62 years old and needs a custom brace his thigh and calf circumference did not support off-the-shelf bracing due to mismatch

## 2017-08-25 ENCOUNTER — Other Ambulatory Visit: Payer: Self-pay | Admitting: Orthopedic Surgery

## 2017-08-25 MED ORDER — HYDROCODONE-ACETAMINOPHEN 7.5-325 MG PO TABS: 1.0000 | ORAL_TABLET | Freq: Four times a day (QID) | ORAL | 0 refills | Status: DC | PRN

## 2017-09-18 ENCOUNTER — Ambulatory Visit: Payer: Medicare HMO | Admitting: Internal Medicine

## 2017-09-18 ENCOUNTER — Telehealth: Payer: Self-pay | Admitting: Radiology

## 2017-09-18 NOTE — Telephone Encounter (Signed)
You ordered a custom knee brace for patient. John Bridges from [facility] needs you to create addendum to last office note for reasoning to support custom knee brace. Can you please dictate?   His thigh circumference was disproportionate to the calf circumference * can I have a spelling award please?

## 2017-09-19 NOTE — Telephone Encounter (Signed)
Completed.

## 2017-09-22 ENCOUNTER — Other Ambulatory Visit: Payer: Self-pay | Admitting: Orthopedic Surgery

## 2017-09-23 MED ORDER — HYDROCODONE-ACETAMINOPHEN 7.5-325 MG PO TABS: 1.0000 | ORAL_TABLET | Freq: Four times a day (QID) | ORAL | 0 refills | Status: DC | PRN

## 2017-09-25 ENCOUNTER — Emergency Department (HOSPITAL_COMMUNITY)
Admission: EM | Admit: 2017-09-25 | Discharge: 2017-09-25 | Disposition: A | Discharge status: Home / Self Care | Payer: Medicare HMO | Attending: Emergency Medicine | Admitting: Emergency Medicine

## 2017-09-25 ENCOUNTER — Other Ambulatory Visit: Payer: Self-pay

## 2017-09-25 ENCOUNTER — Encounter (HOSPITAL_COMMUNITY): Payer: Self-pay | Admitting: Emergency Medicine

## 2017-09-25 DIAGNOSIS — I10 Essential (primary) hypertension: Secondary | ICD-10-CM | POA: Insufficient documentation

## 2017-09-25 DIAGNOSIS — S40022A Contusion of left upper arm, initial encounter: Secondary | ICD-10-CM | POA: Diagnosis not present

## 2017-09-25 DIAGNOSIS — J449 Chronic obstructive pulmonary disease, unspecified: Secondary | ICD-10-CM | POA: Insufficient documentation

## 2017-09-25 DIAGNOSIS — X58XXXA Exposure to other specified factors, initial encounter: Secondary | ICD-10-CM | POA: Insufficient documentation

## 2017-09-25 DIAGNOSIS — S4992XA Unspecified injury of left shoulder and upper arm, initial encounter: Secondary | ICD-10-CM | POA: Diagnosis present

## 2017-09-25 DIAGNOSIS — Y939 Activity, unspecified: Secondary | ICD-10-CM | POA: Diagnosis not present

## 2017-09-25 DIAGNOSIS — Z79899 Other long term (current) drug therapy: Secondary | ICD-10-CM | POA: Diagnosis not present

## 2017-09-25 DIAGNOSIS — Y999 Unspecified external cause status: Secondary | ICD-10-CM | POA: Insufficient documentation

## 2017-09-25 DIAGNOSIS — Y929 Unspecified place or not applicable: Secondary | ICD-10-CM | POA: Insufficient documentation

## 2017-09-25 DIAGNOSIS — Z87891 Personal history of nicotine dependence: Secondary | ICD-10-CM | POA: Insufficient documentation

## 2017-09-25 HISTORY — DX: Personal history of other diseases of the digestive system: Z87.19

## 2017-09-25 LAB — CBC WITH DIFFERENTIAL/PLATELET
Basophils Absolute: 0 10*3/uL (ref 0.0–0.1)
Basophils Relative: 0 %
Eosinophils Absolute: 0.3 10*3/uL (ref 0.0–0.7)
Eosinophils Relative: 3 %
HCT: 44.1 % (ref 39.0–52.0)
Hemoglobin: 14.7 g/dL (ref 13.0–17.0)
Lymphocytes Relative: 23 %
Lymphs Abs: 2.3 10*3/uL (ref 0.7–4.0)
MCH: 30.9 pg (ref 26.0–34.0)
MCHC: 33.3 g/dL (ref 30.0–36.0)
MCV: 92.8 fL (ref 78.0–100.0)
Monocytes Absolute: 0.9 10*3/uL (ref 0.1–1.0)
Monocytes Relative: 9 %
Neutro Abs: 6.8 10*3/uL (ref 1.7–7.7)
Neutrophils Relative %: 65 %
Platelets: 305 10*3/uL (ref 150–400)
RBC: 4.75 MIL/uL (ref 4.22–5.81)
RDW: 12.4 % (ref 11.5–15.5)
WBC: 10.4 10*3/uL (ref 4.0–10.5)

## 2017-09-25 LAB — COMPREHENSIVE METABOLIC PANEL
ALT: 22 U/L (ref 17–63)
AST: 25 U/L (ref 15–41)
Albumin: 4.2 g/dL (ref 3.5–5.0)
Alkaline Phosphatase: 77 U/L (ref 38–126)
Anion gap: 10 (ref 5–15)
BUN: 12 mg/dL (ref 6–20)
CO2: 28 mmol/L (ref 22–32)
Calcium: 9.6 mg/dL (ref 8.9–10.3)
Chloride: 98 mmol/L — ABNORMAL LOW (ref 101–111)
Creatinine, Ser: 0.82 mg/dL (ref 0.61–1.24)
GFR calc Af Amer: >60 mL/min (ref >60)
GFR calc non Af Amer: >60 mL/min (ref >60)
Glucose, Bld: 106 mg/dL — ABNORMAL HIGH (ref 65–99)
Potassium: 4 mmol/L (ref 3.5–5.1)
Sodium: 136 mmol/L (ref 135–145)
Total Bilirubin: 0.7 mg/dL (ref 0.3–1.2)
Total Protein: 7.9 g/dL (ref 6.5–8.1)

## 2017-09-25 NOTE — Discharge Instructions (Addendum)
Return if any problems.

## 2017-09-25 NOTE — ED Triage Notes (Signed)
Pt c/o of left arm pain with bruising.  Pt denies any new injuries to left arm. Pt denies being on blood thinners.

## 2017-09-25 NOTE — ED Provider Notes (Signed)
[facility] EMERGENCY DEPARTMENT Provider Note   CSN: [id number] Arrival date & time: 09/25/17  1516     History   Chief Complaint Chief Complaint  Patient presents with  . Arm Pain    HPI John HILLER Sr. is a 62 y.o. male.  The history is provided by the patient. No language interpreter was used.  Arm Pain  This is a new problem. The current episode started more than 2 days ago. The problem has not changed since onset.Nothing aggravates the symptoms. Nothing relieves the symptoms. He has tried nothing for the symptoms. The treatment provided no relief.  Pt complains of bruising to his upper left arm.  Pt denies any injury.    Past Medical History:  Diagnosis Date  . Arthritis   . COPD (chronic obstructive pulmonary disease) ([place])   . Depression   . Essential hypertension   . H/O gastroesophageal reflux (GERD)   . History of bronchitis   . Hyperlipidemia   . Lung nodule    Right upper lobe lung nodule 1.3 cm   . Shortness of breath dyspnea     Patient Active Problem List   Diagnosis Date Noted  . S/P right knee arthroscopy 04/03/17 04/08/2017  . Degenerative tear of medial meniscus of right knee   . Nodule of right lung 09/25/2015  . Lung nodule 08/21/2015  . COPD (chronic obstructive pulmonary disease) ([place]) 08/21/2015  . Need for prophylactic vaccination and inoculation against influenza 07/17/2015  . Smoking greater than 40 pack years 07/17/2015  . Cancer screening 07/17/2015    Past Surgical History:  Procedure Laterality Date  . COLONOSCOPY    . KNEE ARTHROSCOPY WITH MEDIAL MENISECTOMY Right 04/03/2017   Procedure: KNEE ARTHROSCOPY WITH MEDIAL MENISECTOMY;  Surgeon: Carole Civil, MD;  Location: [facility];  Service: Orthopedics;  Laterality: Right;  . LEG SURGERY Left    plate inserted and removed.  . THORACOTOMY Right 09/25/2015   Procedure: MINI/LIMITED THORACOTOMY WITH RIGHT UPPER LOBE WEDGE RESECTION;  Surgeon: Grace Isaac, MD;  Location: [place];  Service: Thoracic;  Laterality: Right;  . TOOTH EXTRACTION    . VIDEO BRONCHOSCOPY N/A 09/25/2015   Procedure: VIDEO BRONCHOSCOPY;  Surgeon: Grace Isaac, MD;  Location: [facility] OR;  Service: Thoracic;  Laterality: N/A;        Home Medications    Prior to Admission medications   Medication Sig Start Date End Date Taking? Authorizing Provider  albuterol (PROVENTIL HFA;VENTOLIN HFA) 108 (90 Base) MCG/ACT inhaler Inhale 2 puffs every 6 (six) hours as needed into the lungs for wheezing or shortness of breath.    [provider]  ALPRAZolam Duanne Moron) 0.5 MG tablet Take 0.5 mg at bedtime as needed by mouth for anxiety or sleep.  02/24/17   [provider]  amLODipine (NORVASC) 10 MG tablet Take 10 mg by mouth daily.    [provider]  atorvastatin (LIPITOR) 40 MG tablet  03/17/17   [provider]  HYDROcodone-acetaminophen (NORCO) 7.5-325 MG tablet Take 1 tablet by mouth every 6 (six) hours as needed for moderate pain (Must last 30 days). 09/23/17   Carole Civil, MD  lisinopril-hydrochlorothiazide (PRINZIDE,ZESTORETIC) 20-12.5 MG per tablet Take 1 tablet by mouth daily.    [provider]  Omega-3 Fatty Acids (FISH OIL PO) Take 1 capsule daily by mouth.    [provider]  ondansetron (ZOFRAN) 4 MG tablet Take 1 tablet (4 mg total) by mouth as needed for nausea or  vomiting. 11/24/15   Brand Males, MD  pantoprazole (PROTONIX) 40 MG tablet Take 1 tablet (40 mg total) by mouth daily. 09/11/13   Milton Ferguson, MD  pravastatin (PRAVACHOL) 40 MG tablet Take 40 mg by mouth daily.    [provider]  triamcinolone cream (KENALOG) 0.1 % Apply 1 application daily as needed topically (for rash on feet).     [provider]    Family History Family History  Problem Relation Age of Onset  . Hypertension Mother   . Hypertension Father   . Cancer Brother   . CAD Brother   . Stroke Brother     Social History Social  History   Tobacco Use  . Smoking status: Former Smoker    Packs/day: 1.00    Years: 40.00    Pack years: 40.00    Types: Cigarettes    Last attempt to quit: 08/16/2015    Years since quitting: 2.1  . Smokeless tobacco: Former Network engineer Use Topics  . Alcohol use: Yes    Alcohol/week: 0.0 oz    Comment: 3-4 times a week  . Drug use: No     Allergies   Patient has no known allergies.   Review of Systems Review of Systems  Skin: Positive for color change.  All other systems reviewed and are negative.    Physical Exam Updated Vital Signs BP (!) 174/96 (BP Location: Right Arm)   Pulse 89   Temp 97.9 F (36.6 C) (Oral)   Resp 18   Ht 5\' 10"  (1.778 m)   Wt 83.9 kg (185 lb)   SpO2 96%   BMI 26.54 kg/m   Physical Exam  Constitutional: He appears well-developed and well-nourished.  Musculoskeletal: Normal range of motion.  Neurological: He is alert.  Skin:  Bruised purple and yellow.  Center appears to be clearing.    Nursing note and vitals reviewed.    ED Treatments / Results  Labs (all labs ordered are listed, but only abnormal results are displayed) Labs Reviewed  COMPREHENSIVE METABOLIC PANEL - Abnormal; Notable for the following components:      Result Value   Chloride 98 (*)    Glucose, Bld 106 (*)    All other components within normal limits  CBC WITH DIFFERENTIAL/PLATELET    EKG None  Radiology No results found.  Procedures Procedures (including critical care time)  Medications Ordered in ED Medications - No data to display   Initial Impression / Assessment and Plan / ED Course  I have reviewed the triage vital signs and the nursing notes.  Pertinent labs & imaging results that were available during my care of the patient were reviewed by me and considered in my medical decision making (see chart for details).     MDM  Labs obtained and reviewed, normal platelets.    Final Clinical Impressions(s) / ED Diagnoses   Final  diagnoses:  Contusion of left upper arm, initial encounter    ED Discharge Orders    None       Sidney Ace 09/25/17 2238    Davonna Belling, MD 09/26/17 [id number]

## 2017-10-02 ENCOUNTER — Ambulatory Visit: Payer: Medicare HMO | Admitting: Orthopaedic Surgery

## 2017-10-02 ENCOUNTER — Encounter: Payer: Self-pay | Admitting: Orthopaedic Surgery

## 2017-10-02 ENCOUNTER — Ambulatory Visit (INDEPENDENT_AMBULATORY_CARE_PROVIDER_SITE_OTHER): Payer: Medicare HMO

## 2017-10-02 VITALS — BP 187/90 | HR 80 | Ht 70.0 in | Wt 206.0 lb

## 2017-10-02 DIAGNOSIS — M25512 Pain in left shoulder: Secondary | ICD-10-CM

## 2017-10-02 NOTE — Progress Notes (Signed)
John MW:NUUVO L John Livings., male DOB:05/12/1956, 62 y.o. ZDG:[id number]  Chief Complaint  John presents with  . Shoulder Pain    left shoulder pain and bruising went to ER    HPI  John KNUEPPEL Sr. is a 62 y.o. male who began having swelling and acute pain of the left shoulder on 09-23-17.  It got worse and he went to the ER on 09-25-17.  He denied any trauma.  He had a large hematoma of the left shoulder with extension into the left upper arm.  He is much improved now.  He has resolving hematoma and ecchymosis of the left shoulder.  He has good motion but if he lifts over 5 pounds he has pain.  He has no numbness.  No x-rays were done. HPI  Body mass index is 29.56 kg/m.  ROS  Review of Systems  Constitutional:       John does not have Diabetes Mellitus. John has hypertension. John has COPD or shortness of breath. John does not have BMI > 35. John has current smoking history.  HENT: Negative for congestion.   Respiratory: Positive for shortness of breath. Negative for cough.   Cardiovascular: Negative for chest pain and leg swelling.  Endocrine: Negative for cold intolerance.  Musculoskeletal: Positive for arthralgias, gait problem, joint swelling and myalgias.  Allergic/Immunologic: Negative for environmental allergies.  All other systems reviewed and are negative.   Past Medical History:  Diagnosis Date  . Arthritis   . COPD (chronic obstructive pulmonary disease) ([place])   . Depression   . Essential hypertension   . H/O gastroesophageal reflux (GERD)   . History of bronchitis   . Hyperlipidemia   . Lung nodule    Right upper lobe lung nodule 1.3 cm   . Shortness of breath dyspnea     Past Surgical History:  Procedure Laterality Date  . COLONOSCOPY    . KNEE ARTHROSCOPY WITH MEDIAL MENISECTOMY Right 04/03/2017   Procedure: KNEE ARTHROSCOPY WITH MEDIAL MENISECTOMY;  Surgeon: Carole Civil, MD;  Location: [facility];  Service: Orthopedics;   Laterality: Right;  . LEG SURGERY Left    plate inserted and removed.  . THORACOTOMY Right 09/25/2015   Procedure: MINI/LIMITED THORACOTOMY WITH RIGHT UPPER LOBE WEDGE RESECTION;  Surgeon: Grace Isaac, MD;  Location: [place];  Service: Thoracic;  Laterality: Right;  . TOOTH EXTRACTION    . VIDEO BRONCHOSCOPY N/A 09/25/2015   Procedure: VIDEO BRONCHOSCOPY;  Surgeon: Grace Isaac, MD;  Location[facility];  Service: Thoracic;  Laterality: N/A;    Family History  Problem Relation Age of Onset  . Hypertension Mother   . Hypertension Father   . Cancer Brother   . CAD Brother   . Stroke Brother     Social History Social History   Tobacco Use  . Smoking status: Former Smoker    Packs/day: 1.00    Years: 40.00    Pack years: 40.00    Types: Cigarettes    Last attempt to quit: 08/16/2015    Years since quitting: 2.1  . Smokeless tobacco: Former Network engineer Use Topics  . Alcohol use: Yes    Alcohol/week: 0.0 oz    Comment: 3-4 times a week  . Drug use: No    No Known Allergies  Current Outpatient Medications  Medication Sig Dispense Refill  . albuterol (PROVENTIL HFA;VENTOLIN HFA) 108 (90 Base) MCG/ACT inhaler Inhale 2 puffs every 6 (six) hours as needed into the lungs for wheezing  or shortness of breath.    . ALPRAZolam (XANAX) 0.5 MG tablet Take 0.5 mg at bedtime as needed by mouth for anxiety or sleep.     Marland Kitchen amLODipine (NORVASC) 10 MG tablet Take 10 mg by mouth daily.    Marland Kitchen atorvastatin (LIPITOR) 40 MG tablet     . HYDROcodone-acetaminophen (NORCO) 7.5-325 MG tablet Take 1 tablet by mouth every 6 (six) hours as needed for moderate pain (Must last 30 days). 100 tablet 0  . lisinopril-hydrochlorothiazide (PRINZIDE,ZESTORETIC) 20-12.5 MG per tablet Take 1 tablet by mouth daily.    . Omega-3 Fatty Acids (FISH OIL PO) Take 1 capsule daily by mouth.    . ondansetron (ZOFRAN) 4 MG tablet Take 1 tablet (4 mg total) by mouth as needed for nausea or vomiting. 10 tablet 0  .  pantoprazole (PROTONIX) 40 MG tablet Take 1 tablet (40 mg total) by mouth daily. 30 tablet 0  . pravastatin (PRAVACHOL) 40 MG tablet Take 40 mg by mouth daily.    Marland Kitchen triamcinolone cream (KENALOG) 0.1 % Apply 1 application daily as needed topically (for rash on feet).      No current facility-administered medications for this visit.      Physical Exam  Blood pressure (!) 187/90, pulse 80, height 5\' 10"  (1.778 m), weight 206 lb (93.4 kg).  Constitutional: overall normal hygiene, normal nutrition, well developed, normal grooming, normal body habitus. Assistive device:none  Musculoskeletal: gait and station Limp none, muscle tone and strength are normal, no tremors or atrophy is present.  .  Neurological: coordination overall normal.  Deep tendon reflex/nerve stretch intact.  Sensation normal.  Cranial nerves II-XII intact.   Skin:   Normal overall no scars, lesions, ulcers or rashes. No psoriasis.  Psychiatric: Alert and oriented x 3.  Recent memory intact, remote memory unclear.  Normal mood and affect. Well groomed.  Good eye contact.  Cardiovascular: overall no swelling, no varicosities, no edema bilaterally, normal temperatures of the legs and arms, no clubbing, cyanosis and good capillary refill.  Lymphatic: palpation is normal.  Left shoulder has full motion but tender in the extremes.  He has significant resolving ecchymosis of the left shoulder and upper arm. He is slightly tender.  NV intact.  I feel no defect of biceps or other muscles of the left upper arm.  Grip is normal.  ROM of the neck is full.  All other systems reviewed and are negative   The John has been educated about the nature of the problem(s) and counseled on treatment options.  The John appeared to understand what I have discussed and is in agreement with it.  X-rays were done of the left shoulder, reported separately.  Encounter Diagnosis  Name Primary?  . Acute pain of left shoulder Yes   I feel he  has had an incomplete tear of biceps or other muscle of the upper arm on the left.  Precautions discussed and activity limits discussed.  PLAN Call if any problems.  Precautions discussed.  Continue current medications.   Return to clinic PRN   Electronically Signed Sanjuana Kava, MD 5/16/201910:43 AM

## 2017-10-14 ENCOUNTER — Telehealth: Payer: Self-pay | Admitting: Radiology

## 2017-10-14 NOTE — Telephone Encounter (Signed)
Insurance company states you did not sign the addendum to the note from 07/30/17, it is showing my electronic signature  Can you please sign the addendum, and I will give it to Rexford to get the knee brace for him.

## 2017-10-14 NOTE — Telephone Encounter (Signed)
I am not sure what is happening but some of my notes come with your name on them how is that possible

## 2017-10-15 NOTE — Telephone Encounter (Signed)
It is because I do often create addendums when you close encounters before I put in orders, so this shows in my name, since I am the one who signs the addendum. I am not sure why this particular one showed in my name, since you did the addendum. I will try to find out, to keep it from happening in the future.

## 2017-10-21 ENCOUNTER — Other Ambulatory Visit: Payer: Self-pay | Admitting: Orthopedic Surgery

## 2017-10-22 MED ORDER — HYDROCODONE-ACETAMINOPHEN 7.5-325 MG PO TABS: 1.0000 | ORAL_TABLET | Freq: Four times a day (QID) | ORAL | 0 refills | Status: DC | PRN

## 2017-11-17 ENCOUNTER — Other Ambulatory Visit: Payer: Self-pay | Admitting: Orthopaedic Surgery

## 2017-11-18 MED ORDER — HYDROCODONE-ACETAMINOPHEN 7.5-325 MG PO TABS: 1.0000 | ORAL_TABLET | Freq: Four times a day (QID) | ORAL | 0 refills | Status: DC | PRN

## 2017-12-18 ENCOUNTER — Other Ambulatory Visit: Payer: Self-pay | Admitting: Orthopaedic Surgery

## 2017-12-22 MED ORDER — HYDROCODONE-ACETAMINOPHEN 7.5-325 MG PO TABS: 1.0000 | ORAL_TABLET | Freq: Four times a day (QID) | ORAL | 0 refills | Status: DC | PRN

## 2018-01-20 ENCOUNTER — Other Ambulatory Visit: Payer: Self-pay

## 2018-01-20 MED ORDER — HYDROCODONE-ACETAMINOPHEN 7.5-325 MG PO TABS: 1.0000 | ORAL_TABLET | Freq: Four times a day (QID) | ORAL | 0 refills | Status: DC | PRN

## 2018-02-16 ENCOUNTER — Other Ambulatory Visit: Payer: Self-pay

## 2018-02-17 MED ORDER — HYDROCODONE-ACETAMINOPHEN 7.5-325 MG PO TABS: 1.0000 | ORAL_TABLET | Freq: Four times a day (QID) | ORAL | 0 refills | Status: DC | PRN

## 2018-03-17 ENCOUNTER — Other Ambulatory Visit: Payer: Self-pay

## 2018-03-17 MED ORDER — HYDROCODONE-ACETAMINOPHEN 7.5-325 MG PO TABS: 1.0000 | ORAL_TABLET | Freq: Four times a day (QID) | ORAL | 0 refills | Status: DC | PRN

## 2018-04-09 ENCOUNTER — Other Ambulatory Visit: Payer: Self-pay

## 2018-04-15 ENCOUNTER — Other Ambulatory Visit: Payer: Self-pay

## 2018-04-15 MED ORDER — HYDROCODONE-ACETAMINOPHEN 7.5-325 MG PO TABS: 1.0000 | ORAL_TABLET | Freq: Four times a day (QID) | ORAL | 0 refills | Status: DC | PRN

## 2018-04-29 ENCOUNTER — Encounter: Payer: Self-pay | Admitting: Orthopaedic Surgery

## 2018-04-29 ENCOUNTER — Ambulatory Visit: Payer: Medicare HMO | Admitting: Orthopaedic Surgery

## 2018-04-29 VITALS — BP 151/82 | HR 86 | Ht 67.0 in | Wt 210.0 lb

## 2018-04-29 DIAGNOSIS — G5601 Carpal tunnel syndrome, right upper limb: Secondary | ICD-10-CM

## 2018-04-29 DIAGNOSIS — M25531 Pain in right wrist: Secondary | ICD-10-CM | POA: Diagnosis not present

## 2018-04-29 MED ORDER — MELOXICAM 7.5 MG PO TABS: 7.5000 mg | ORAL_TABLET | Freq: Every day | ORAL | 2 refills | Status: DC

## 2018-04-29 NOTE — Progress Notes (Signed)
Patient John L [name], male DOB:1956/03/06, 62 y.o. QIO:[id number]  Chief Complaint  Patient presents with  . Shoulder Pain    arm and finger tingling/numbness    HPI  Kash Davie. is a 62 y.o. male who has developed nocturnal tenderness and pain of the right hand over the last month or so.  He has numbness in the median nerve distribution.  He has no trauma.  I will get EMGs done.   Body mass index is 32.89 kg/m.  ROS  Review of Systems  Constitutional:       Patient does not have Diabetes Mellitus. Patient has hypertension. Patient has COPD or shortness of breath. Patient does not have BMI > 35. Patient has current smoking history.  HENT: Negative for congestion.   Respiratory: Positive for shortness of breath. Negative for cough.   Cardiovascular: Negative for chest pain and leg swelling.  Endocrine: Negative for cold intolerance.  Musculoskeletal: Positive for arthralgias, gait problem, joint swelling and myalgias.  Allergic/Immunologic: Negative for environmental allergies.  All other systems reviewed and are negative.   All other systems reviewed and are negative.  The following is a summary of the past history medically, past history surgically, known current medicines, social history and family history.  This information is gathered electronically by the computer from prior information and documentation.  I review this each visit and have found including this information at this point in the chart is beneficial and informative.    Past Medical History:  Diagnosis Date  . Arthritis   . COPD (chronic obstructive pulmonary disease) ([place])   . Depression   . Essential hypertension   . H/O gastroesophageal reflux (GERD)   . History of bronchitis   . Hyperlipidemia   . Lung nodule    Right upper lobe lung nodule 1.3 cm   . Shortness of breath dyspnea     Past Surgical History:  Procedure Laterality Date  . COLONOSCOPY    . KNEE ARTHROSCOPY WITH  MEDIAL MENISECTOMY Right 04/03/2017   Procedure: KNEE ARTHROSCOPY WITH MEDIAL MENISECTOMY;  Surgeon: Carole Civil, MD;  Location: [facility];  Service: Orthopedics;  Laterality: Right;  . LEG SURGERY Left    plate inserted and removed.  . THORACOTOMY Right 09/25/2015   Procedure: MINI/LIMITED THORACOTOMY WITH RIGHT UPPER LOBE WEDGE RESECTION;  Surgeon: Grace Isaac, MD;  Location: [facility];  Service: Thoracic;  Laterality: Right;  . TOOTH EXTRACTION    . VIDEO BRONCHOSCOPY N/A 09/25/2015   Procedure: VIDEO BRONCHOSCOPY;  Surgeon: Grace Isaac, MD;  Location: [facility] OR;  Service: Thoracic;  Laterality: N/A;    Family History  Problem Relation Age of Onset  . Hypertension Mother   . Hypertension Father   . Cancer Brother   . CAD Brother   . Stroke Brother     Social History Social History   Tobacco Use  . Smoking status: Former Smoker    Packs/day: 1.00    Years: 40.00    Pack years: 40.00    Types: Cigarettes    Last attempt to quit: 08/16/2015    Years since quitting: 2.7  . Smokeless tobacco: Former Network engineer Use Topics  . Alcohol use: Yes    Alcohol/week: 0.0 standard drinks    Comment: 3-4 times a week  . Drug use: No    No Known Allergies  Current Outpatient Medications  Medication Sig Dispense Refill  . albuterol (PROVENTIL HFA;VENTOLIN HFA) 108 (90 Base) MCG/ACT inhaler Inhale  2 puffs every 6 (six) hours as needed into the lungs for wheezing or shortness of breath.    . ALPRAZolam (XANAX) 0.5 MG tablet Take 0.5 mg at bedtime as needed by mouth for anxiety or sleep.     Marland Kitchen amLODipine (NORVASC) 10 MG tablet Take 10 mg by mouth daily.    Marland Kitchen atorvastatin (LIPITOR) 40 MG tablet     . HYDROcodone-acetaminophen (NORCO) 7.5-325 MG tablet Take 1 tablet by mouth every 6 (six) hours as needed for moderate pain (Must last 30 days). 100 tablet 0  . lisinopril-hydrochlorothiazide (PRINZIDE,ZESTORETIC) 20-12.5 MG per tablet Take 1 tablet by mouth daily.    . Omega-3 Fatty  Acids (FISH OIL PO) Take 1 capsule daily by mouth.    . ondansetron (ZOFRAN) 4 MG tablet Take 1 tablet (4 mg total) by mouth as needed for nausea or vomiting. 10 tablet 0  . pantoprazole (PROTONIX) 40 MG tablet Take 1 tablet (40 mg total) by mouth daily. 30 tablet 0  . pravastatin (PRAVACHOL) 40 MG tablet Take 40 mg by mouth daily.    Marland Kitchen triamcinolone cream (KENALOG) 0.1 % Apply 1 application daily as needed topically (for rash on feet).      No current facility-administered medications for this visit.      Physical Exam  Blood pressure (!) 151/82, pulse 86, height 5\' 7"  (1.702 m), weight 210 lb (95.3 kg).  Constitutional: overall normal hygiene, normal nutrition, well developed, normal grooming, normal body habitus. Assistive device:none  Musculoskeletal: gait and station Limp none, muscle tone and strength are normal, no tremors or atrophy is present.  .  Neurological: coordination overall normal.  Deep tendon reflex/nerve stretch intact.  Sensation normal.  Cranial nerves II-XII intact.   Skin:   Normal overall no scars, lesions, ulcers or rashes. No psoriasis.  Psychiatric: Alert and oriented x 3.  Recent memory intact, remote memory unclear.  Normal mood and affect. Well groomed.  Good eye contact.  Cardiovascular: overall no swelling, no varicosities, no edema bilaterally, normal temperatures of the legs and arms, no clubbing, cyanosis and good capillary refill.  Lymphatic: palpation is normal.  He has numbness in the median nerve distribution on the right, positive Phalen and negative Tinel sign.  Grip is good.  All other systems reviewed and are negative   The patient has been educated about the nature of the problem(s) and counseled on treatment options.  The patient appeared to understand what I have discussed and is in agreement with it.  Encounter Diagnoses  Name Primary?  . Wrist pain, right Yes  . Carpal tunnel syndrome on right     PLAN Call if any problems.   Precautions discussed.  Continue current medications.   Return to clinic after EMG study   Electronically Signed Sanjuana Kava, MD 12/11/20199:50 AM

## 2018-04-29 NOTE — Patient Instructions (Signed)
An order has been sent to [facility]  [address] [place], [place]  [address].  Phone number [phone] requesting a nerve conduction study for you.  They will call you with an appointment.  If you have not been given an appointment in a reasonable amount of time please call them and ask to be scheduled. 

## 2018-05-14 ENCOUNTER — Other Ambulatory Visit: Payer: Self-pay

## 2018-05-15 MED ORDER — HYDROCODONE-ACETAMINOPHEN 7.5-325 MG PO TABS: 1.0000 | ORAL_TABLET | Freq: Four times a day (QID) | ORAL | 0 refills | Status: DC | PRN

## 2018-06-14 ENCOUNTER — Other Ambulatory Visit: Payer: Self-pay

## 2018-06-15 MED ORDER — HYDROCODONE-ACETAMINOPHEN 7.5-325 MG PO TABS: 1.0000 | ORAL_TABLET | Freq: Four times a day (QID) | ORAL | 0 refills | Status: DC | PRN

## 2018-07-14 ENCOUNTER — Other Ambulatory Visit: Payer: Self-pay

## 2018-07-14 MED ORDER — HYDROCODONE-ACETAMINOPHEN 7.5-325 MG PO TABS: 1.0000 | ORAL_TABLET | Freq: Four times a day (QID) | ORAL | 0 refills | Status: DC | PRN

## 2018-08-09 ENCOUNTER — Other Ambulatory Visit: Payer: Self-pay

## 2018-08-10 MED ORDER — HYDROCODONE-ACETAMINOPHEN 7.5-325 MG PO TABS: 1.0000 | ORAL_TABLET | Freq: Four times a day (QID) | ORAL | 0 refills | Status: DC | PRN

## 2018-08-10 NOTE — Telephone Encounter (Signed)
Dr Raliegh Ip s patient

## 2018-09-07 ENCOUNTER — Other Ambulatory Visit: Payer: Self-pay

## 2018-09-08 MED ORDER — HYDROCODONE-ACETAMINOPHEN 7.5-325 MG PO TABS: 1.0000 | ORAL_TABLET | Freq: Four times a day (QID) | ORAL | 0 refills | Status: DC | PRN

## 2018-10-05 ENCOUNTER — Other Ambulatory Visit: Payer: Self-pay

## 2018-10-06 MED ORDER — HYDROCODONE-ACETAMINOPHEN 7.5-325 MG PO TABS: 1.0000 | ORAL_TABLET | Freq: Four times a day (QID) | ORAL | 0 refills | Status: DC | PRN

## 2018-10-13 ENCOUNTER — Telehealth: Payer: Self-pay | Admitting: Orthopaedic Surgery

## 2018-10-13 DIAGNOSIS — M25531 Pain in right wrist: Secondary | ICD-10-CM

## 2018-10-13 NOTE — Telephone Encounter (Signed)
Patient came by office to follow up on referral for nerve conduction study, per his office visit 04/29/18. States he opted to try medication first; said now would like to proceed with referral to: (per Dr Brooke Bonito last note): "order has been sent to [facility]  [address] [place], [place]  [address].  Phone number [phone] requesting a nerve conduction study."   Patient has been unable to reach them. Also noted, referral is under our previous department name.  Patient states he is glad to have a new appointment if needed. Please advise   [phone] (Preferred)  Home Phone: [phone]

## 2018-10-14 NOTE — Telephone Encounter (Signed)
Reordered and faxed

## 2018-10-14 NOTE — Telephone Encounter (Signed)
Reorder it.  Then I could see him

## 2018-10-14 NOTE — Telephone Encounter (Signed)
Is it ok to reorder or will pt need a follow up? Please advise.

## 2018-11-04 ENCOUNTER — Other Ambulatory Visit: Payer: Self-pay

## 2018-11-04 MED ORDER — HYDROCODONE-ACETAMINOPHEN 7.5-325 MG PO TABS: 1.0000 | ORAL_TABLET | Freq: Four times a day (QID) | ORAL | 0 refills | Status: DC | PRN

## 2018-11-09 ENCOUNTER — Encounter: Payer: Self-pay | Admitting: Orthopaedic Surgery

## 2018-11-24 ENCOUNTER — Encounter: Payer: Self-pay | Admitting: Orthopaedic Surgery

## 2018-11-24 ENCOUNTER — Ambulatory Visit (INDEPENDENT_AMBULATORY_CARE_PROVIDER_SITE_OTHER): Payer: Medicare HMO | Admitting: Orthopaedic Surgery

## 2018-11-24 ENCOUNTER — Other Ambulatory Visit: Payer: Self-pay

## 2018-11-24 VITALS — BP 131/77 | HR 94 | Temp 98.6°F | Ht 67.0 in | Wt 204.0 lb

## 2018-11-24 DIAGNOSIS — G5602 Carpal tunnel syndrome, left upper limb: Secondary | ICD-10-CM

## 2018-11-24 DIAGNOSIS — G5603 Carpal tunnel syndrome, bilateral upper limbs: Secondary | ICD-10-CM | POA: Diagnosis not present

## 2018-11-24 DIAGNOSIS — G5601 Carpal tunnel syndrome, right upper limb: Secondary | ICD-10-CM

## 2018-11-24 NOTE — Progress Notes (Signed)
Patient John L [name], male DOB:06/15/1955, 63 y.o. IHK:[id number]  Chief Complaint  Patient presents with  . Results    review EMG /NCV     HPI  John Grand. is a 63 y.o. male who has bilateral carpal tunnel symptoms.  He had EMGs done showing he has it severe bilaterally.  He wants the right one done first.  I will have Dr. Aline Brochure see him about surgery.  The patient is agreeable to this.   Body mass index is 31.95 kg/m.  ROS  Review of Systems  Constitutional:       Patient does not have Diabetes Mellitus. Patient has hypertension. Patient has COPD or shortness of breath. Patient does not have BMI > 35. Patient has current smoking history.  HENT: Negative for congestion.   Respiratory: Positive for shortness of breath. Negative for cough.   Cardiovascular: Negative for chest pain and leg swelling.  Endocrine: Negative for cold intolerance.  Musculoskeletal: Positive for arthralgias, gait problem, joint swelling and myalgias.  Allergic/Immunologic: Negative for environmental allergies.  All other systems reviewed and are negative.   All other systems reviewed and are negative.  The following is a summary of the past history medically, past history surgically, known current medicines, social history and family history.  This information is gathered electronically by the computer from prior information and documentation.  I review this each visit and have found including this information at this point in the chart is beneficial and informative.    Past Medical History:  Diagnosis Date  . Arthritis   . COPD (chronic obstructive pulmonary disease) ([place])   . Depression   . Essential hypertension   . H/O gastroesophageal reflux (GERD)   . History of bronchitis   . Hyperlipidemia   . Lung nodule    Right upper lobe lung nodule 1.3 cm   . Shortness of breath dyspnea     Past Surgical History:  Procedure Laterality Date  . COLONOSCOPY    . KNEE ARTHROSCOPY  WITH MEDIAL MENISECTOMY Right 04/03/2017   Procedure: KNEE ARTHROSCOPY WITH MEDIAL MENISECTOMY;  Surgeon: Carole Civil, MD;  Location: [facility];  Service: Orthopedics;  Laterality: Right;  . LEG SURGERY Left    plate inserted and removed.  . THORACOTOMY Right 09/25/2015   Procedure: MINI/LIMITED THORACOTOMY WITH RIGHT UPPER LOBE WEDGE RESECTION;  Surgeon: Grace Isaac, MD;  Location: [facility];  Service: Thoracic;  Laterality: Right;  . TOOTH EXTRACTION    . VIDEO BRONCHOSCOPY N/A 09/25/2015   Procedure: VIDEO BRONCHOSCOPY;  Surgeon: Grace Isaac, MD;  [facility]: [facility];  Service: Thoracic;  Laterality: N/A;    Family History  Problem Relation Age of Onset  . Hypertension Mother   . Hypertension Father   . Cancer Brother   . CAD Brother   . Stroke Brother     Social History Social History   Tobacco Use  . Smoking status: Former Smoker    Packs/day: 1.00    Years: 40.00    Pack years: 40.00    Types: Cigarettes    Quit date: 08/16/2015    Years since quitting: 3.2  . Smokeless tobacco: Former Network engineer Use Topics  . Alcohol use: Yes    Alcohol/week: 0.0 standard drinks    Comment: 3-4 times a week  . Drug use: No    No Known Allergies  Current Outpatient Medications  Medication Sig Dispense Refill  . albuterol (PROVENTIL HFA;VENTOLIN HFA) 108 (90 Base) MCG/ACT  inhaler Inhale 2 puffs every 6 (six) hours as needed into the lungs for wheezing or shortness of breath.    . ALPRAZolam (XANAX) 0.5 MG tablet Take 0.5 mg at bedtime as needed by mouth for anxiety or sleep.     Marland Kitchen amLODipine (NORVASC) 10 MG tablet Take 10 mg by mouth daily.    Marland Kitchen atorvastatin (LIPITOR) 40 MG tablet     . HYDROcodone-acetaminophen (NORCO) 7.5-325 MG tablet Take 1 tablet by mouth every 6 (six) hours as needed for moderate pain (Must last 30 days). 95 tablet 0  . lisinopril-hydrochlorothiazide (PRINZIDE,ZESTORETIC) 20-12.5 MG per tablet Take 1 tablet by mouth daily.    . meloxicam (MOBIC)  7.5 MG tablet Take 1 tablet (7.5 mg total) by mouth daily. 30 tablet 2  . Omega-3 Fatty Acids (FISH OIL PO) Take 1 capsule daily by mouth.    . ondansetron (ZOFRAN) 4 MG tablet Take 1 tablet (4 mg total) by mouth as needed for nausea or vomiting. 10 tablet 0  . pantoprazole (PROTONIX) 40 MG tablet Take 1 tablet (40 mg total) by mouth daily. 30 tablet 0  . pravastatin (PRAVACHOL) 40 MG tablet Take 40 mg by mouth daily.    Marland Kitchen triamcinolone cream (KENALOG) 0.1 % Apply 1 application daily as needed topically (for rash on feet).     . TRELEGY ELLIPTA 100-62.5-25 MCG/INH AEPB INHALE 1 PUFF INTO THE LUNGS D     No current facility-administered medications for this visit.      Physical Exam  Blood pressure 131/77, pulse 94, temperature 98.6 F (37 C), height 5\' 7"  (1.702 m), weight 204 lb (92.5 kg).  Constitutional: overall normal hygiene, normal nutrition, well developed, normal grooming, normal body habitus. Assistive device:none  Musculoskeletal: gait and station Limp none, muscle tone and strength are normal, no tremors or atrophy is present.  .  Neurological: coordination overall normal.  Deep tendon reflex/nerve stretch intact.  Sensation normal.  Cranial nerves II-XII intact.   Skin:   Normal overall no scars, lesions, ulcers or rashes. No psoriasis.  Psychiatric: Alert and oriented x 3.  Recent memory intact, remote memory unclear.  Normal mood and affect. Well groomed.  Good eye contact.  Cardiovascular: overall no swelling, no varicosities, no edema bilaterally, normal temperatures of the legs and arms, no clubbing, cyanosis and good capillary refill.  Lymphatic: palpation is normal.  He has bilateral Phalen and Tinel signs and decreased sensation of median nerve bilaterally.  All other systems reviewed and are negative   The patient has been educated about the nature of the problem(s) and counseled on treatment options.  The patient appeared to understand what I have discussed  and is in agreement with it.  Encounter Diagnoses  Name Primary?  . Carpal tunnel syndrome on right Yes  . Carpal tunnel syndrome, left upper limb     PLAN Call if any problems.  Precautions discussed.  Continue current medications.   Return to clinic to see Dr. Aline Brochure for surgery.   Electronically Signed Sanjuana Kava, MD 7/7/202010:26 AM

## 2018-11-25 ENCOUNTER — Ambulatory Visit: Payer: Medicare HMO | Admitting: Orthopedic Surgery

## 2018-12-03 ENCOUNTER — Other Ambulatory Visit: Payer: Self-pay

## 2018-12-06 ENCOUNTER — Other Ambulatory Visit: Payer: Self-pay

## 2018-12-07 ENCOUNTER — Other Ambulatory Visit: Payer: Self-pay

## 2018-12-07 ENCOUNTER — Encounter: Payer: Self-pay | Admitting: Orthopedic Surgery

## 2018-12-07 ENCOUNTER — Ambulatory Visit (INDEPENDENT_AMBULATORY_CARE_PROVIDER_SITE_OTHER): Payer: Medicare HMO | Admitting: Orthopedic Surgery

## 2018-12-07 VITALS — BP 137/78 | HR 93 | Temp 98.1°F | Ht 67.0 in | Wt 204.0 lb

## 2018-12-07 DIAGNOSIS — G5601 Carpal tunnel syndrome, right upper limb: Secondary | ICD-10-CM | POA: Diagnosis not present

## 2018-12-07 NOTE — Progress Notes (Signed)
John Mote Sr.  12/07/2018  HISTORY SECTION :  Chief Complaint  Patient presents with  . Hand Pain   63 year old male bilateral carpal tunnel syndromes diagnosed clinically and confirmed with EMG nerve conduction studies presents for preop for surgical release  His complaints are pain right hand tingling right hand numbness especially long finger pain over the wrist burning type sensation constant not improving no improvement with nonoperative treatment    Review of Systems  Constitutional: Negative for chills and fever.  Respiratory: Negative for shortness of breath.   Cardiovascular: Negative for chest pain.  Neurological: Positive for tingling and sensory change.     Past Medical History:  Diagnosis Date  . Arthritis   . COPD (chronic obstructive pulmonary disease) ([place])   . Depression   . Essential hypertension   . H/O gastroesophageal reflux (GERD)   . History of bronchitis   . Hyperlipidemia   . Lung nodule    Right upper lobe lung nodule 1.3 cm   . Shortness of breath dyspnea     Past Surgical History:  Procedure Laterality Date  . COLONOSCOPY    . KNEE ARTHROSCOPY WITH MEDIAL MENISECTOMY Right 04/03/2017   Procedure: KNEE ARTHROSCOPY WITH MEDIAL MENISECTOMY;  Surgeon: Carole Civil, MD;  Location: [facility];  Service: Orthopedics;  Laterality: Right;  . LEG SURGERY Left    plate inserted and removed.  . THORACOTOMY Right 09/25/2015   Procedure: MINI/LIMITED THORACOTOMY WITH RIGHT UPPER LOBE WEDGE RESECTION;  Surgeon: Grace Isaac, MD;  Location: [facility];  Service: Thoracic;  Laterality: Right;  . TOOTH EXTRACTION    . VIDEO BRONCHOSCOPY N/A 09/25/2015   Procedure: VIDEO BRONCHOSCOPY;  Surgeon: Grace Isaac, MD;  Location: [facility] OR;  Service: Thoracic;  Laterality: N/A;     No Known Allergies   Current Outpatient Medications:  .  albuterol (PROVENTIL HFA;VENTOLIN HFA) 108 (90 Base) MCG/ACT inhaler, Inhale 2 puffs every 6 (six) hours as needed into  the lungs for wheezing or shortness of breath., Disp: , Rfl:  .  ALPRAZolam (XANAX) 0.5 MG tablet, Take 0.5 mg at bedtime as needed by mouth for anxiety or sleep. , Disp: , Rfl:  .  amLODipine (NORVASC) 10 MG tablet, Take 10 mg by mouth daily., Disp: , Rfl:  .  atorvastatin (LIPITOR) 40 MG tablet, , Disp: , Rfl:  .  HYDROcodone-acetaminophen (NORCO) 7.5-325 MG tablet, Take 1 tablet by mouth every 6 (six) hours as needed for moderate pain (Must last 30 days)., Disp: 95 tablet, Rfl: 0 .  lisinopril-hydrochlorothiazide (PRINZIDE,ZESTORETIC) 20-12.5 MG per tablet, Take 1 tablet by mouth daily., Disp: , Rfl:  .  Omega-3 Fatty Acids (FISH OIL PO), Take 1 capsule daily by mouth., Disp: , Rfl:  .  ondansetron (ZOFRAN) 4 MG tablet, Take 1 tablet (4 mg total) by mouth as needed for nausea or vomiting., Disp: 10 tablet, Rfl: 0 .  pantoprazole (PROTONIX) 40 MG tablet, Take 1 tablet (40 mg total) by mouth daily., Disp: 30 tablet, Rfl: 0 .  pravastatin (PRAVACHOL) 40 MG tablet, Take 40 mg by mouth daily., Disp: , Rfl:  .  triamcinolone cream (KENALOG) 0.1 %, Apply 1 application daily as needed topically (for rash on feet). , Disp: , Rfl:  .  meloxicam (MOBIC) 7.5 MG tablet, Take 1 tablet (7.5 mg total) by mouth daily. (Patient not taking: Reported on 12/07/2018), Disp: 30 tablet, Rfl: 2   PHYSICAL EXAM SECTION: 1) Temp 98.1 F (36.7 C)   Ht 5'  7" (1.702 m)   Wt 204 lb (92.5 kg)   BMI 31.95 kg/m   Body mass index is 31.95 kg/m. General appearance: Well-developed well-nourished no gross deformities  2) Cardiovascular normal pulse and perfusion  normal color without edema  3) Neurologically deep tendon reflexes are equal and normal,  4) Psychological: Awake alert and oriented x3 mood and affect normal  5) Skin no lacerations or ulcerations no nodularity no palpable masses, no erythema or nodularity  6) Musculoskeletal:    Tenderness over the carpal tunnel some decreased range of motion in the IP  joints of the hand wrist joint is stable muscle tone is normal he has some atrophy in the thenar eminence with decreased sensation in the long index and thumb Skin otherwise intact     MEDICAL DECISION SECTION:  No diagnosis found.   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 Alternative treatment has also been reviewed, questions were encouraged and answered. The postoperative plan is also been reviewed.  10:42 AM

## 2018-12-07 NOTE — Patient Instructions (Signed)
Carpal Tunnel Syndrome  Carpal tunnel syndrome is a condition that causes pain in your hand and arm. The carpal tunnel is a narrow area that is on the palm side of your wrist. Repeated wrist motion or certain diseases may cause swelling in the tunnel. This swelling can pinch the main nerve in the wrist (median nerve). What are the causes? This condition may be caused by:  Repeated wrist motions.  Wrist injuries.  Arthritis.  A sac of fluid (cyst) or abnormal growth (tumor) in the carpal tunnel.  Fluid buildup during pregnancy. Sometimes the cause is not known. What increases the risk? The following factors may make you more likely to develop this condition:  Having a job in which you move your wrist in the same way many times. This includes jobs like being a butcher or a cashier.  Being a woman.  Having other health conditions, such as: ? Diabetes. ? Obesity. ? A thyroid gland that is not active enough (hypothyroidism). ? Kidney failure. What are the signs or symptoms? Symptoms of this condition include:  A tingling feeling in your fingers.  Tingling or a loss of feeling (numbness) in your hand.  Pain in your entire arm. This pain may get worse when you bend your wrist and elbow for a long time.  Pain in your wrist that goes up your arm to your shoulder.  Pain that goes down into your palm or fingers.  A weak feeling in your hands. You may find it hard to grab and hold items. You may feel worse at night. How is this diagnosed? This condition is diagnosed with a medical history and physical exam. You may also have tests, such as:  Electromyogram (EMG). This test checks the signals that the nerves send to the muscles.  Nerve conduction study. This test checks how well signals pass through your nerves.  Imaging tests, such as X-rays, ultrasound, and MRI. These tests check for what might be the cause of your condition. How is this treated? This condition may be treated  with:  Lifestyle changes. You will be asked to stop or change the activity that caused your problem.  Doing exercise and activities that make bones and muscles stronger (physical therapy).  Learning how to use your hand again (occupational therapy).  Medicines for pain and swelling (inflammation). You may have injections in your wrist.  A wrist splint.  Surgery. Follow these instructions at home: If you have a splint:  Wear the splint as told by your doctor. Remove it only as told by your doctor.  Loosen the splint if your fingers: ? Tingle. ? Lose feeling (become numb). ? Turn cold and blue.  Keep the splint clean.  If the splint is not waterproof: ? Do not let it get wet. ? Cover it with a watertight covering when you take a bath or a shower. Managing pain, stiffness, and swelling   If told, put ice on the painful area: ? If you have a removable splint, remove it as told by your doctor. ? Put ice in a plastic bag. ? Place a towel between your skin and the bag. ? Leave the ice on for 20 minutes, 2-3 times per day. General instructions  Take over-the-counter and prescription medicines only as told by your doctor.  Rest your wrist from any activity that may cause pain. If needed, talk with your boss at work about changes that can help your wrist heal.  Do any exercises as told by your doctor,   physical therapist, or occupational therapist.  Keep all follow-up visits as told by your doctor. This is important. Contact a doctor if:  You have new symptoms.  Medicine does not help your pain.  Your symptoms get worse. Get help right away if:  You have very bad numbness or tingling in your wrist or hand. Summary  Carpal tunnel syndrome is a condition that causes pain in your hand and arm.  It is often caused by repeated wrist motions.  Lifestyle changes and medicines are used to treat this problem. Surgery may help in very bad cases.  Follow your doctor's  instructions about wearing a splint, resting your wrist, keeping follow-up visits, and calling for help. This information is not intended to replace advice given to you by your health care provider. Make sure you discuss any questions you have with your health care provider. Document Released: 04/25/2011 Document Revised: 09/12/2017 Document Reviewed: 09/12/2017 Elsevier Patient Education  [place].  Open Carpal Tunnel Release  Open carpal tunnel release is a surgery to relieve symptoms caused by carpal tunnel syndrome. The carpal tunnel is a narrow, hollow space in the wrist. It is located between the wrist bones and a band of connective tissue (transverse carpal ligament, also known as the flexor retinaculum). The nerve that supplies most of the hand (median nerve) passes through the carpal tunnel, and so do tissues that connect bones to muscles (tendons) in the hand and arm. Carpal tunnel syndrome makes this space swell and become narrow. The swelling pinches the median nerve and causes pain and numbness. During carpal tunnel release surgery, the transverse carpal ligament is cut to make more room in the carpal tunnel space. This also lessens the pressure on the median nerve. You may have this surgery if other types of treatment have not relieved your carpal tunnel symptoms. This surgery is usually done only for the hand that you use more often (dominant hand), but it may be done for both hands depending on your symptoms. Tell a health care provider about:  Any allergies you have.  All medicines you are taking, including vitamins, herbs, eye drops, creams, and over-the-counter medicines.  Any problems you or family members have had with anesthetic medicines.  Any blood disorders you have.  Any surgeries you have had.  Any medical conditions you have.  Whether you are pregnant or may be pregnant. What are the risks? Generally, this is a safe procedure. However, problems may  occur, including:  Infection.  Bleeding.  Injury to the median nerve.  Allergic reactions to medicines.  The surgery failing to relieve your symptoms, or making your symptoms worse. What happens before the procedure? Medicines  Ask your health care provider about: ? Changing or stopping your regular medicines. This is especially important if you are taking diabetes medicines or blood thinners. ? Taking medicines such as aspirin and ibuprofen. These medicines can thin your blood. Do not take these medicines unless your health care provider tells you to take them. ? Taking over-the-counter medicines, vitamins, herbs, and supplements.  You may be given antibiotic medicine to help prevent infection. Staying hydrated Follow instructions from your health care provider about hydration, which may include:  Up to 2 hours before the procedure - you may continue to drink clear liquids, such as water, clear fruit juice, black coffee, and plain tea. Eating and drinking restrictions Follow instructions from your health care provider about eating and drinking, which may include:  8 hours before the procedure -  stop eating heavy meals or foods such as meat, fried foods, or fatty foods.  6 hours before the procedure - stop eating light meals or foods, such as toast or cereal.  6 hours before the procedure - stop drinking milk or drinks that contain milk.  2 hours before the procedure - stop drinking clear liquids. General instructions  Ask your health care provider how your surgical site will be marked or identified.  You may be asked to shower with a germ-killing soap.  Plan to have someone take you home from the hospital or clinic.  Plan to have a responsible adult care for you for at least 24 hours after you leave the hospital or clinic. This is important. What happens during the procedure?  To lower your risk of infection: ? Your health care team will wash or sanitize their hands. ?  Hair may be removed from the surgical area. ? Your arm, hand, and wrist will be cleaned with a germ-killing (antiseptic) solution.  An IV will be inserted into one of your veins.  You will be given one of the following: ? A medicine to numb the wrist area (local anesthetic). You may also be given a medicine to help you relax (sedative). ? A medicine to make you fall asleep (general anesthetic).  An incision will be made in your wrist, on the same side as your palm.  The skin of your wrist will be spread to expose the transverse carpal ligament.  The transverse carpal ligament will be cut to make more room in the carpal tunnel space.  Your incision will be closed with stitches (sutures) or staples.  A bandage (dressing) will be placed over your wrist and wrapped around your hand and wrist. The procedure may vary among health care providers and hospitals. What happens after the procedure?  Your blood pressure, heart rate, breathing rate, and blood oxygen level will be monitored until the medicines you were given have worn off.  You will be given pain medicine as needed.  A splint or brace may be placed over your dressing, to hold your hand and wrist in place while you heal.  Do not drive until your health care provider approves. Summary  Carpal tunnel release is a surgery to relieve pain and numbness in the hand caused by swelling around a nerve (carpal tunnel syndrome).  You may have this surgery if other types of treatment have not relieved your carpal tunnel symptoms.  During carpal tunnel release surgery, a band of connective tissue (transverse carpal ligament) is cut to make more room in the carpal tunnel space. This information is not intended to replace advice given to you by your health care provider. Make sure you discuss any questions you have with your health care provider. Document Released: 07/27/2003 Document Revised: 04/18/2017 Document Reviewed: 01/13/2017 Elsevier  Patient Education  2020 [employer].

## 2018-12-07 NOTE — H&P (View-Only) (Signed)
John Mote Sr.  12/07/2018  HISTORY SECTION :  Chief Complaint  Patient presents with  . Hand Pain   63 year old male bilateral carpal tunnel syndromes diagnosed clinically and confirmed with EMG nerve conduction studies presents for preop for surgical release  His complaints are pain right hand tingling right hand numbness especially long finger pain over the wrist burning type sensation constant not improving no improvement with nonoperative treatment    Review of Systems  Constitutional: Negative for chills and fever.  Respiratory: Negative for shortness of breath.   Cardiovascular: Negative for chest pain.  Neurological: Positive for tingling and sensory change.     Past Medical History:  Diagnosis Date  . Arthritis   . COPD (chronic obstructive pulmonary disease) ([place])   . Depression   . Essential hypertension   . H/O gastroesophageal reflux (GERD)   . History of bronchitis   . Hyperlipidemia   . Lung nodule    Right upper lobe lung nodule 1.3 cm   . Shortness of breath dyspnea     Past Surgical History:  Procedure Laterality Date  . COLONOSCOPY    . KNEE ARTHROSCOPY WITH MEDIAL MENISECTOMY Right 04/03/2017   Procedure: KNEE ARTHROSCOPY WITH MEDIAL MENISECTOMY;  Surgeon: Carole Civil, MD;  Location: [facility];  Service: Orthopedics;  Laterality: Right;  . LEG SURGERY Left    plate inserted and removed.  . THORACOTOMY Right 09/25/2015   Procedure: MINI/LIMITED THORACOTOMY WITH RIGHT UPPER LOBE WEDGE RESECTION;  Surgeon: Grace Isaac, MD;  Location: [facility];  Service: Thoracic;  Laterality: Right;  . TOOTH EXTRACTION    . VIDEO BRONCHOSCOPY N/A 09/25/2015   Procedure: VIDEO BRONCHOSCOPY;  Surgeon: Grace Isaac, MD;  Location: [facility];  Service: Thoracic;  Laterality: N/A;     No Known Allergies   Current Outpatient Medications:  .  albuterol (PROVENTIL HFA;VENTOLIN HFA) 108 (90 Base) MCG/ACT inhaler, Inhale 2 puffs every 6 (six) hours as needed into  the lungs for wheezing or shortness of breath., Disp: , Rfl:  .  ALPRAZolam (XANAX) 0.5 MG tablet, Take 0.5 mg at bedtime as needed by mouth for anxiety or sleep. , Disp: , Rfl:  .  amLODipine (NORVASC) 10 MG tablet, Take 10 mg by mouth daily., Disp: , Rfl:  .  atorvastatin (LIPITOR) 40 MG tablet, , Disp: , Rfl:  .  HYDROcodone-acetaminophen (NORCO) 7.5-325 MG tablet, Take 1 tablet by mouth every 6 (six) hours as needed for moderate pain (Must last 30 days)., Disp: 95 tablet, Rfl: 0 .  lisinopril-hydrochlorothiazide (PRINZIDE,ZESTORETIC) 20-12.5 MG per tablet, Take 1 tablet by mouth daily., Disp: , Rfl:  .  Omega-3 Fatty Acids (FISH OIL PO), Take 1 capsule daily by mouth., Disp: , Rfl:  .  ondansetron (ZOFRAN) 4 MG tablet, Take 1 tablet (4 mg total) by mouth as needed for nausea or vomiting., Disp: 10 tablet, Rfl: 0 .  pantoprazole (PROTONIX) 40 MG tablet, Take 1 tablet (40 mg total) by mouth daily., Disp: 30 tablet, Rfl: 0 .  pravastatin (PRAVACHOL) 40 MG tablet, Take 40 mg by mouth daily., Disp: , Rfl:  .  triamcinolone cream (KENALOG) 0.1 %, Apply 1 application daily as needed topically (for rash on feet). , Disp: , Rfl:  .  meloxicam (MOBIC) 7.5 MG tablet, Take 1 tablet (7.5 mg total) by mouth daily. (Patient not taking: Reported on 12/07/2018), Disp: 30 tablet, Rfl: 2   PHYSICAL EXAM SECTION: 1) Temp 98.1 F (36.7 C)   Ht 5'  7" (1.702 m)   Wt 204 lb (92.5 kg)   BMI 31.95 kg/m   Body mass index is 31.95 kg/m. General appearance: Well-developed well-nourished no gross deformities  2) Cardiovascular normal pulse and perfusion  normal color without edema  3) Neurologically deep tendon reflexes are equal and normal,  4) Psychological: Awake alert and oriented x3 mood and affect normal  5) Skin no lacerations or ulcerations no nodularity no palpable masses, no erythema or nodularity  6) Musculoskeletal:    Tenderness over the carpal tunnel some decreased range of motion in the IP  joints of the hand wrist joint is stable muscle tone is normal he has some atrophy in the thenar eminence with decreased sensation in the long index and thumb Skin otherwise intact     MEDICAL DECISION SECTION:  No diagnosis found.   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 Alternative treatment has also been reviewed, questions were encouraged and answered. The postoperative plan is also been reviewed.  10:42 AM

## 2018-12-08 ENCOUNTER — Telehealth: Payer: Self-pay | Admitting: Radiology

## 2018-12-08 MED ORDER — HYDROCODONE-ACETAMINOPHEN 7.5-325 MG PO TABS: 1.0000 | ORAL_TABLET | Freq: Four times a day (QID) | ORAL | 0 refills | Status: DC | PRN

## 2018-12-08 NOTE — Telephone Encounter (Signed)
I have spent over an hour today trying to get authorization for patient to have carpal tunnel release Hung up on twice and the third time the person I spoke to was not audible, sounded like she was in a tunnel or can and speaking a foreign language  I have given all the numbers to her and hopefully she has started a case for this, I don' t know what else to do.   If [employer] does not call me back about this case I will have to RS it.

## 2018-12-08 NOTE — Telephone Encounter (Signed)
Faxed clinicals to a fax number I have found for [facility], since they are having phone problems.    Hopefully this will work, fax [phone]

## 2018-12-09 NOTE — Telephone Encounter (Signed)
Give to office mnger

## 2018-12-09 NOTE — Telephone Encounter (Signed)
John Bridges is aware, thanks

## 2018-12-14 NOTE — Patient Instructions (Signed)
John Bridges.  12/14/2018     @PREFPERIOPPHARMACY @   Your procedure is scheduled on  12/17/2018  Report to [facility] at  615  A.M.  Call this number if you have problems the morning of surgery:  [phone]   Remember:  Do not eat or drink after midnight.                        Take these medicines the morning of surgery with A SIP OF WATER  Amlodipine, hydrocodone(if needed), zofran(if needed), protonix. Use your inhalers before you come.    Do not wear jewelry, make-up or nail polish.  Do not wear lotions, powders, or perfumes, or deodorant.  Do not shave 48 hours prior to surgery.  Men may shave face and neck.  Do not bring valuables to the hospital.  [facility] is not responsible for any belongings or valuables.  Contacts, dentures or bridgework may not be worn into surgery.  Leave your suitcase in the car.  After surgery it may be brought to your room.  For patients admitted to the hospital, discharge time will be determined by your treatment team.  Patients discharged the day of surgery will not be allowed to drive home.   Name and phone number of your driver:   family Special instructions:  None  Please read over the following fact sheets that you were given. Anesthesia Post-op Instructions and Care and Recovery After Surgery       Open Carpal Tunnel Release, Care After This sheet gives you information about how to care for yourself after your procedure. Your health care provider may also give you more specific instructions. If you have problems or questions, contact your health care provider. What can I expect after the procedure? After the procedure, it is common to have:  Wrist stiffness.  Bruising. Follow these instructions at home: Bathing  Do not take baths, swim, or use a hot tub until your health care provider approves. Ask your health care provider if you may take showers.  Keep your bandage (dressing) dry until your health care  provider says it can be removed. If you have a splint or brace:  Wear the splint or brace as told by your health care provider. You may need to wear it for 2-3 weeks. Remove it only as told by your health care provider.  Loosen the splint or brace if your fingers tingle, become numb, or turn cold and blue.  Keep the splint or brace clean.  If the splint or brace is not waterproof: ? Do not let it get wet. ? Cover it with a watertight covering when you take a bath or a shower. Incision care   Follow instructions from your health care provider about how to take care of your incision. Make sure you: ? Wash your hands with soap and water before you change your dressing. If soap and water are not available, use hand sanitizer. ? Change your dressing as told by your health care provider. ? Leave stitches (sutures), skin glue, or adhesive strips in place. These skin closures may need to stay in place for 2 weeks or longer. If adhesive strip edges start to loosen and curl up, you may trim the loose edges. Do not remove adhesive strips completely unless your health care provider tells you to do that.  Check your incision area every day for signs of infection. Check for: ?  Redness, swelling, or pain. ? Fluid or blood. ? Warmth. ? Pus or a bad smell. Managing pain, stiffness, and swelling   If directed, put ice on the affected area. ? If you have a removable splint or brace, remove it as told by your health care provider. ? Put ice in a plastic bag. ? Place a towel between your skin and the bag. ? Leave the ice on for 20 minutes, 2-3 times a day.  Move your fingers often to avoid stiffness and to lessen swelling.  Raise (elevate) your wrist above the level of your heart while you are sitting or lying down. Activity  Do not drive until your health care provider approves.  Do not drive or use heavy machinery while taking prescription pain medicine.  Return to your normal activities as  told by your health care provider. Avoid activities that cause pain.  If physical therapy was prescribed, do exercises as told by your therapist. Physical therapy can help you heal faster and regain movement. General instructions  Take over-the-counter and prescription medicines only as told by your health care provider.  If you are taking prescription pain medicine, take actions to prevent or treat constipation. Your health care provider may recommend that you: ? Drink enough fluid to keep your urine pale yellow. ? Eat foods that are high in fiber, such as fresh fruits and vegetables, whole grains, and beans. ? Limit foods that are high in fat and processed sugars, such as fried or sweet foods. ? Take an over-the-counter or prescription medicine for constipation.  Do not use any products that contain nicotine or tobacco, such as cigarettes and e-cigarettes. If you need help quitting, ask your health care provider.  Keep all follow-up visits as told by your health care provider and physical therapist. This is important. Contact a health care provider if:  You have redness or swelling around your incision.  You have fluid or blood coming from your incision.  Your incision feels warm to the touch.  You have pus or a bad smell coming from your incision.  You have a fever.  You have chills.  You have pain that does not get better with medicine.  Your carpal tunnel symptoms do not go away after 2 months.  Your carpal tunnel symptoms go away and then come back. Get help right away if:  You have pain or numbness that is getting worse.  Your fingers or fingertips become very pale or bluish in color.  You are not able to move your fingers. Summary  It is common to have wrist stiffness and bruising after a carpal tunnel release.  Icing and raising (elevating) your wrist may help to lessen swelling and pain.  Call your health care provider if you have a fever or notice any signs  of infection in your incision area. This information is not intended to replace advice given to you by your health care provider. Make sure you discuss any questions you have with your health care provider. Document Released: 11/23/2004 Document Revised: 04/18/2017 Document Reviewed: 01/13/2017 Elsevier Patient Education  2020 [employer] After These instructions provide you with information about caring for yourself after your procedure. Your health care provider may also give you more specific instructions. Your treatment has been planned according to current medical practices, but problems sometimes occur. Call your health care provider if you have any problems or questions after your procedure. What can I expect after the procedure? After your  procedure, you may:  Feel sleepy for several hours.  Feel clumsy and have poor balance for several hours.  Feel forgetful about what happened after the procedure.  Have poor judgment for several hours.  Feel nauseous or vomit.  Have a sore throat if you had a breathing tube during the procedure. Follow these instructions at home: For at least 24 hours after the procedure:      Have a responsible adult stay with you. It is important to have someone help care for you until you are awake and alert.  Rest as needed.  Do not: ? Participate in activities in which you could fall or become injured. ? Drive. ? Use heavy machinery. ? Drink alcohol. ? Take sleeping pills or medicines that cause drowsiness. ? Make important decisions or sign legal documents. ? Take care of children on your own. Eating and drinking  Follow the diet that is recommended by your health care provider.  If you vomit, drink water, juice, or soup when you can drink without vomiting.  Make sure you have little or no nausea before eating solid foods. General instructions  Take over-the-counter and prescription medicines only as  told by your health care provider.  If you have sleep apnea, surgery and certain medicines can increase your risk for breathing problems. Follow instructions from your health care provider about wearing your sleep device: ? Anytime you are sleeping, including during daytime naps. ? While taking prescription pain medicines, sleeping medicines, or medicines that make you drowsy.  If you smoke, do not smoke without supervision.  Keep all follow-up visits as told by your health care provider. This is important. Contact a health care provider if:  You keep feeling nauseous or you keep vomiting.  You feel light-headed.  You develop a rash.  You have a fever. Get help right away if:  You have trouble breathing. Summary  For several hours after your procedure, you may feel sleepy and have poor judgment.  Have a responsible adult stay with you for at least 24 hours or until you are awake and alert. This information is not intended to replace advice given to you by your health care provider. Make sure you discuss any questions you have with your health care provider. Document Released: 08/27/2015 Document Revised: 08/04/2017 Document Reviewed: 08/27/2015 Elsevier Patient Education  2020 [employer]. How to Use Chlorhexidine for Bathing Chlorhexidine gluconate (CHG) is a germ-killing (antiseptic) solution that is used to clean the skin. It can get rid of the bacteria that normally live on the skin and can keep them away for about 24 hours. To clean your skin with CHG, you may be given:  A CHG solution to use in the shower or as part of a sponge bath.  A prepackaged cloth that contains CHG. Cleaning your skin with CHG may help lower the risk for infection:  While you are staying in the intensive care unit of the hospital.  If you have a vascular access, such as a central line, to provide short-term or long-term access to your veins.  If you have a catheter to drain urine from your  bladder.  If you are on a ventilator. A ventilator is a machine that helps you breathe by moving air in and out of your lungs.  After surgery. What are the risks? Risks of using CHG include:  A skin reaction.  Hearing loss, if CHG gets in your ears.  Eye injury, if CHG gets in your eyes and is  not rinsed out.  The CHG product catching fire. Make sure that you avoid smoking and flames after applying CHG to your skin. Do not use CHG:  If you have a chlorhexidine allergy or have previously reacted to chlorhexidine.  On babies younger than 42 months of age. How to use CHG solution  Use CHG only as told by your health care provider, and follow the instructions on the label.  Use the full amount of CHG as directed. Usually, this is one bottle. During a shower Follow these steps when using CHG solution during a shower (unless your health care provider gives you different instructions): 1. Start the shower. 2. Use your normal soap and shampoo to wash your face and hair. 3. Turn off the shower or move out of the shower stream. 4. Pour the CHG onto a clean washcloth. Do not use any type of brush or rough-edged sponge. 5. Starting at your neck, lather your body down to your toes. Make sure you follow these instructions: ? If you will be having surgery, pay special attention to the part of your body where you will be having surgery. Scrub this area for at least 1 minute. ? Do not use CHG on your head or face. If the solution gets into your ears or eyes, rinse them well with water. ? Avoid your genital area. ? Avoid any areas of skin that have broken skin, cuts, or scrapes. ? Scrub your back and under your arms. Make sure to wash skin folds. 6. Let the lather sit on your skin for 1-2 minutes or as long as told by your health care provider. 7. Thoroughly rinse your entire body in the shower. Make sure that all body creases and crevices are rinsed well. 8. Dry off with a clean towel. Do not  put any substances on your body afterward-such as powder, lotion, or perfume-unless you are told to do so by your health care provider. Only use lotions that are recommended by the manufacturer. 9. Put on clean clothes or pajamas. 10. If it is the night before your surgery, sleep in clean sheets.  During a sponge bath Follow these steps when using CHG solution during a sponge bath (unless your health care provider gives you different instructions): 1. Use your normal soap and shampoo to wash your face and hair. 2. Pour the CHG onto a clean washcloth. 3. Starting at your neck, lather your body down to your toes. Make sure you follow these instructions: ? If you will be having surgery, pay special attention to the part of your body where you will be having surgery. Scrub this area for at least 1 minute. ? Do not use CHG on your head or face. If the solution gets into your ears or eyes, rinse them well with water. ? Avoid your genital area. ? Avoid any areas of skin that have broken skin, cuts, or scrapes. ? Scrub your back and under your arms. Make sure to wash skin folds. 4. Let the lather sit on your skin for 1-2 minutes or as long as told by your health care provider. 5. Using a different clean, wet washcloth, thoroughly rinse your entire body. Make sure that all body creases and crevices are rinsed well. 6. Dry off with a clean towel. Do not put any substances on your body afterward-such as powder, lotion, or perfume-unless you are told to do so by your health care provider. Only use lotions that are recommended by the manufacturer. 7. Put on  clean clothes or pajamas. 8. If it is the night before your surgery, sleep in clean sheets. How to use CHG prepackaged cloths  Only use CHG cloths as told by your health care provider, and follow the instructions on the label.  Use the CHG cloth on clean, dry skin.  Do not use the CHG cloth on your head or face unless your health care provider tells  you to.  When washing with the CHG cloth: ? Avoid your genital area. ? Avoid any areas of skin that have broken skin, cuts, or scrapes. Before surgery Follow these steps when using a CHG cloth to clean before surgery (unless your health care provider gives you different instructions): 1. Using the CHG cloth, vigorously scrub the part of your body where you will be having surgery. Scrub using a back-and-forth motion for 3 minutes. The area on your body should be completely wet with CHG when you are done scrubbing. 2. Do not rinse. Discard the cloth and let the area air-dry. Do not put any substances on the area afterward, such as powder, lotion, or perfume. 3. Put on clean clothes or pajamas. 4. If it is the night before your surgery, sleep in clean sheets.  For general bathing Follow these steps when using CHG cloths for general bathing (unless your health care provider gives you different instructions). 1. Use a separate CHG cloth for each area of your body. Make sure you wash between any folds of skin and between your fingers and toes. Wash your body in the following order, switching to a new cloth after each step: ? The front of your neck, shoulders, and chest. ? Both of your arms, under your arms, and your hands. ? Your stomach and groin area, avoiding the genitals. ? Your right leg and foot. ? Your left leg and foot. ? The back of your neck, your back, and your buttocks. 2. Do not rinse. Discard the cloth and let the area air-dry. Do not put any substances on your body afterward-such as powder, lotion, or perfume-unless you are told to do so by your health care provider. Only use lotions that are recommended by the manufacturer. 3. Put on clean clothes or pajamas. Contact a health care provider if:  Your skin gets irritated after scrubbing.  You have questions about using your solution or cloth. Get help right away if:  Your eyes become very red or swollen.  Your eyes itch badly.   Your skin itches badly and is red or swollen.  Your hearing changes.  You have trouble seeing.  You have swelling or tingling in your mouth or throat.  You have trouble breathing.  You swallow any chlorhexidine. Summary  Chlorhexidine gluconate (CHG) is a germ-killing (antiseptic) solution that is used to clean the skin. Cleaning your skin with CHG may help to lower your risk for infection.  You may be given CHG to use for bathing. It may be in a bottle or in a prepackaged cloth to use on your skin. Carefully follow your health care provider's instructions and the instructions on the product label.  Do not use CHG if you have a chlorhexidine allergy.  Contact your health care provider if your skin gets irritated after scrubbing. This information is not intended to replace advice given to you by your health care provider. Make sure you discuss any questions you have with your health care provider. Document Released: 01/29/2012 Document Revised: 07/23/2018 Document Reviewed: 04/03/2017 Elsevier Patient Education  2020 [employer].

## 2018-12-15 ENCOUNTER — Other Ambulatory Visit (HOSPITAL_COMMUNITY)
Admission: RE | Admit: 2018-12-15 | Discharge: 2018-12-15 | Disposition: A | Discharge status: Home / Self Care | Payer: Medicare HMO | Source: Ambulatory Visit | Attending: Orthopedic Surgery | Admitting: Orthopedic Surgery

## 2018-12-15 ENCOUNTER — Other Ambulatory Visit: Payer: Self-pay

## 2018-12-15 ENCOUNTER — Encounter (HOSPITAL_COMMUNITY)
Admission: RE | Admit: 2018-12-15 | Discharge: 2018-12-15 | Disposition: A | Discharge status: Home / Self Care | Payer: Medicare HMO | Source: Ambulatory Visit | Attending: Orthopedic Surgery | Admitting: Orthopedic Surgery

## 2018-12-15 DIAGNOSIS — Z01818 Encounter for other preprocedural examination: Secondary | ICD-10-CM | POA: Insufficient documentation

## 2018-12-15 DIAGNOSIS — F329 Major depressive disorder, single episode, unspecified: Secondary | ICD-10-CM | POA: Diagnosis not present

## 2018-12-15 DIAGNOSIS — Z87891 Personal history of nicotine dependence: Secondary | ICD-10-CM | POA: Diagnosis not present

## 2018-12-15 DIAGNOSIS — I10 Essential (primary) hypertension: Secondary | ICD-10-CM | POA: Diagnosis not present

## 2018-12-15 DIAGNOSIS — E785 Hyperlipidemia, unspecified: Secondary | ICD-10-CM | POA: Diagnosis not present

## 2018-12-15 DIAGNOSIS — Z20828 Contact with and (suspected) exposure to other viral communicable diseases: Secondary | ICD-10-CM | POA: Insufficient documentation

## 2018-12-15 DIAGNOSIS — G5601 Carpal tunnel syndrome, right upper limb: Secondary | ICD-10-CM | POA: Diagnosis present

## 2018-12-15 DIAGNOSIS — J449 Chronic obstructive pulmonary disease, unspecified: Secondary | ICD-10-CM | POA: Diagnosis not present

## 2018-12-15 DIAGNOSIS — Z79899 Other long term (current) drug therapy: Secondary | ICD-10-CM | POA: Diagnosis not present

## 2018-12-15 DIAGNOSIS — K219 Gastro-esophageal reflux disease without esophagitis: Secondary | ICD-10-CM | POA: Diagnosis not present

## 2018-12-15 LAB — CBC WITH DIFFERENTIAL/PLATELET
Abs Immature Granulocytes: 0.05 10*3/uL (ref 0.00–0.07)
Basophils Absolute: 0.1 10*3/uL (ref 0.0–0.1)
Basophils Relative: 1 %
Eosinophils Absolute: 0.3 10*3/uL (ref 0.0–0.5)
Eosinophils Relative: 2 %
HCT: 43.4 % (ref 39.0–52.0)
Hemoglobin: 14.3 g/dL (ref 13.0–17.0)
Immature Granulocytes: 0 %
Lymphocytes Relative: 15 %
Lymphs Abs: 1.8 10*3/uL (ref 0.7–4.0)
MCH: 32.3 pg (ref 26.0–34.0)
MCHC: 32.9 g/dL (ref 30.0–36.0)
MCV: 98 fL (ref 80.0–100.0)
Monocytes Absolute: 1.1 10*3/uL — ABNORMAL HIGH (ref 0.1–1.0)
Monocytes Relative: 9 %
Neutro Abs: 8.6 10*3/uL — ABNORMAL HIGH (ref 1.7–7.7)
Neutrophils Relative %: 73 %
Platelets: 268 10*3/uL (ref 150–400)
RBC: 4.43 MIL/uL (ref 4.22–5.81)
RDW: 12.1 % (ref 11.5–15.5)
WBC: 11.8 10*3/uL — ABNORMAL HIGH (ref 4.0–10.5)
nRBC: 0 % (ref 0.0–0.2)

## 2018-12-15 LAB — BASIC METABOLIC PANEL
Anion gap: 11 (ref 5–15)
BUN: 16 mg/dL (ref 8–23)
CO2: 22 mmol/L (ref 22–32)
Calcium: 9.1 mg/dL (ref 8.9–10.3)
Chloride: 104 mmol/L (ref 98–111)
Creatinine, Ser: 1.01 mg/dL (ref 0.61–1.24)
GFR calc Af Amer: >60 mL/min (ref >60)
GFR calc non Af Amer: >60 mL/min (ref >60)
Glucose, Bld: 113 mg/dL — ABNORMAL HIGH (ref 70–99)
Potassium: 3.9 mmol/L (ref 3.5–5.1)
Sodium: 137 mmol/L (ref 135–145)

## 2018-12-15 LAB — SARS CORONAVIRUS 2 (TAT 6-24 HRS): SARS Coronavirus 2: NEGATIVE

## 2018-12-16 NOTE — H&P (Signed)
John Mote Sr.   12/07/2018   HISTORY SECTION :      Chief Complaint  Patient presents with  . Hand Pain   63 year old male bilateral carpal tunnel syndromes diagnosed clinically and confirmed with EMG nerve conduction studies presents for preop for surgical release   His complaints are pain right hand tingling right hand numbness especially long finger pain over the wrist burning type sensation constant not improving no improvement with nonoperative treatment       Review of Systems  Constitutional: Negative for chills and fever.  Respiratory: Negative for shortness of breath.   Cardiovascular: Negative for chest pain.  Neurological: Positive for tingling and sensory change.            Past Medical History:  Diagnosis Date  . Arthritis    . COPD (chronic obstructive pulmonary disease) ([place])    . Depression    . Essential hypertension    . H/O gastroesophageal reflux (GERD)    . History of bronchitis    . Hyperlipidemia    . Lung nodule      Right upper lobe lung nodule 1.3 cm   . Shortness of breath dyspnea            Past Surgical History:  Procedure Laterality Date  . COLONOSCOPY      . KNEE ARTHROSCOPY WITH MEDIAL MENISECTOMY Right 04/03/2017    Procedure: KNEE ARTHROSCOPY WITH MEDIAL MENISECTOMY;  Surgeon: Carole Civil, MD;  Location: [facility];  Service: Orthopedics;  Laterality: Right;  . LEG SURGERY Left      plate inserted and removed.  . THORACOTOMY Right 09/25/2015    Procedure: MINI/LIMITED THORACOTOMY WITH RIGHT UPPER LOBE WEDGE RESECTION;  Surgeon: Grace Isaac, MD;  Location: [facility];  Service: Thoracic;  Laterality: Right;  . TOOTH EXTRACTION      . VIDEO BRONCHOSCOPY N/A 09/25/2015    Procedure: VIDEO BRONCHOSCOPY;  Surgeon: Grace Isaac, MD;  Location: [facility] OR;  Service: Thoracic;  Laterality: N/A;       No Known Allergies    Current Outpatient Medications:  .  albuterol (PROVENTIL HFA;VENTOLIN HFA) 108 (90 Base) MCG/ACT inhaler,  Inhale 2 puffs every 6 (six) hours as needed into the lungs for wheezing or shortness of breath., Disp: , Rfl:  .  ALPRAZolam (XANAX) 0.5 MG tablet, Take 0.5 mg at bedtime as needed by mouth for anxiety or sleep. , Disp: , Rfl:  .  amLODipine (NORVASC) 10 MG tablet, Take 10 mg by mouth daily., Disp: , Rfl:  .  atorvastatin (LIPITOR) 40 MG tablet, , Disp: , Rfl:  .  HYDROcodone-acetaminophen (NORCO) 7.5-325 MG tablet, Take 1 tablet by mouth every 6 (six) hours as needed for moderate pain (Must last 30 days)., Disp: 95 tablet, Rfl: 0 .  lisinopril-hydrochlorothiazide (PRINZIDE,ZESTORETIC) 20-12.5 MG per tablet, Take 1 tablet by mouth daily., Disp: , Rfl:  .  Omega-3 Fatty Acids (FISH OIL PO), Take 1 capsule daily by mouth., Disp: , Rfl:  .  ondansetron (ZOFRAN) 4 MG tablet, Take 1 tablet (4 mg total) by mouth as needed for nausea or vomiting., Disp: 10 tablet, Rfl: 0 .  pantoprazole (PROTONIX) 40 MG tablet, Take 1 tablet (40 mg total) by mouth daily., Disp: 30 tablet, Rfl: 0 .  pravastatin (PRAVACHOL) 40 MG tablet, Take 40 mg by mouth daily., Disp: , Rfl:  .  triamcinolone cream (KENALOG) 0.1 %, Apply 1 application daily as needed topically (for rash on feet). , Disp: ,  Rfl:  .  meloxicam (MOBIC) 7.5 MG tablet, Take 1 tablet (7.5 mg total) by mouth daily. (Patient not taking: Reported on 12/07/2018), Disp: 30 tablet, Rfl: 2     PHYSICAL EXAM SECTION: 1) Temp 98.1 F (36.7 C)   Ht 5\' 7"  (1.702 m)   Wt 204 lb (92.5 kg)   BMI 31.95 kg/m   Body mass index is 31.95 kg/m. General appearance: Well-developed well-nourished no gross deformities  2) Cardiovascular normal pulse and perfusion  normal color without edema  3) Neurologically deep tendon reflexes are equal and normal,  4) Psychological: Awake alert and oriented x3 mood and affect normal   5) Skin no lacerations or ulcerations no nodularity no palpable masses, no erythema or nodularity   6) Musculoskeletal:      Tenderness over the  carpal tunnel some decreased range of motion in the IP joints of the hand wrist joint is stable muscle tone is normal he has some atrophy in the thenar eminence with decreased sensation in the long index and thumb Skin otherwise intact         MEDICAL DECISION SECTION:  No diagnosis found.    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 Alternative treatment has also been reviewed, questions were encouraged and answered. The postoperative plan is also been reviewed.

## 2018-12-17 ENCOUNTER — Ambulatory Visit (HOSPITAL_COMMUNITY)
Admission: RE | Admit: 2018-12-17 | Discharge: 2018-12-17 | Disposition: A | Discharge status: Home / Self Care | Payer: Medicare HMO | Attending: Orthopedic Surgery | Admitting: Orthopedic Surgery

## 2018-12-17 ENCOUNTER — Encounter (HOSPITAL_COMMUNITY): Payer: Self-pay | Admitting: *Deleted

## 2018-12-17 ENCOUNTER — Ambulatory Visit (HOSPITAL_COMMUNITY): Payer: Medicare HMO | Admitting: Anesthesiology

## 2018-12-17 ENCOUNTER — Encounter (HOSPITAL_COMMUNITY)
Admission: RE | Disposition: A | Discharge status: Home / Self Care | Payer: Self-pay | Source: Home / Self Care | Attending: Orthopedic Surgery

## 2018-12-17 ENCOUNTER — Other Ambulatory Visit: Payer: Self-pay

## 2018-12-17 DIAGNOSIS — F329 Major depressive disorder, single episode, unspecified: Secondary | ICD-10-CM | POA: Insufficient documentation

## 2018-12-17 DIAGNOSIS — I10 Essential (primary) hypertension: Secondary | ICD-10-CM | POA: Insufficient documentation

## 2018-12-17 DIAGNOSIS — Z87891 Personal history of nicotine dependence: Secondary | ICD-10-CM | POA: Insufficient documentation

## 2018-12-17 DIAGNOSIS — E785 Hyperlipidemia, unspecified: Secondary | ICD-10-CM | POA: Insufficient documentation

## 2018-12-17 DIAGNOSIS — Z79899 Other long term (current) drug therapy: Secondary | ICD-10-CM | POA: Insufficient documentation

## 2018-12-17 DIAGNOSIS — K219 Gastro-esophageal reflux disease without esophagitis: Secondary | ICD-10-CM | POA: Insufficient documentation

## 2018-12-17 DIAGNOSIS — J449 Chronic obstructive pulmonary disease, unspecified: Secondary | ICD-10-CM | POA: Insufficient documentation

## 2018-12-17 DIAGNOSIS — G5601 Carpal tunnel syndrome, right upper limb: Secondary | ICD-10-CM | POA: Diagnosis not present

## 2018-12-17 DIAGNOSIS — Z20828 Contact with and (suspected) exposure to other viral communicable diseases: Secondary | ICD-10-CM | POA: Insufficient documentation

## 2018-12-17 HISTORY — PX: CARPAL TUNNEL RELEASE: SHX101

## 2018-12-17 SURGERY — CARPAL TUNNEL RELEASE
Anesthesia: Monitor Anesthesia Care | Laterality: Right

## 2018-12-17 MED ORDER — BUPIVACAINE HCL (PF) 0.5 % IJ SOLN
INTRAMUSCULAR | Status: DC | PRN
  Administered 2018-12-17: 20 mL

## 2018-12-17 MED ORDER — SODIUM CHLORIDE 0.9% FLUSH
INTRAVENOUS | Status: AC
  Filled 2018-12-17: qty 10

## 2018-12-17 MED ORDER — LIDOCAINE HCL (PF) 0.5 % IJ SOLN
INTRAMUSCULAR | Status: AC
  Filled 2018-12-17: qty 50

## 2018-12-17 MED ORDER — MIDAZOLAM HCL 5 MG/5ML IJ SOLN
INTRAMUSCULAR | Status: DC | PRN
  Administered 2018-12-17: 2 mg via INTRAVENOUS

## 2018-12-17 MED ORDER — LIDOCAINE HCL (PF) 0.5 % IJ SOLN
INTRAMUSCULAR | Status: DC | PRN
  Administered 2018-12-17: 50 mL via INTRAVENOUS

## 2018-12-17 MED ORDER — MIDAZOLAM HCL 2 MG/2ML IJ SOLN: 0.5000 mg | Freq: Once | INTRAMUSCULAR | Status: DC | PRN

## 2018-12-17 MED ORDER — CEFAZOLIN SODIUM-DEXTROSE 2-4 GM/100ML-% IV SOLN
2.0000 g | INTRAVENOUS | Status: AC
  Administered 2018-12-17: 08:00:00 2 g via INTRAVENOUS

## 2018-12-17 MED ORDER — LACTATED RINGERS IV SOLN
INTRAVENOUS | Status: DC
  Administered 2018-12-17: 1000 mL via INTRAVENOUS

## 2018-12-17 MED ORDER — CHLORHEXIDINE GLUCONATE 4 % EX LIQD: 60.0000 mL | Freq: Once | CUTANEOUS | Status: DC

## 2018-12-17 MED ORDER — FENTANYL CITRATE (PF) 100 MCG/2ML IJ SOLN
INTRAMUSCULAR | Status: DC | PRN
  Administered 2018-12-17: 25 ug via INTRAVENOUS

## 2018-12-17 MED ORDER — PROMETHAZINE HCL 25 MG/ML IJ SOLN: 6.2500 mg | INTRAMUSCULAR | Status: DC | PRN

## 2018-12-17 MED ORDER — HYDROCODONE-ACETAMINOPHEN 7.5-325 MG PO TABS
1.0000 | ORAL_TABLET | Freq: Once | ORAL | Status: AC | PRN
  Administered 2018-12-17: 1 via ORAL
  Filled 2018-12-17: qty 1

## 2018-12-17 MED ORDER — MIDAZOLAM HCL 2 MG/2ML IJ SOLN
INTRAMUSCULAR | Status: AC
  Filled 2018-12-17: qty 2

## 2018-12-17 MED ORDER — PROPOFOL 10 MG/ML IV BOLUS
INTRAVENOUS | Status: DC | PRN
  Administered 2018-12-17 (×2): 20 mg via INTRAVENOUS

## 2018-12-17 MED ORDER — PROPOFOL 500 MG/50ML IV EMUL
INTRAVENOUS | Status: DC | PRN
  Administered 2018-12-17: 75 ug/kg/min via INTRAVENOUS

## 2018-12-17 MED ORDER — HYDROMORPHONE HCL 1 MG/ML IJ SOLN: 0.2500 mg | INTRAMUSCULAR | Status: DC | PRN

## 2018-12-17 MED ORDER — BUPIVACAINE HCL (PF) 0.5 % IJ SOLN
INTRAMUSCULAR | Status: AC
  Filled 2018-12-17: qty 30

## 2018-12-17 MED ORDER — CEFAZOLIN SODIUM-DEXTROSE 2-4 GM/100ML-% IV SOLN
INTRAVENOUS | Status: AC
  Filled 2018-12-17: qty 100

## 2018-12-17 MED ORDER — FENTANYL CITRATE (PF) 100 MCG/2ML IJ SOLN
INTRAMUSCULAR | Status: AC
  Filled 2018-12-17: qty 2

## 2018-12-17 MED ORDER — PROPOFOL 10 MG/ML IV BOLUS
INTRAVENOUS | Status: AC
  Filled 2018-12-17: qty 20

## 2018-12-17 MED ORDER — 0.9 % SODIUM CHLORIDE (POUR BTL) OPTIME
TOPICAL | Status: DC | PRN
  Administered 2018-12-17: 1000 mL

## 2018-12-17 SURGICAL SUPPLY — 43 items
BANDAGE ELASTIC 3 LF NS (GAUZE/BANDAGES/DRESSINGS) ×2 IMPLANT
BANDAGE ESMARK 4X12 BL STRL LF (DISPOSABLE) ×1 IMPLANT
BLADE SURG 15 STRL LF DISP TIS (BLADE) ×1 IMPLANT
BLADE SURG 15 STRL SS (BLADE) ×1
BNDG COHESIVE 4X5 TAN STRL (GAUZE/BANDAGES/DRESSINGS) ×2 IMPLANT
BNDG ESMARK 4X12 BLUE STRL LF (DISPOSABLE) ×2
BNDG GAUZE ELAST 4 BULKY (GAUZE/BANDAGES/DRESSINGS) ×2 IMPLANT
CHLORAPREP W/TINT 26 (MISCELLANEOUS) ×2 IMPLANT
CLOTH BEACON ORANGE TIMEOUT ST (SAFETY) ×2 IMPLANT
COVER LIGHT HANDLE STERIS (MISCELLANEOUS) ×4 IMPLANT
COVER MAYO STAND STRL (DRAPES) ×1 IMPLANT
COVER WAND RF STERILE (DRAPES) ×2 IMPLANT
CUFF TOURN SGL QUICK 18X4 (TOURNIQUET CUFF) ×2 IMPLANT
DECANTER SPIKE VIAL GLASS SM (MISCELLANEOUS) ×2 IMPLANT
DRAPE HALF SHEET 40X57 (DRAPES) ×1 IMPLANT
DRAPE PROXIMA HALF (DRAPES) ×2 IMPLANT
DRSG XEROFORM 1X8 (GAUZE/BANDAGES/DRESSINGS) ×2 IMPLANT
ELECT NDL TIP 2.8 STRL (NEEDLE) ×1 IMPLANT
ELECT NEEDLE TIP 2.8 STRL (NEEDLE) ×2 IMPLANT
ELECT REM PT RETURN 9FT ADLT (ELECTROSURGICAL) ×2
ELECTRODE REM PT RTRN 9FT ADLT (ELECTROSURGICAL) ×1 IMPLANT
GAUZE PETROLATUM 1 X8 (GAUZE/BANDAGES/DRESSINGS) ×1 IMPLANT
GAUZE SPONGE 4X4 12PLY STRL (GAUZE/BANDAGES/DRESSINGS) ×2 IMPLANT
GLOVE BIOGEL PI IND STRL 7.0 (GLOVE) ×1 IMPLANT
GLOVE BIOGEL PI INDICATOR 7.0 (GLOVE) ×2
GLOVE SKINSENSE NS SZ8.0 LF (GLOVE) ×1
GLOVE SKINSENSE STRL SZ8.0 LF (GLOVE) ×1 IMPLANT
GLOVE SS N UNI LF 8.5 STRL (GLOVE) ×2 IMPLANT
GOWN STRL REUS W/ TWL LRG LVL3 (GOWN DISPOSABLE) ×1 IMPLANT
GOWN STRL REUS W/TWL LRG LVL3 (GOWN DISPOSABLE) ×1
GOWN STRL REUS W/TWL XL LVL3 (GOWN DISPOSABLE) ×2 IMPLANT
KIT TURNOVER KIT A (KITS) ×2 IMPLANT
MANIFOLD NEPTUNE II (INSTRUMENTS) ×2 IMPLANT
NDL HYPO 21X1.5 SAFETY (NEEDLE) ×1 IMPLANT
NEEDLE HYPO 21X1.5 SAFETY (NEEDLE) ×2 IMPLANT
NS IRRIG 1000ML POUR BTL (IV SOLUTION) ×2 IMPLANT
PACK BASIC LIMB (CUSTOM PROCEDURE TRAY) ×2 IMPLANT
PAD ARMBOARD 7.5X6 YLW CONV (MISCELLANEOUS) ×2 IMPLANT
PADDING WEBRIL 4 STERILE (GAUZE/BANDAGES/DRESSINGS) ×1 IMPLANT
POSITIONER HAND ALUMI XLG (MISCELLANEOUS) ×2 IMPLANT
SET BASIN LINEN APH (SET/KITS/TRAYS/PACK) ×2 IMPLANT
SUT ETHILON 3 0 FSL (SUTURE) ×2 IMPLANT
SYR CONTROL 10ML LL (SYRINGE) ×2 IMPLANT

## 2018-12-17 NOTE — Op Note (Signed)
  8:19 AM  PATIENT:  John Mote Sr.  63 y.o. male  PRE-OPERATIVE DIAGNOSIS:  right carpal tunnel syndrome  POST-OPERATIVE DIAGNOSIS:  right carpal tunnel syndrome  PROCEDURE:  Procedure(s): CARPAL TUNNEL RELEASE (Right) - [phone]  SURGEON:  Surgeon(s) and Role:    Carole Civil, MD - Primary  Carpal tunnel release right wrist  Preop diagnosis carpal tunnel syndrome right wrist postop diagnosis same  Procedure open carpal tunnel release right wrist  Surgeon Aline Brochure  Anesthesia regional Bier block  Operative findings compression of the right median nerveoccupied lesions  Indications failure of conservative treatment to relieve pain and paresthesias and numbness and tingling of the right hand.  The patient was identified in the preop area we confirm the surgical site marked as right wrist. Chart update completed. Patient taken to surgery. She had 2 g of Ancef. After establishing a Bier block her arm was prepped with ChloraPrep.  Timeout executed completed and confirmed site.  A straight incision was made over the carpal tunnel in line with the radial border of the ring finger. Blunt dissection was carried out to find the distal aspect of the carpal tunnel. A blunted judgment was passed beneath the carpal tunnel. Sharp incision was then used to release the transverse carpal ligament. The contents of the carpal tunnel were inspected. The median nerve was compressed with slight discoloration.  The wound was irrigated and then closed with 3-0 nylon suture. We injected 10 mL of plain Marcaine on the radial side of the incision  A sterile bandage was applied and the tourniquet was released the color of the hand and capillary refill were normal  The patient was taken to the recovery room in stable condition7/30/2020  TOURNIQUET:   Total Tourniquet Time Documented: Upper Arm (Right) - 30 minutes Total: Upper Arm (Right) - 30 minutes  PHYSICIAN ASSISTANT:   ASSISTANTS:  none   ANESTHESIA:   regional  EBL:  none   BLOOD ADMINISTERED:none  DRAINS: none   LOCAL MEDICATIONS USED:  MARCAINE   20 cc  SPECIMEN:  No Specimen  DISPOSITION OF SPECIMEN:  N/A  COUNTS:  YES  TOURNIQUET:   See anesthesia record   DICTATION: .Dragon Dictation  PLAN OF CARE: Discharge to home after PACU  PATIENT DISPOSITION:  PACU - hemodynamically stable.   Delay start of Pharmacological VTE agent (>24hrs) due to surgical blood loss or risk of bleeding: not applicable

## 2018-12-17 NOTE — Anesthesia Procedure Notes (Signed)
Anesthesia Regional Block: Bier block (IV Regional)   Pre-Anesthetic Checklist: ,, timeout performed, Correct Patient, Correct Site, Correct Laterality, Correct Procedure,, site marked, surgical consent,, at surgeon's request  Laterality: Right     Needles:  Injection technique: Single-shot  Needle Type: Other      Needle Gauge: 22     Additional Needles:   Procedures:,,,,, intact distal pulses, Esmarch exsanguination, single tourniquet utilized,   Nerve Stimulator or Paresthesia:   Additional Responses:  Pulse checked post tourniquet inflation. IV NSL discontinued post injection. Narrative:   Performed by: Personally       

## 2018-12-17 NOTE — Anesthesia Preprocedure Evaluation (Signed)
Anesthesia Evaluation  Patient identified by MRN, date of birth, ID band Patient awake    Reviewed: Allergy & Precautions, NPO status , Patient's Chart, lab work & pertinent test results  Airway Mallampati: I  TM Distance: >3 FB Neck ROM: Full    Dental no notable dental hx. (+) Edentulous Upper, Edentulous Lower   Pulmonary shortness of breath and with exertion, COPD,  COPD inhaler, former smoker,  S/p RUL lobectomy per pt -wasn't Cancer  Uses qday Anoro elipta -states uses rescue inhaler if carrying things -states breathing at baseline    Pulmonary exam normal breath sounds clear to auscultation       Cardiovascular Exercise Tolerance: Good hypertension, Pt. on medications negative cardio ROS Normal cardiovascular examI Rhythm:Regular Rate:Normal     Neuro/Psych Depression negative neurological ROS  negative psych ROS   GI/Hepatic Neg liver ROS, GERD  Medicated and Controlled,  Endo/Other  negative endocrine ROS  Renal/GU negative Renal ROS  negative genitourinary   Musculoskeletal  (+) Arthritis , Osteoarthritis,    Abdominal   Peds negative pediatric ROS (+)  Hematology negative hematology ROS (+)   Anesthesia Other Findings On Norco -for Arm/leg pain  Reproductive/Obstetrics negative OB ROS                             Anesthesia Physical Anesthesia Plan  ASA: II  Anesthesia Plan: MAC and Bier Block and Bier Block-LIDOCAINE ONLY   Post-op Pain Management:    Induction:   PONV Risk Score and Plan: 1 and Propofol infusion, TIVA and Treatment may vary due to age or medical condition  Airway Management Planned: Nasal Cannula and Simple Face Mask  Additional Equipment:   Intra-op Plan:   Post-operative Plan:   Informed Consent:   Plan Discussed with:   Anesthesia Plan Comments: (Plan Full PPE use  Plan Bier Block with MAC/GA vs GETA as needed d/w pt -WTP with same  after Q&A)        Anesthesia Quick Evaluation

## 2018-12-17 NOTE — Anesthesia Postprocedure Evaluation (Signed)
Anesthesia Post Note  Patient: John Mote Sr.  Procedure(s) Performed: CARPAL TUNNEL RELEASE (Right )  Patient location during evaluation: PACU Anesthesia Type: MAC Level of consciousness: awake and alert and oriented Pain management: pain level controlled Vital Signs Assessment: post-procedure vital signs reviewed and stable Respiratory status: spontaneous breathing Cardiovascular status: blood pressure returned to baseline and stable Postop Assessment: no apparent nausea or vomiting Anesthetic complications: no     Last Vitals:  Vitals:   12/17/18 0645 12/17/18 0700  BP: 138/76 132/76  Pulse:    Resp:    Temp:    SpO2:      Last Pain:  Vitals:   12/17/18 0644  TempSrc: Oral  PainSc: 0-No pain                 Jocelyne Reinertsen

## 2018-12-17 NOTE — Interval H&P Note (Signed)
History and Physical Interval Note:  12/17/2018 7:23 AM  John Mote Sr.  has presented today for surgery, with the diagnosis of right carpal tunnel syndrome.  The various methods of treatment have been discussed with the patient and family. After consideration of risks, benefits and other options for treatment, the patient has consented to  Procedure(s): CARPAL TUNNEL RELEASE (Right) as a surgical intervention.  The patient's history has been reviewed, patient examined, no change in status, stable for surgery.  I have reviewed the patient's chart and labs.  Questions were answered to the patient's satisfaction.     Arther Abbott

## 2018-12-17 NOTE — Addendum Note (Signed)
Addendum  created 12/17/18 0918 by Ollen Bowl, CRNA   Intraprocedure Meds edited

## 2018-12-17 NOTE — Brief Op Note (Signed)
  8:19 AM  PATIENT:  John Mote Sr.  63 y.o. male  PRE-OPERATIVE DIAGNOSIS:  right carpal tunnel syndrome  POST-OPERATIVE DIAGNOSIS:  right carpal tunnel syndrome  PROCEDURE:  Procedure(s): CARPAL TUNNEL RELEASE (Right) - [phone]  SURGEON:  Surgeon(s) and Role:    Carole Civil, MD - Primary  Carpal tunnel release right wrist  Preop diagnosis carpal tunnel syndrome right wrist postop diagnosis same  Procedure open carpal tunnel release right wrist  Surgeon Aline Brochure  Anesthesia regional Bier block  Operative findings compression of the right median nerveoccupied lesions  Indications failure of conservative treatment to relieve pain and paresthesias and numbness and tingling of the right hand.  The patient was identified in the preop area we confirm the surgical site marked as right wrist. Chart update completed. Patient taken to surgery. She had 2 g of Ancef. After establishing a Bier block her arm was prepped with ChloraPrep.  Timeout executed completed and confirmed site.  A straight incision was made over the carpal tunnel in line with the radial border of the ring finger. Blunt dissection was carried out to find the distal aspect of the carpal tunnel. A blunted judgment was passed beneath the carpal tunnel. Sharp incision was then used to release the transverse carpal ligament. The contents of the carpal tunnel were inspected. The median nerve was compressed with slight discoloration.  The wound was irrigated and then closed with 3-0 nylon suture. We injected 10 mL of plain Marcaine on the radial side of the incision  A sterile bandage was applied and the tourniquet was released the color of the hand and capillary refill were normal  The patient was taken to the recovery room in stable condition7/30/2020  TOURNIQUET:   Total Tourniquet Time Documented: Upper Arm (Right) - 30 minutes Total: Upper Arm (Right) - 30 minutes  PHYSICIAN ASSISTANT:   ASSISTANTS:  none   ANESTHESIA:   regional  EBL:  none   BLOOD ADMINISTERED:none  DRAINS: none   LOCAL MEDICATIONS USED:  MARCAINE   20 cc  SPECIMEN:  No Specimen  DISPOSITION OF SPECIMEN:  N/A  COUNTS:  YES  TOURNIQUET:   See anesthesia record   DICTATION: .Dragon Dictation  PLAN OF CARE: Discharge to home after PACU  PATIENT DISPOSITION:  PACU - hemodynamically stable.   Delay start of Pharmacological VTE agent (>24hrs) due to surgical blood loss or risk of bleeding: not applicable

## 2018-12-17 NOTE — Brief Op Note (Signed)
  8:19 AM  PATIENT:  John Mote Sr.  63 y.o. male  PRE-OPERATIVE DIAGNOSIS:  right carpal tunnel syndrome  POST-OPERATIVE DIAGNOSIS:  right carpal tunnel syndrome  PROCEDURE:  Procedure(s): CARPAL TUNNEL RELEASE (Right) - [phone]  SURGEON:  Surgeon(s) and Role:    Carole Civil, MD - Primary  Carpal tunnel release right wrist  Preop diagnosis carpal tunnel syndrome right wrist postop diagnosis same  Procedure open carpal tunnel release right wrist  Surgeon Aline Brochure  Anesthesia regional Bier block  Operative findings compression of the right median nerveoccupied lesions  Indications failure of conservative treatment to relieve pain and paresthesias and numbness and tingling of the right hand.  The patient was identified in the preop area we confirm the surgical site marked as right wrist. Chart update completed. Patient taken to surgery. She had 2 g of Ancef. After establishing a Bier block her arm was prepped with ChloraPrep.  Timeout executed completed and confirmed site.  A straight incision was made over the carpal tunnel in line with the radial border of the ring finger. Blunt dissection was carried out to find the distal aspect of the carpal tunnel. A blunted judgment was passed beneath the carpal tunnel. Sharp incision was then used to release the transverse carpal ligament. The contents of the carpal tunnel were inspected. The median nerve was compressed with slight discoloration.  The wound was irrigated and then closed with 3-0 nylon suture. We injected 10 mL of plain Marcaine on the radial side of the incision  A sterile bandage was applied and the tourniquet was released the color of the hand and capillary refill were normal  The patient was taken to the recovery room in stable condition7/30/2020  TOURNIQUET:   Total Tourniquet Time Documented: Upper Arm (Right) - 30 minutes Total: Upper Arm (Right) - 30 minutes  PHYSICIAN ASSISTANT:   ASSISTANTS:  none   ANESTHESIA:   regional  EBL:  none   BLOOD ADMINISTERED:none  DRAINS: none   LOCAL MEDICATIONS USED:  MARCAINE   20 cc  SPECIMEN:  No Specimen  DISPOSITION OF SPECIMEN:  N/A  COUNTS:  YES  TOURNIQUET:   See anesthesia record   DICTATION: .Dragon Dictation  PLAN OF CARE: Discharge to home after PACU  PATIENT DISPOSITION:  PACU - hemodynamically stable.   Delay start of Pharmacological VTE agent (>24hrs) due to surgical blood loss or risk of bleeding: not applicable

## 2018-12-17 NOTE — Transfer of Care (Signed)
Immediate Anesthesia Transfer of Care Note  Patient: John Mote Sr.  Procedure(s) Performed: CARPAL TUNNEL RELEASE (Right )  Patient Location: PACU  Anesthesia Type:MAC  Level of Consciousness: awake and alert   Airway & Oxygen Therapy: Patient Spontanous Breathing  Post-op Assessment: Report given to RN  Post vital signs: Reviewed and stable  Last Vitals:  Vitals Value Taken Time  BP 130/78 12/17/18 0822  Temp    Pulse 71 12/17/18 0825  Resp 17 12/17/18 0825  SpO2 94 % 12/17/18 0825  Vitals shown include unvalidated device data.  Last Pain:  Vitals:   12/17/18 0644  TempSrc: Oral  PainSc: 0-No pain      Patients Stated Pain Goal: 8 (21/22/48 2500)  Complications: No apparent anesthesia complications

## 2018-12-18 ENCOUNTER — Encounter (HOSPITAL_COMMUNITY): Payer: Self-pay | Admitting: Orthopedic Surgery

## 2018-12-23 ENCOUNTER — Other Ambulatory Visit: Payer: Self-pay

## 2018-12-23 ENCOUNTER — Ambulatory Visit (INDEPENDENT_AMBULATORY_CARE_PROVIDER_SITE_OTHER): Payer: Medicare HMO | Admitting: Orthopedic Surgery

## 2018-12-23 VITALS — Temp 97.2°F

## 2018-12-23 DIAGNOSIS — Z9889 Other specified postprocedural states: Secondary | ICD-10-CM

## 2018-12-23 NOTE — Progress Notes (Signed)
  John Bridges is status post right carpal tunnel release   S:   Chief Complaint  Patient presents with  . Follow-up    Post op recheck on CTR 12-17-18.   Patient reports that they're doing well with mild discomfort.  The incision is clean dry and intact with no drainage The dressing was changed.  Patient to return for suture removal ~ POD 12-14;  aug 20 th

## 2018-12-29 ENCOUNTER — Other Ambulatory Visit: Payer: Self-pay | Admitting: Orthopedic Surgery

## 2018-12-29 NOTE — Telephone Encounter (Signed)
Patient requests refill: HYDROcodone-acetaminophen (NORCO) 7.5-325 MG tablet 95 tablet  States Dr Aline Brochure had him taking 1 1/2 tablets, and was told to call when runs out  - [employer], [place] [address], [place]

## 2018-12-30 ENCOUNTER — Telehealth: Payer: Self-pay

## 2018-12-30 MED ORDER — HYDROCODONE-ACETAMINOPHEN 7.5-325 MG PO TABS: 1.0000 | ORAL_TABLET | Freq: Four times a day (QID) | ORAL | 0 refills | Status: DC | PRN

## 2018-12-30 NOTE — Telephone Encounter (Signed)
Patient called stating that Dr. Aline Brochure had told him he would refill his Hydrocodone-Acetaminophen 75/325 mg  Qty 95 Tablets. Dr. Aline Brochure did send it to [facility] in [place], per patient pharmacist states a few days early and will fill it if Dr. Aline Brochure approves it. I spoke with Dr. Aline Brochure and he said he would ok it, I told patient to call [facility] and tell pharmacist to call the office and ask for me. I also told patient that I had to speak with pharmacist. Pharmacist (Joshua)called and I told him that Dr. Aline Brochure did ok for early refill for this patient.

## 2019-01-07 ENCOUNTER — Ambulatory Visit (INDEPENDENT_AMBULATORY_CARE_PROVIDER_SITE_OTHER): Payer: Medicare HMO | Admitting: Orthopedic Surgery

## 2019-01-07 ENCOUNTER — Ambulatory Visit: Payer: Medicare HMO | Admitting: Orthopaedic Surgery

## 2019-01-07 ENCOUNTER — Other Ambulatory Visit: Payer: Self-pay

## 2019-01-07 VITALS — BP 120/70 | HR 90 | Temp 98.8°F | Ht 68.0 in | Wt 206.0 lb

## 2019-01-07 DIAGNOSIS — Z9889 Other specified postprocedural states: Secondary | ICD-10-CM

## 2019-01-07 NOTE — Progress Notes (Signed)
Chief Complaint  Patient presents with  . Follow-up    Recheck on right CTR, DOS 12-17-18.   John Bridges is 63 years old he is 3 weeks out of surgery 20 days had a carpal tunnel release on the right he has some numbness at the tips of his fingertips his wound looks clean with some redness from the skin tensioning on the suture line  I took the sutures out this relieves some of the redness except at the proximal end of the wound  I need to see him in a week to recheck the wound  He has appropriate pain medication which he uses on a chronic basis  Encounter Diagnosis  Name Primary?  . S/P carpal tunnel release Yes

## 2019-01-14 ENCOUNTER — Other Ambulatory Visit: Payer: Self-pay

## 2019-01-14 ENCOUNTER — Ambulatory Visit (INDEPENDENT_AMBULATORY_CARE_PROVIDER_SITE_OTHER): Payer: Medicare HMO | Admitting: Orthopedic Surgery

## 2019-01-14 VITALS — BP 135/77 | HR 88 | Temp 97.9°F | Ht 68.0 in | Wt 206.0 lb

## 2019-01-14 DIAGNOSIS — Z9889 Other specified postprocedural states: Secondary | ICD-10-CM

## 2019-01-14 NOTE — Progress Notes (Signed)
Chief Complaint  Patient presents with  . Follow-up    Recheck on CTR, right, DOS 12-17-18.   63 year old 27 days after carpal tunnel release his wound is healed  However he still exhibiting some numbness and tingling from his DIP joints distally  He has regained full range of motion  We were worried about this not relieving his symptoms because of the nature of his carpal tunnel compression  Recommend 38-month follow-up continue active range of motion exercises  Encounter Diagnosis  Name Primary?  . Status post carpal tunnel release Yes

## 2019-02-02 ENCOUNTER — Other Ambulatory Visit: Payer: Self-pay | Admitting: Orthopedic Surgery

## 2019-02-02 MED ORDER — HYDROCODONE-ACETAMINOPHEN 7.5-325 MG PO TABS: 1.0000 | ORAL_TABLET | Freq: Four times a day (QID) | ORAL | 0 refills | Status: DC | PRN

## 2019-02-02 NOTE — Telephone Encounter (Signed)
Patient called for refill: HYDROcodone-acetaminophen (NORCO) 7.5-325 MG tablet 95 tablet  -if approved, please send to [facility] on [address], [place], [place]

## 2019-02-11 ENCOUNTER — Telehealth: Payer: Self-pay | Admitting: Internal Medicine

## 2019-02-11 NOTE — Telephone Encounter (Signed)
Call returned to patient wife John Bridges (dpr), she reports someone called and told them that her husband had a positive covid. I made her aware that I do not see a Covid test that was ordered by our office. I made her aware the last test as negative that was order by Dr. Aline Brochure. I made her aware we did not order any recent covid tests so she needs to be sure to track down whoever ordered the test so they are aware of further recommendations. Voiced understanding. Nothing further needed at this time.

## 2019-02-11 NOTE — Telephone Encounter (Signed)
Returned call to patient. He says someone from our office called and told his wife his covid test was positive Informed not sure who called but per 12/15/18 lab for covid was negative. Nothing further needed.

## 2019-03-03 ENCOUNTER — Telehealth: Payer: Self-pay | Admitting: Orthopedic Surgery

## 2019-03-03 MED ORDER — HYDROCODONE-ACETAMINOPHEN 7.5-325 MG PO TABS: 1.0000 | ORAL_TABLET | Freq: Four times a day (QID) | ORAL | 0 refills | Status: DC | PRN

## 2019-03-03 NOTE — Telephone Encounter (Signed)
Patient requests refill:  HYDROcodone-acetaminophen (NORCO) 7.5-325 MG tablet 95 tablet  -[facility],[place]

## 2019-03-17 ENCOUNTER — Ambulatory Visit (INDEPENDENT_AMBULATORY_CARE_PROVIDER_SITE_OTHER): Payer: Medicare HMO | Admitting: Orthopedic Surgery

## 2019-03-17 ENCOUNTER — Other Ambulatory Visit: Payer: Self-pay

## 2019-03-17 VITALS — BP 137/82 | HR 84 | Temp 98.2°F | Ht 68.0 in | Wt 208.0 lb

## 2019-03-17 DIAGNOSIS — Z9889 Other specified postprocedural states: Secondary | ICD-10-CM

## 2019-03-17 NOTE — Progress Notes (Signed)
Chief Complaint  Patient presents with  . Follow-up    Recheck of [place], DOS 12-17-18.   Mr. John Bridges is 3 months after his right carpal tunnel release he has a little numbness in his index finger his wound healed up nicely  He has limited range of motion due to arthritis in his IP joints but overall his preop range of motion has returned to normal  He is released from his carpal tunnel  Encounter Diagnosis  Name Primary?  . Status post carpal tunnel release Yes

## 2019-04-01 ENCOUNTER — Other Ambulatory Visit: Payer: Self-pay

## 2019-04-05 MED ORDER — HYDROCODONE-ACETAMINOPHEN 7.5-325 MG PO TABS: 1.0000 | ORAL_TABLET | Freq: Four times a day (QID) | ORAL | 0 refills | Status: DC | PRN

## 2019-04-08 ENCOUNTER — Other Ambulatory Visit: Payer: Self-pay

## 2019-04-08 ENCOUNTER — Encounter: Payer: Self-pay | Admitting: Orthopaedic Surgery

## 2019-04-08 ENCOUNTER — Ambulatory Visit: Payer: Medicare HMO | Admitting: Orthopaedic Surgery

## 2019-04-08 VITALS — BP 132/71 | HR 95 | Temp 98.1°F | Ht 68.0 in | Wt 210.0 lb

## 2019-04-08 DIAGNOSIS — M79641 Pain in right hand: Secondary | ICD-10-CM

## 2019-04-08 DIAGNOSIS — M79642 Pain in left hand: Secondary | ICD-10-CM | POA: Diagnosis not present

## 2019-04-08 NOTE — Patient Instructions (Signed)
Use Aspercreme, Biofreeze or Voltaren gel over the counter 2-3 times daily make sure you rub it in well each time you use it.   Use paraffin wax to hands daily, this will help with your hand pain use a ziploc bag over your hands after you dip hands in wax, so it does not drip on your furniture (you can get the paraffin wax at [facility])

## 2019-04-08 NOTE — Progress Notes (Signed)
Patient RO:[id number] L [name], male DOB:1955-08-26, 63 y.o. [id number]  Chief Complaint  Patient presents with  . Shoulder Pain  . Hand Pain    HPI  John Bridges Sr. is a 63 y.o. male who has developed pain in both hands and some swelling more in the early morning.  It gets better over time or if he gets his hand under warm water.  He has slight redness at times, he has no trauma.  He has done well from the carpal tunnel surgery.   Body mass index is 31.93 kg/m.  ROS  Review of Systems  Constitutional:       Patient does not have Diabetes Mellitus. Patient has hypertension. Patient has COPD or shortness of breath. Patient does not have BMI > 35. Patient has current smoking history.  HENT: Negative for congestion.   Respiratory: Positive for shortness of breath. Negative for cough.   Cardiovascular: Negative for chest pain and leg swelling.  Endocrine: Negative for cold intolerance.  Musculoskeletal: Positive for arthralgias, gait problem, joint swelling and myalgias.  Allergic/Immunologic: Negative for environmental allergies.  All other systems reviewed and are negative.   All other systems reviewed and are negative.  The following is a summary of the past history medically, past history surgically, known current medicines, social history and family history.  This information is gathered electronically by the computer from prior information and documentation.  I review this each visit and have found including this information at this point in the chart is beneficial and informative.    Past Medical History:  Diagnosis Date  . Arthritis   . COPD (chronic obstructive pulmonary disease) ([place])   . Depression   . Essential hypertension   . H/O gastroesophageal reflux (GERD)   . History of bronchitis   . Hyperlipidemia   . Lung nodule    Right upper lobe lung nodule 1.3 cm   . Shortness of breath dyspnea     Past Surgical History:  Procedure Laterality Date  .  CARPAL TUNNEL RELEASE Right 12/17/2018   Procedure: CARPAL TUNNEL RELEASE;  Surgeon: Carole Civil, MD;  Location: [facility];  Service: Orthopedics;  Laterality: Right;  . COLONOSCOPY    . KNEE ARTHROSCOPY WITH MEDIAL MENISECTOMY Right 04/03/2017   Procedure: KNEE ARTHROSCOPY WITH MEDIAL MENISECTOMY;  Surgeon: Carole Civil, MD;  Location: [facility];  Service: Orthopedics;  Laterality: Right;  . LEG SURGERY Left    plate inserted and removed.  . THORACOTOMY Right 09/25/2015   Procedure: MINI/LIMITED THORACOTOMY WITH RIGHT UPPER LOBE WEDGE RESECTION;  Surgeon: Grace Isaac, MD;  Location: [facility];  Service: Thoracic;  Laterality: Right;  . TOOTH EXTRACTION    . VIDEO BRONCHOSCOPY N/A 09/25/2015   Procedure: VIDEO BRONCHOSCOPY;  Surgeon: Grace Isaac, MD;  Location: [facility] OR;  Service: Thoracic;  Laterality: N/A;    Family History  Problem Relation Age of Onset  . Hypertension Mother   . Hypertension Father   . Cancer Brother   . CAD Brother   . Stroke Brother     Social History Social History   Tobacco Use  . Smoking status: Former Smoker    Packs/day: 1.00    Years: 40.00    Pack years: 40.00    Types: Cigarettes    Quit date: 08/16/2015    Years since quitting: 3.6  . Smokeless tobacco: Former Network engineer Use Topics  . Alcohol use: Yes    Alcohol/week: 0.0 standard drinks  Comment: 3-4 times a week  . Drug use: No    No Known Allergies  Current Outpatient Medications  Medication Sig Dispense Refill  . albuterol (PROVENTIL HFA;VENTOLIN HFA) 108 (90 Base) MCG/ACT inhaler Inhale 2 puffs every 6 (six) hours as needed into the lungs for wheezing or shortness of breath.    . ALPRAZolam (XANAX) 0.5 MG tablet Take 0.5 mg at bedtime as needed by mouth for anxiety or sleep.     Marland Kitchen amLODipine (NORVASC) 10 MG tablet Take 10 mg by mouth daily.    Marland Kitchen atorvastatin (LIPITOR) 40 MG tablet Take 40 mg by mouth every morning.     . Homeopathic Products (LEG CRAMP RELIEF PO)  Take 2 tablets by mouth 2 (two) times a day.    Marland Kitchen HYDROcodone-acetaminophen (NORCO) 7.5-325 MG tablet Take 1 tablet by mouth every 6 (six) hours as needed for moderate pain (Must last 30 days). 95 tablet 0  . lisinopril-hydrochlorothiazide (PRINZIDE,ZESTORETIC) 20-12.5 MG per tablet Take 1 tablet by mouth daily.    . Omega-3 Fatty Acids (FISH OIL PO) Take 1 capsule daily by mouth.    . ondansetron (ZOFRAN) 4 MG tablet Take 1 tablet (4 mg total) by mouth as needed for nausea or vomiting. 10 tablet 0  . pantoprazole (PROTONIX) 40 MG tablet Take 1 tablet (40 mg total) by mouth daily. 30 tablet 0  . Potassium 99 MG TABS Take 99 mg by mouth every morning.    . triamcinolone cream (KENALOG) 0.1 % Apply 1 application daily as needed topically (for rash on feet).     Marland Kitchen umeclidinium-vilanterol (ANORO ELLIPTA) 62.5-25 MCG/INH AEPB Inhale 1 puff into the lungs daily.     No current facility-administered medications for this visit.      Physical Exam  Blood pressure 132/71, pulse 95, temperature 98.1 F (36.7 C), height 5\' 8"  (1.727 m), weight 210 lb (95.3 kg).  Constitutional: overall normal hygiene, normal nutrition, well developed, normal grooming, normal body habitus. Assistive device:none  Musculoskeletal: gait and station Limp none, muscle tone and strength are normal, no tremors or atrophy is present.  .  Neurological: coordination overall normal.  Deep tendon reflex/nerve stretch intact.  Sensation normal.  Cranial nerves II-XII intact.   Skin:   Normal overall no scars, lesions, ulcers or rashes. No psoriasis.  Psychiatric: Alert and oriented x 3.  Recent memory intact, remote memory unclear.  Normal mood and affect. Well groomed.  Good eye contact.  Cardiovascular: overall no swelling, no varicosities, no edema bilaterally, normal temperatures of the legs and arms, no clubbing, cyanosis and good capillary refill.  Lymphatic: palpation is normal.  Both hand slightly tender over the MCP  joints but no swelling or numbness is present.  NV intact. ROM is full.   All other systems reviewed and are negative   The patient has been educated about the nature of the problem(s) and counseled on treatment options.  The patient appeared to understand what I have discussed and is in agreement with it.  Encounter Diagnosis  Name Primary?  . Bilateral hand pain Yes    PLAN Call if any problems.  Precautions discussed.  Continue current medications.   Return to clinic 2 months   I told him he has signs of arthritis of the hands.  I have recommended Aspercreme, Voltaren Gel and he should consider getting a paraffin bath.  Electronically Signed Sanjuana Kava, MD 11/19/202010:24 AM

## 2019-04-27 ENCOUNTER — Other Ambulatory Visit: Payer: Self-pay | Admitting: Radiology

## 2019-04-27 MED ORDER — HYDROCODONE-ACETAMINOPHEN 7.5-325 MG PO TABS: 1.0000 | ORAL_TABLET | Freq: Four times a day (QID) | ORAL | 0 refills | Status: DC | PRN

## 2019-04-27 NOTE — Telephone Encounter (Signed)
Rx refill requested

## 2019-04-27 NOTE — Telephone Encounter (Signed)
Dr Raliegh Ip is Rx ing his pain meds

## 2019-04-27 NOTE — Telephone Encounter (Signed)
I released him from carpal tunnel surgery. Pain meds are for chronic pain

## 2019-04-27 NOTE — Telephone Encounter (Signed)
Patient called, and asked if you could write a note stating he would be the 6-8 months until he is fully healed/regains full function of hand.  He says you all discussed this.   ' He is also asking for a refill of pain medication.  [facility] [employer].

## 2019-04-27 NOTE — Telephone Encounter (Signed)
See below, patient says Dr Aline Brochure told him it would be "X" amount of months before he was at full capacity as far as the wrist/hand.  Please call him and advised if such note can be written for him.

## 2019-05-31 ENCOUNTER — Other Ambulatory Visit: Payer: Self-pay

## 2019-06-01 MED ORDER — HYDROCODONE-ACETAMINOPHEN 7.5-325 MG PO TABS: 1.0000 | ORAL_TABLET | Freq: Four times a day (QID) | ORAL | 0 refills | Status: DC | PRN

## 2019-06-08 ENCOUNTER — Encounter: Payer: Self-pay | Admitting: Orthopaedic Surgery

## 2019-06-08 ENCOUNTER — Other Ambulatory Visit: Payer: Self-pay

## 2019-06-08 ENCOUNTER — Ambulatory Visit (INDEPENDENT_AMBULATORY_CARE_PROVIDER_SITE_OTHER): Payer: Medicare HMO | Admitting: Orthopaedic Surgery

## 2019-06-08 VITALS — BP 131/80 | HR 87 | Temp 98.2°F | Ht 69.0 in | Wt 214.0 lb

## 2019-06-08 DIAGNOSIS — M79641 Pain in right hand: Secondary | ICD-10-CM

## 2019-06-08 DIAGNOSIS — M1711 Unilateral primary osteoarthritis, right knee: Secondary | ICD-10-CM | POA: Diagnosis not present

## 2019-06-08 DIAGNOSIS — M79642 Pain in left hand: Secondary | ICD-10-CM

## 2019-06-08 NOTE — Progress Notes (Signed)
Patient RO:[id number] John [name], male DOB:1955/10/21, 64 y.o. [id number]  Chief Complaint  Patient presents with  . Hand Pain    Bilat/doing better    HPI  John RISINGER Sr. is a 64 y.o. male who has bilateral hand pain post carpal tunnel release.  He is wearing gloves which help.  His right knee pain is hurting at times,more with the cold weather. He has no new trauma, no giving way, no redness.  He is taking his medicine.   Body mass index is 31.6 kg/m.  ROS  Review of Systems  Constitutional:       Patient does not have Diabetes Mellitus. Patient has hypertension. Patient has COPD or shortness of breath. Patient does not have BMI > 35. Patient has current smoking history.  HENT: Negative for congestion.   Respiratory: Positive for shortness of breath. Negative for cough.   Cardiovascular: Negative for chest pain and leg swelling.  Endocrine: Negative for cold intolerance.  Musculoskeletal: Positive for arthralgias, gait problem, joint swelling and myalgias.  Allergic/Immunologic: Negative for environmental allergies.  All other systems reviewed and are negative.   All other systems reviewed and are negative.  The following is a summary of the past history medically, past history surgically, known current medicines, social history and family history.  This information is gathered electronically by the computer from prior information and documentation.  I review this each visit and have found including this information at this point in the chart is beneficial and informative.    Past Medical History:  Diagnosis Date  . Arthritis   . COPD (chronic obstructive pulmonary disease) ([place])   . Depression   . Essential hypertension   . H/O gastroesophageal reflux (GERD)   . History of bronchitis   . Hyperlipidemia   . Lung nodule    Right upper lobe lung nodule 1.3 cm   . Shortness of breath dyspnea     Past Surgical History:  Procedure Laterality Date  . CARPAL TUNNEL  RELEASE Right 12/17/2018   Procedure: CARPAL TUNNEL RELEASE;  Surgeon: John Civil, MD;  Location: [facility];  Service: Orthopedics;  Laterality: Right;  . COLONOSCOPY    . KNEE ARTHROSCOPY WITH MEDIAL MENISECTOMY Right 04/03/2017   Procedure: KNEE ARTHROSCOPY WITH MEDIAL MENISECTOMY;  Surgeon: John Civil, MD;  Location: [facility];  Service: Orthopedics;  Laterality: Right;  . LEG SURGERY Left    plate inserted and removed.  . THORACOTOMY Right 09/25/2015   Procedure: MINI/LIMITED THORACOTOMY WITH RIGHT UPPER LOBE WEDGE RESECTION;  Surgeon: Grace Isaac, MD;  Location: [place];  Service: Thoracic;  Laterality: Right;  . TOOTH EXTRACTION    . VIDEO BRONCHOSCOPY N/A 09/25/2015   Procedure: VIDEO BRONCHOSCOPY;  Surgeon: Grace Isaac, MD;  Location: [facility] OR;  Service: Thoracic;  Laterality: N/A;    Family History  Problem Relation Age of Onset  . Hypertension Mother   . Hypertension Father   . Cancer Brother   . CAD Brother   . Stroke Brother     Social History Social History   Tobacco Use  . Smoking status: Former Smoker    Packs/day: 1.00    Years: 40.00    Pack years: 40.00    Types: Cigarettes    Quit date: 08/16/2015    Years since quitting: 3.8  . Smokeless tobacco: Former Network engineer Use Topics  . Alcohol use: Yes    Alcohol/week: 0.0 standard drinks    Comment: 3-4 times a  week  . Drug use: No    No Known Allergies  Current Outpatient Medications  Medication Sig Dispense Refill  . albuterol (PROVENTIL HFA;VENTOLIN HFA) 108 (90 Base) MCG/ACT inhaler Inhale 2 puffs every 6 (six) hours as needed into the lungs for wheezing or shortness of breath.    . ALPRAZolam (XANAX) 0.5 MG tablet Take 0.5 mg at bedtime as needed by mouth for anxiety or sleep.     Marland Kitchen amLODipine (NORVASC) 10 MG tablet Take 10 mg by mouth daily.    Marland Kitchen atorvastatin (LIPITOR) 40 MG tablet Take 40 mg by mouth every morning.     . Homeopathic Products (LEG CRAMP RELIEF PO) Take 2 tablets  by mouth 2 (two) times a day.    Marland Kitchen HYDROcodone-acetaminophen (NORCO) 7.5-325 MG tablet Take 1 tablet by mouth every 6 (six) hours as needed for moderate pain (Must last 30 days). 95 tablet 0  . lisinopril-hydrochlorothiazide (PRINZIDE,ZESTORETIC) 20-12.5 MG per tablet Take 1 tablet by mouth daily.    . Omega-3 Fatty Acids (FISH OIL PO) Take 1 capsule daily by mouth.    . ondansetron (ZOFRAN) 4 MG tablet Take 1 tablet (4 mg total) by mouth as needed for nausea or vomiting. 10 tablet 0  . pantoprazole (PROTONIX) 40 MG tablet Take 1 tablet (40 mg total) by mouth daily. 30 tablet 0  . Potassium 99 MG TABS Take 99 mg by mouth every morning.    . triamcinolone cream (KENALOG) 0.1 % Apply 1 application daily as needed topically (for rash on feet).     Marland Kitchen umeclidinium-vilanterol (ANORO ELLIPTA) 62.5-25 MCG/INH AEPB Inhale 1 puff into the lungs daily.     No current facility-administered medications for this visit.     Physical Exam  Blood pressure 131/80, pulse 87, temperature 98.2 F (36.8 C), height 5\' 9"  (1.753 m), weight 214 lb (97.1 kg).  Constitutional: overall normal hygiene, normal nutrition, well developed, normal grooming, normal body habitus. Assistive device:none  Musculoskeletal: gait and station Limp right, muscle tone and strength are normal, no tremors or atrophy is present.  .  Neurological: coordination overall normal.  Deep tendon reflex/nerve stretch intact.  Sensation normal.  Cranial nerves II-XII intact.   Skin:   Normal overall no scars, lesions, ulcers or rashes. No psoriasis.  Psychiatric: Alert and oriented x 3.  Recent memory intact, remote memory unclear.  Normal mood and affect. Well groomed.  Good eye contact.  Cardiovascular: overall no swelling, no varicosities, no edema bilaterally, normal temperatures of the legs and arms, no clubbing, cyanosis and good capillary refill.  Lymphatic: palpation is normal.  All other systems reviewed and are negative   Right  knee is tender, ROM 0 to 110, crepitus, limp right.  Stable.  NV intact.  The patient has been educated about the nature of the problem(s) and counseled on treatment options.  The patient appeared to understand what I have discussed and is in agreement with it.  Encounter Diagnoses  Name Primary?  . Bilateral hand pain Yes  . Primary osteoarthritis of right knee     PLAN Call if any problems.  Precautions discussed.  Continue current medications.   Return to clinic 3 months   Electronically Signed Sanjuana Kava, MD 1/19/202110:30 AM

## 2019-06-28 ENCOUNTER — Other Ambulatory Visit: Payer: Self-pay | Admitting: Orthopaedic Surgery

## 2019-06-29 MED ORDER — HYDROCODONE-ACETAMINOPHEN 7.5-325 MG PO TABS: 1.0000 | ORAL_TABLET | Freq: Four times a day (QID) | ORAL | 0 refills | Status: DC | PRN

## 2019-07-18 IMAGING — MR MR KNEE*R* W/O CM
4 of 6 series · 13 of 40 positions shown · non-contrast
Comparison: 10/16/2016

CLINICAL DATA: Right knee pain and swelling for 4 months.

EXAM:
MRI OF THE RIGHT KNEE WITHOUT CONTRAST
TECHNIQUE: Multiplanar, multisequence MR imaging of the knee was performed. No
intravenous contrast was administered.

[Series 3: pdfs axial · axial · 3.0mm · 0.20mm/px · z∈[-73,-1]mm · 3 of 30 slices shown]
[im 5/30]
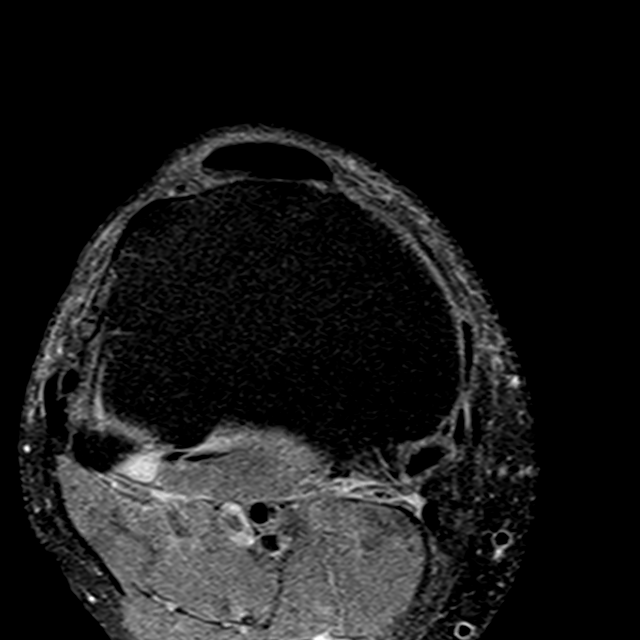
[im 15/30]
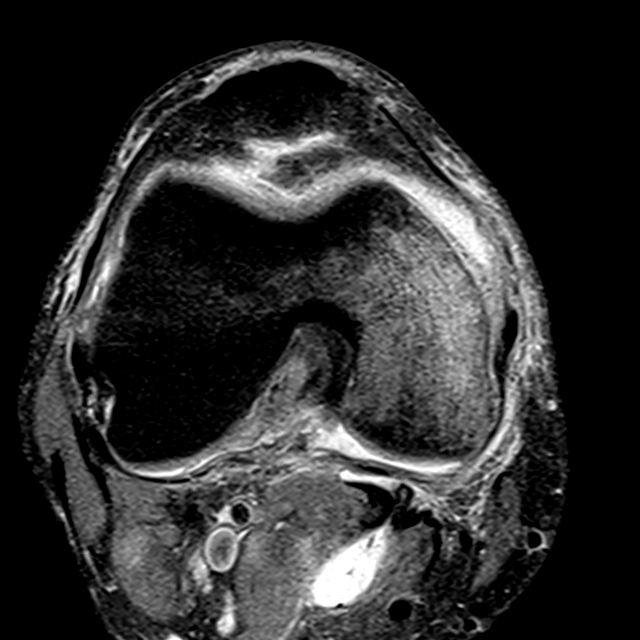
[im 25/30]
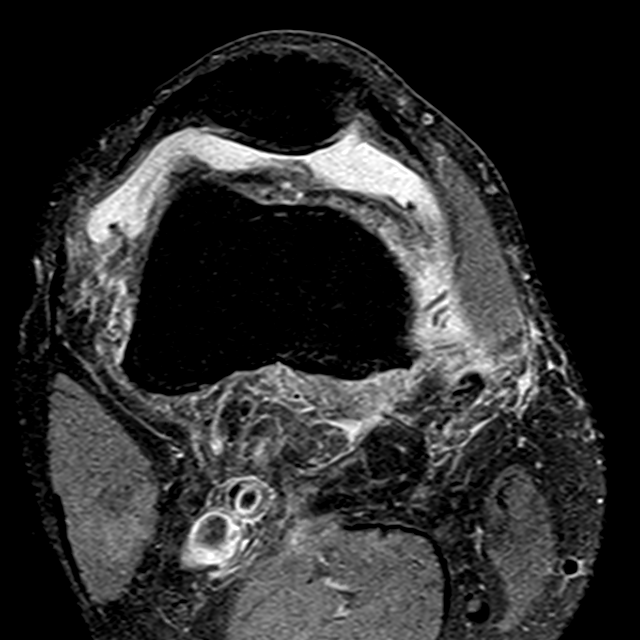

[Series 4: T1 · coronal · 3.0mm · 0.21mm/px · 4 of 26 slices shown]
[im 1/26]
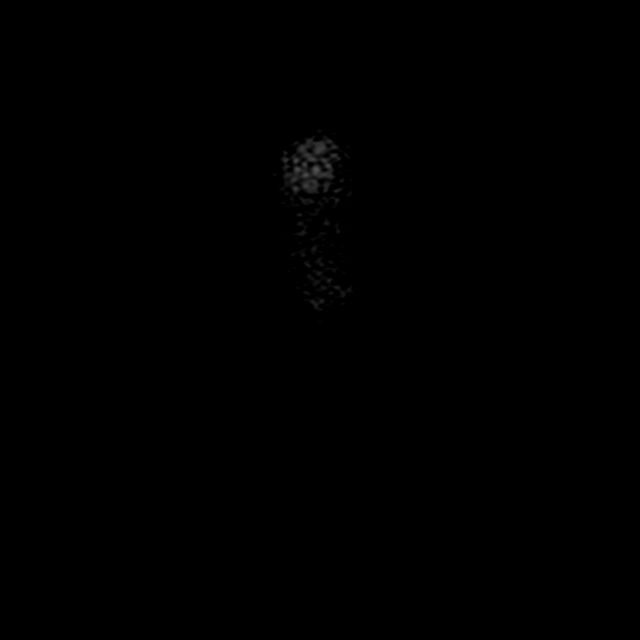
[im 5/26]
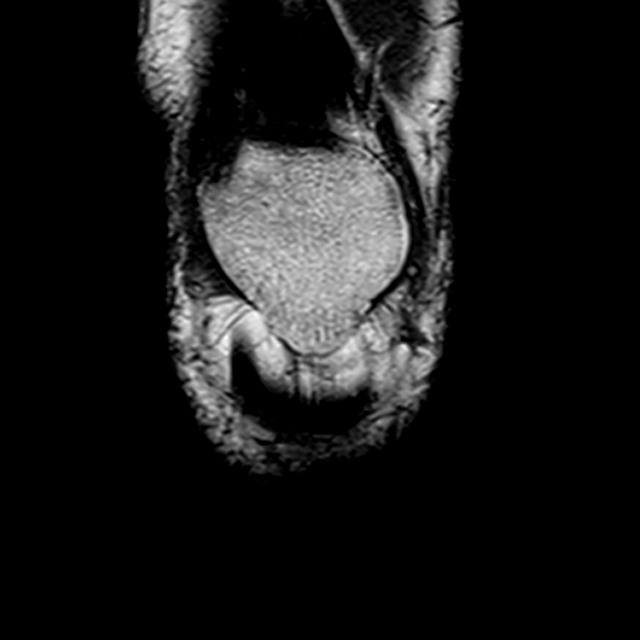
[im 13/26]
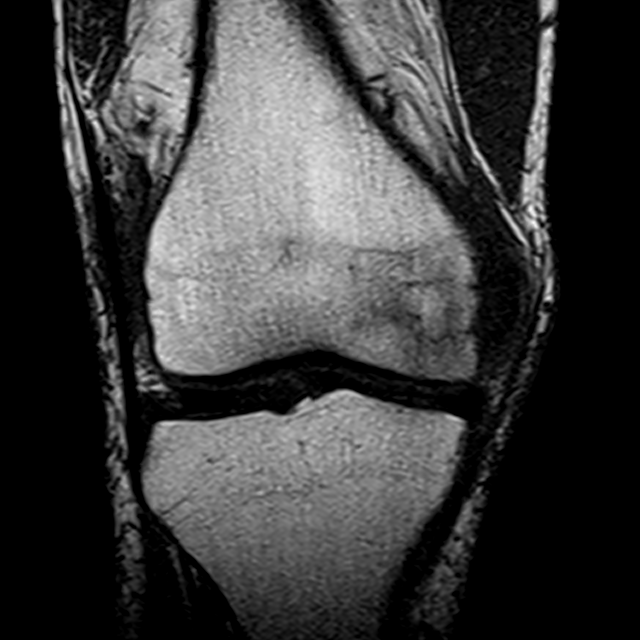
[im 21/26]
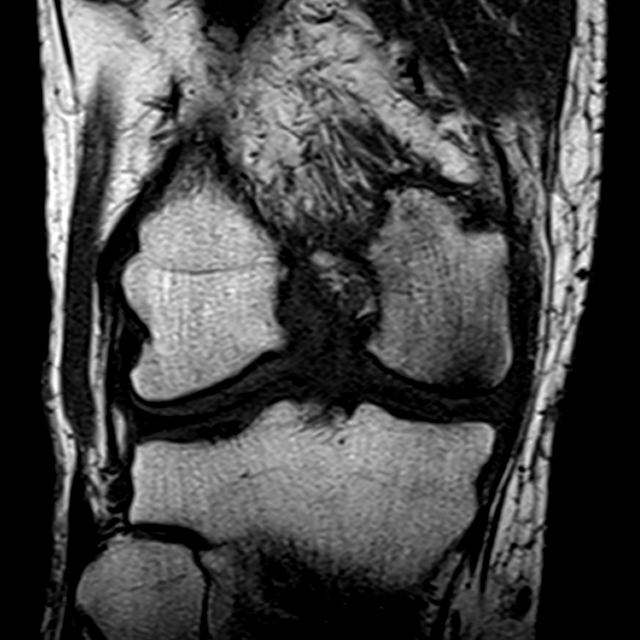

[Series 5: pdfs sag · sagittal · 3.0mm · 0.21mm/px · 3 of 29 slices shown]
[im 5/29]
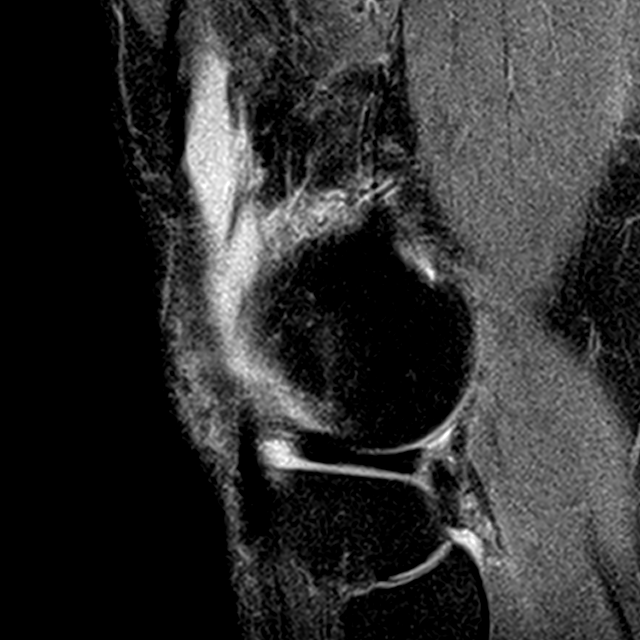
[im 17/29]
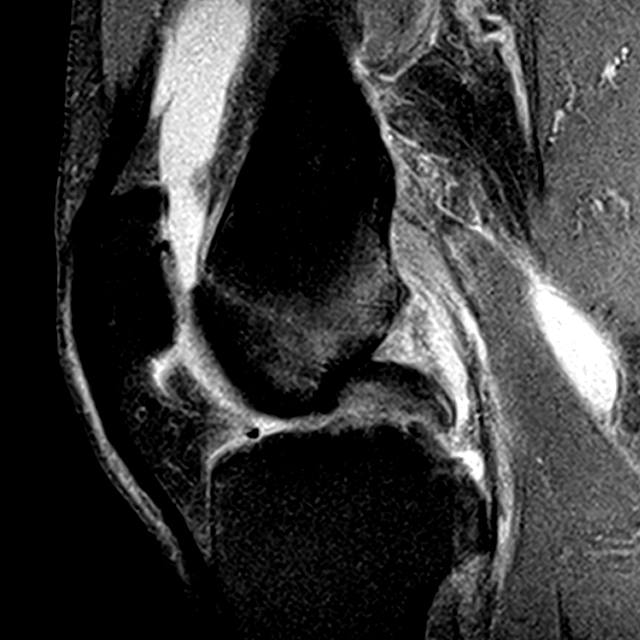
[im 25/29]
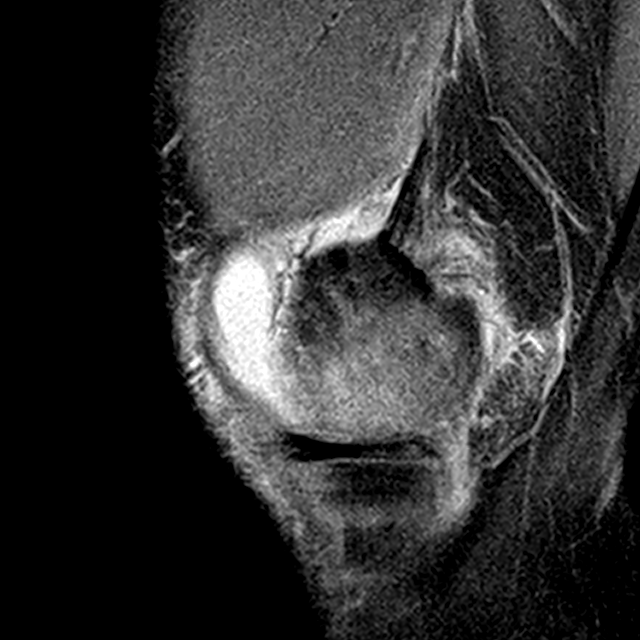

[Series 6: t2fs cor · coronal · 3.0mm · 0.21mm/px · 3 of 26 slices shown]
[im 5/26]
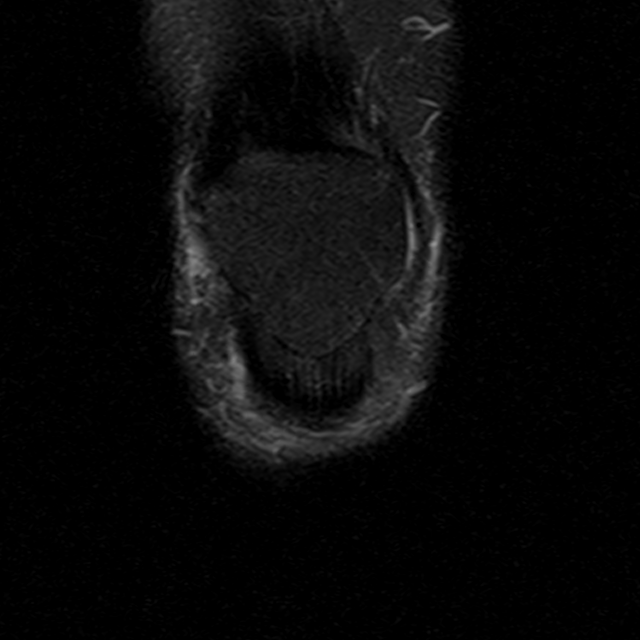
[im 13/26]
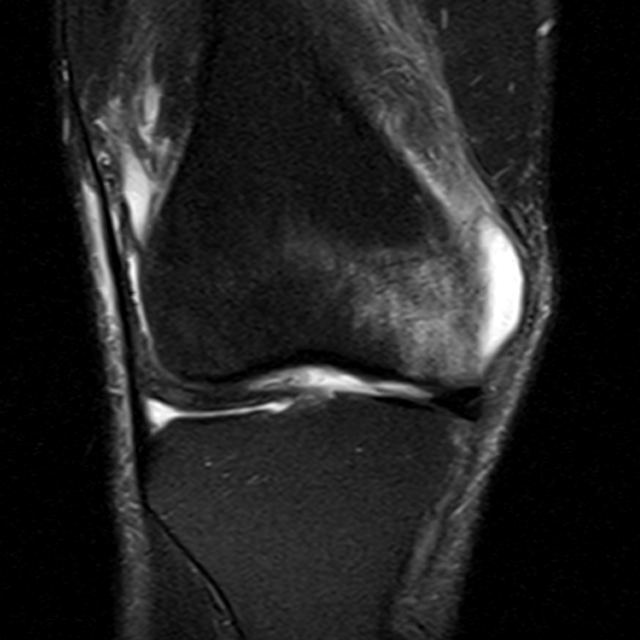
[im 21/26]
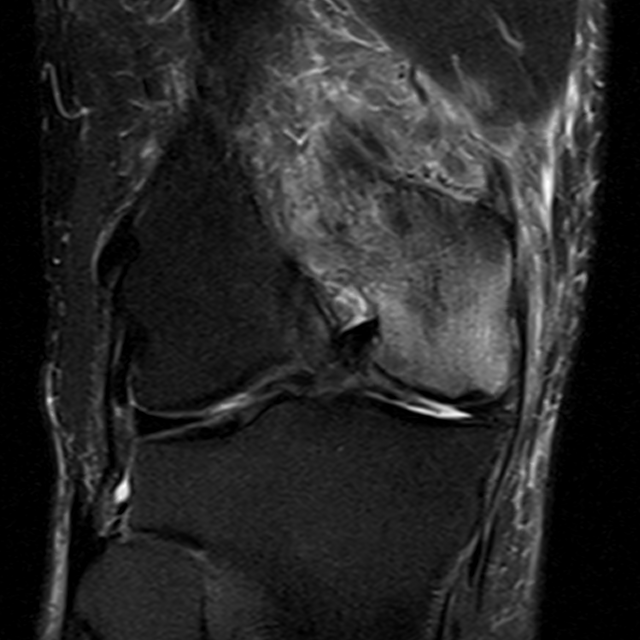

[13 of 40 positions shown; findings below may reference images not displayed]

FINDINGS: MENISCI

Medial meniscus: Complex tear posterior horn medial meniscus with
radial and horizontal intersecting components.

Lateral meniscus:  Unremarkable

LIGAMENTS

Cruciates: Expanded ACL with accentuated T2 signal potentially from
degeneration or sprain.

Collaterals:  Mild mcl thickening proximally with surrounding edema.

CARTILAGE

Patellofemoral: Partial thickness focal chondral defect along the
medial patellar facet, image [DATE], with underlying chondral and
subcortical focal edema. Fissuring and focal chondral delamination
along the lateral patellar facet shown on images 6 through 8 series
3.

Medial: 2.5 by 1.1 cm osteochondral lesion versus focal subcortical
stress fracture of the medial femoral condyle with surrounding
marrow edema. Moderate to prominent degenerative chondral thinning.

Lateral: Mild degenerative chondral thinning along the lateral
femoral condyle.

Joint:  Large knee effusion.

Popliteal Fossa:  Small to moderate size Baker's cyst.

Extensor Mechanism:  Unremarkable

Bones: No significant extra-articular osseous abnormalities
identified.

Other: No supplemental non-categorized findings.
IMPRESSION: 1. Complex tear posterior horn medial meniscus.
2. ACL sprain versus degeneration.
3. Mildly thickened MCL with surrounding edema, suspicious for grade
2 sprain.
4. 2.5 by 1.1 cm subcortical stress fracture or osteochondral lesion
of the medial femoral condyle with considerable surrounding marrow
edema.
5. Chondral fissuring with focal chondral delamination along the
lateral patellar facet. Partial thickness focal chondral defect
along the medial patellar facet.
6. Moderate to prominent degenerative chondral thinning in the
medial compartment. Mild chondral thinning in the lateral
compartment.
7. Large knee joint effusion with small to moderate size Baker's
cyst.

## 2019-07-20 ENCOUNTER — Other Ambulatory Visit: Payer: Self-pay | Admitting: Orthopaedic Surgery

## 2019-07-20 MED ORDER — HYDROCODONE-ACETAMINOPHEN 7.5-325 MG PO TABS: 1.0000 | ORAL_TABLET | Freq: Four times a day (QID) | ORAL | 0 refills | Status: DC | PRN

## 2019-08-17 ENCOUNTER — Other Ambulatory Visit: Payer: Self-pay | Admitting: Orthopaedic Surgery

## 2019-08-17 MED ORDER — HYDROCODONE-ACETAMINOPHEN 7.5-325 MG PO TABS: 1.0000 | ORAL_TABLET | Freq: Four times a day (QID) | ORAL | 0 refills | Status: DC | PRN

## 2019-09-07 ENCOUNTER — Encounter: Payer: Self-pay | Admitting: Orthopaedic Surgery

## 2019-09-07 ENCOUNTER — Other Ambulatory Visit: Payer: Self-pay

## 2019-09-07 ENCOUNTER — Ambulatory Visit: Payer: Medicare HMO | Admitting: Orthopaedic Surgery

## 2019-09-07 DIAGNOSIS — M1711 Unilateral primary osteoarthritis, right knee: Secondary | ICD-10-CM | POA: Diagnosis not present

## 2019-09-07 DIAGNOSIS — Z9889 Other specified postprocedural states: Secondary | ICD-10-CM | POA: Diagnosis not present

## 2019-09-07 NOTE — Progress Notes (Signed)
Patient John Bridges., male DOB:11-Aug-1955, 64 y.o. [id number]  Chief Complaint  Patient presents with  . Hand Pain    Bilat    HPI  [name]. is a 64 y.o. male who has right knee pain and some hand pains.  He is post carpal tunnel release. That went well. His knee has popping and swelling.  His hand have more arthritic pain first thing in the morning. He is taking his medicine and doing his exercises.   There is no height or weight on file to calculate BMI.  ROS  Review of Systems  Constitutional:       Patient does not have Diabetes Mellitus. Patient has hypertension. Patient has COPD or shortness of breath. Patient does not have BMI > 35. Patient has current smoking history.  HENT: Negative for congestion.   Respiratory: Positive for shortness of breath. Negative for cough.   Cardiovascular: Negative for chest pain and leg swelling.  Endocrine: Negative for cold intolerance.  Musculoskeletal: Positive for arthralgias, gait problem, joint swelling and myalgias.  Allergic/Immunologic: Negative for environmental allergies.  All other systems reviewed and are negative.   All other systems reviewed and are negative.  The following is a summary of the past history medically, past history surgically, known current medicines, social history and family history.  This information is gathered electronically by the computer from prior information and documentation.  I review this each visit and have found including this information at this point in the chart is beneficial and informative.    Past Medical History:  Diagnosis Date  . Arthritis   . COPD (chronic obstructive pulmonary disease) ([place])   . Depression   . Essential hypertension   . H/O gastroesophageal reflux (GERD)   . History of bronchitis   . Hyperlipidemia   . Lung nodule    Right upper lobe lung nodule 1.3 cm   . Shortness of breath dyspnea     Past Surgical History:  Procedure Laterality  Date  . CARPAL TUNNEL RELEASE Right 12/17/2018   Procedure: CARPAL TUNNEL RELEASE;  Surgeon: Carole Civil, MD;  Location: [facility];  Service: Orthopedics;  Laterality: Right;  . COLONOSCOPY    . KNEE ARTHROSCOPY WITH MEDIAL MENISECTOMY Right 04/03/2017   Procedure: KNEE ARTHROSCOPY WITH MEDIAL MENISECTOMY;  Surgeon: Carole Civil, MD;  Location: [facility];  Service: Orthopedics;  Laterality: Right;  . LEG SURGERY Left    plate inserted and removed.  . THORACOTOMY Right 09/25/2015   Procedure: MINI/LIMITED THORACOTOMY WITH RIGHT UPPER LOBE WEDGE RESECTION;  Surgeon: Grace Isaac, MD;  Location: [place];  Service: Thoracic;  Laterality: Right;  . TOOTH EXTRACTION    . VIDEO BRONCHOSCOPY N/A 09/25/2015   Procedure: VIDEO BRONCHOSCOPY;  Surgeon: Grace Isaac, MD;  Location: [facility] OR;  Service: Thoracic;  Laterality: N/A;    Family History  Problem Relation Age of Onset  . Hypertension Mother   . Hypertension Father   . Cancer Brother   . CAD Brother   . Stroke Brother     Social History Social History   Tobacco Use  . Smoking status: Former Smoker    Packs/day: 1.00    Years: 40.00    Pack years: 40.00    Types: Cigarettes    Quit date: 08/16/2015    Years since quitting: 4.0  . Smokeless tobacco: Former Network engineer Use Topics  . Alcohol use: Yes    Alcohol/week: 0.0 standard drinks  Comment: 3-4 times a week  . Drug use: No    No Known Allergies  Current Outpatient Medications  Medication Sig Dispense Refill  . albuterol (PROVENTIL HFA;VENTOLIN HFA) 108 (90 Base) MCG/ACT inhaler Inhale 2 puffs every 6 (six) hours as needed into the lungs for wheezing or shortness of breath.    . ALPRAZolam (XANAX) 0.5 MG tablet Take 0.5 mg at bedtime as needed by mouth for anxiety or sleep.     Marland Kitchen amLODipine (NORVASC) 10 MG tablet Take 10 mg by mouth daily.    Marland Kitchen atorvastatin (LIPITOR) 40 MG tablet Take 40 mg by mouth every morning.     . Homeopathic Products (LEG CRAMP  RELIEF PO) Take 2 tablets by mouth 2 (two) times a day.    Marland Kitchen HYDROcodone-acetaminophen (NORCO) 7.5-325 MG tablet Take 1 tablet by mouth every 6 (six) hours as needed for moderate pain (Must last 30 days). 95 tablet 0  . lisinopril-hydrochlorothiazide (PRINZIDE,ZESTORETIC) 20-12.5 MG per tablet Take 1 tablet by mouth daily.    . Omega-3 Fatty Acids (FISH OIL PO) Take 1 capsule daily by mouth.    . ondansetron (ZOFRAN) 4 MG tablet Take 1 tablet (4 mg total) by mouth as needed for nausea or vomiting. 10 tablet 0  . pantoprazole (PROTONIX) 40 MG tablet Take 1 tablet (40 mg total) by mouth daily. 30 tablet 0  . Potassium 99 MG TABS Take 99 mg by mouth every morning.    . triamcinolone cream (KENALOG) 0.1 % Apply 1 application daily as needed topically (for rash on feet).     Marland Kitchen umeclidinium-vilanterol (ANORO ELLIPTA) 62.5-25 MCG/INH AEPB Inhale 1 puff into the lungs daily.     No current facility-administered medications for this visit.     Physical Exam  There were no vitals taken for this visit.  Constitutional: overall normal hygiene, normal nutrition, well developed, normal grooming, normal body habitus. Assistive device:none  Musculoskeletal: gait and station Limp right slightly, muscle tone and strength are normal, no tremors or atrophy is present.  .  Neurological: coordination overall normal.  Deep tendon reflex/nerve stretch intact.  Sensation normal.  Cranial nerves II-XII intact.   Skin:   Normal overall no scars, lesions, ulcers or rashes. No psoriasis.  Psychiatric: Alert and oriented x 3.  Recent memory intact, remote memory unclear.  Normal mood and affect. Well groomed.  Good eye contact.  Cardiovascular: overall no swelling, no varicosities, no edema bilaterally, normal temperatures of the legs and arms, no clubbing, cyanosis and good capillary refill.  Lymphatic: palpation is normal.  Both hands have diffuse tenderness.  Right knee with crepitus, slight effusion, slight  limp right, ROM 0 to 110, stable. NV intact.  All other systems reviewed and are negative   The patient has been educated about the nature of the problem(s) and counseled on treatment options.  The patient appeared to understand what I have discussed and is in agreement with it.  Encounter Diagnoses  Name Primary?  . Primary osteoarthritis of right knee Yes  . S/P carpal tunnel release     PLAN Call if any problems.  Precautions discussed.  Continue current medications.   Return to clinic 3 months   Electronically Signed Sanjuana Kava, MD 4/20/202110:49 AM

## 2019-09-14 ENCOUNTER — Other Ambulatory Visit: Payer: Self-pay | Admitting: Orthopaedic Surgery

## 2019-09-14 MED ORDER — HYDROCODONE-ACETAMINOPHEN 7.5-325 MG PO TABS: 1.0000 | ORAL_TABLET | Freq: Four times a day (QID) | ORAL | 0 refills | Status: DC | PRN

## 2019-10-15 ENCOUNTER — Other Ambulatory Visit: Payer: Self-pay | Admitting: Orthopaedic Surgery

## 2019-10-18 MED ORDER — HYDROCODONE-ACETAMINOPHEN 7.5-325 MG PO TABS: 1.0000 | ORAL_TABLET | Freq: Four times a day (QID) | ORAL | 0 refills | Status: DC | PRN

## 2019-11-15 ENCOUNTER — Other Ambulatory Visit: Payer: Self-pay | Admitting: Orthopaedic Surgery

## 2019-11-16 MED ORDER — HYDROCODONE-ACETAMINOPHEN 7.5-325 MG PO TABS: 1.0000 | ORAL_TABLET | Freq: Four times a day (QID) | ORAL | 0 refills | Status: DC | PRN

## 2019-12-07 ENCOUNTER — Ambulatory Visit: Payer: Medicare HMO | Admitting: Orthopaedic Surgery

## 2019-12-07 ENCOUNTER — Other Ambulatory Visit: Payer: Self-pay

## 2019-12-07 ENCOUNTER — Encounter: Payer: Self-pay | Admitting: Orthopaedic Surgery

## 2019-12-07 VITALS — BP 159/86 | HR 87 | Ht 69.0 in | Wt 214.0 lb

## 2019-12-07 DIAGNOSIS — Z9889 Other specified postprocedural states: Secondary | ICD-10-CM

## 2019-12-07 DIAGNOSIS — M1711 Unilateral primary osteoarthritis, right knee: Secondary | ICD-10-CM

## 2019-12-07 DIAGNOSIS — M79642 Pain in left hand: Secondary | ICD-10-CM | POA: Diagnosis not present

## 2019-12-07 DIAGNOSIS — M79641 Pain in right hand: Secondary | ICD-10-CM | POA: Diagnosis not present

## 2019-12-07 NOTE — Progress Notes (Signed)
Patient John L John Livings., male DOB:February 20, 1956, 64 y.o. FTD:[id number]  Chief Complaint  Patient presents with  . Hand Pain    right     HPI  John SOMERS Sr. is a 64 y.o. male who has bilateral hand pains, post carpal tunnel release and knee pains.  He has good and bad days.  He has no new trauma.  He is active.   Body mass index is 31.6 kg/m.  ROS  Review of Systems  Constitutional:       Patient does not have Diabetes Mellitus. Patient has hypertension. Patient has COPD or shortness of breath. Patient does not have BMI > 35. Patient has current smoking history.  HENT: Negative for congestion.   Respiratory: Positive for shortness of breath. Negative for cough.   Cardiovascular: Negative for chest pain and leg swelling.  Endocrine: Negative for cold intolerance.  Musculoskeletal: Positive for arthralgias, gait problem, joint swelling and myalgias.  Allergic/Immunologic: Negative for environmental allergies.  All other systems reviewed and are negative.   All other systems reviewed and are negative.  The following is a summary of the past history medically, past history surgically, known current medicines, social history and family history.  This information is gathered electronically by the computer from prior information and documentation.  I review this each visit and have found including this information at this point in the chart is beneficial and informative.    Past Medical History:  Diagnosis Date  . Arthritis   . COPD (chronic obstructive pulmonary disease) ([place])   . Depression   . Essential hypertension   . H/O gastroesophageal reflux (GERD)   . History of bronchitis   . Hyperlipidemia   . Lung nodule    Right upper lobe lung nodule 1.3 cm   . Shortness of breath dyspnea     Past Surgical History:  Procedure Laterality Date  . CARPAL TUNNEL RELEASE Right 12/17/2018   Procedure: CARPAL TUNNEL RELEASE;  Surgeon: Carole Civil, MD;  Location: [facility];  Service: Orthopedics;  Laterality: Right;  . COLONOSCOPY    . KNEE ARTHROSCOPY WITH MEDIAL MENISECTOMY Right 04/03/2017   Procedure: KNEE ARTHROSCOPY WITH MEDIAL MENISECTOMY;  Surgeon: Carole Civil, MD;  Location: [facility];  Service: Orthopedics;  Laterality: Right;  . LEG SURGERY Left    plate inserted and removed.  . THORACOTOMY Right 09/25/2015   Procedure: MINI/LIMITED THORACOTOMY WITH RIGHT UPPER LOBE WEDGE RESECTION;  Surgeon: Grace Isaac, MD;  Location: [place];  Service: Thoracic;  Laterality: Right;  . TOOTH EXTRACTION    . VIDEO BRONCHOSCOPY N/A 09/25/2015   Procedure: VIDEO BRONCHOSCOPY;  Surgeon: Grace Isaac, MD;  Location: [facility] OR;  Service: Thoracic;  Laterality: N/A;    Family History  Problem Relation Age of Onset  . Hypertension Mother   . Hypertension Father   . Cancer Brother   . CAD Brother   . Stroke Brother     Social History Social History   Tobacco Use  . Smoking status: Former Smoker    Packs/day: 1.00    Years: 40.00    Pack years: 40.00    Types: Cigarettes    Quit date: 08/16/2015    Years since quitting: 4.3  . Smokeless tobacco: Former Network engineer  . Vaping Use: Never used  Substance Use Topics  . Alcohol use: Yes    Alcohol/week: 0.0 standard drinks    Comment: 3-4 times a week  . Drug use: No  No Known Allergies  Current Outpatient Medications  Medication Sig Dispense Refill  . albuterol (PROVENTIL HFA;VENTOLIN HFA) 108 (90 Base) MCG/ACT inhaler Inhale 2 puffs every 6 (six) hours as needed into the lungs for wheezing or shortness of breath.    . ALPRAZolam (XANAX) 0.5 MG tablet Take 0.5 mg at bedtime as needed by mouth for anxiety or sleep.     Marland Kitchen amLODipine (NORVASC) 10 MG tablet Take 10 mg by mouth daily.    Marland Kitchen atorvastatin (LIPITOR) 40 MG tablet Take 40 mg by mouth every morning.     . Homeopathic Products (LEG CRAMP RELIEF PO) Take 2 tablets by mouth 2 (two) times a day.    Marland Kitchen HYDROcodone-acetaminophen (NORCO)  7.5-325 MG tablet Take 1 tablet by mouth every 6 (six) hours as needed for moderate pain (Must last 30 days). 95 tablet 0  . lisinopril-hydrochlorothiazide (PRINZIDE,ZESTORETIC) 20-12.5 MG per tablet Take 1 tablet by mouth daily.    . Omega-3 Fatty Acids (FISH OIL PO) Take 1 capsule daily by mouth.    . ondansetron (ZOFRAN) 4 MG tablet Take 1 tablet (4 mg total) by mouth as needed for nausea or vomiting. 10 tablet 0  . pantoprazole (PROTONIX) 40 MG tablet Take 1 tablet (40 mg total) by mouth daily. 30 tablet 0  . Potassium 99 MG TABS Take 99 mg by mouth every morning.    . triamcinolone cream (KENALOG) 0.1 % Apply 1 application daily as needed topically (for rash on feet).     Marland Kitchen umeclidinium-vilanterol (ANORO ELLIPTA) 62.5-25 MCG/INH AEPB Inhale 1 puff into the lungs daily.     No current facility-administered medications for this visit.     Physical Exam  Blood pressure (!) 159/86, pulse 87, height 5\' 9"  (1.753 m), weight 214 lb (97.1 kg).  Constitutional: overall normal hygiene, normal nutrition, well developed, normal grooming, normal body habitus. Assistive device:none  Musculoskeletal: gait and station Limp none, muscle tone and strength are normal, no tremors or atrophy is present.  .  Neurological: coordination overall normal.  Deep tendon reflex/nerve stretch intact.  Sensation normal.  Cranial nerves II-XII intact.   Skin:   Normal overall no scars, lesions, ulcers or rashes. No psoriasis.  Psychiatric: Alert and oriented x 3.  Recent memory intact, remote memory unclear.  Normal mood and affect. Well groomed.  Good eye contact.  Cardiovascular: overall no swelling, no varicosities, no edema bilaterally, normal temperatures of the legs and arms, no clubbing, cyanosis and good capillary refill.  Lymphatic: palpation is normal.  All other systems reviewed and are negative   The patient has been educated about the nature of the problem(s) and counseled on treatment options.   The patient appeared to understand what I have discussed and is in agreement with it.  Encounter Diagnoses  Name Primary?  . Bilateral hand pain Yes  . S/P carpal tunnel release   . Primary osteoarthritis of right knee     PLAN Call if any problems.  Precautions discussed.  Continue current medications.   Return to clinic 3 months   Electronically Signed Sanjuana Kava, MD 7/20/202110:34 AM

## 2019-12-14 ENCOUNTER — Other Ambulatory Visit: Payer: Self-pay | Admitting: Orthopaedic Surgery

## 2019-12-16 MED ORDER — HYDROCODONE-ACETAMINOPHEN 7.5-325 MG PO TABS: 1.0000 | ORAL_TABLET | Freq: Four times a day (QID) | ORAL | 0 refills | Status: DC | PRN

## 2020-01-12 ENCOUNTER — Other Ambulatory Visit: Payer: Self-pay | Admitting: Orthopaedic Surgery

## 2020-01-12 MED ORDER — HYDROCODONE-ACETAMINOPHEN 7.5-325 MG PO TABS: 1.0000 | ORAL_TABLET | Freq: Four times a day (QID) | ORAL | 0 refills | Status: DC | PRN

## 2020-01-12 NOTE — Telephone Encounter (Signed)
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  Last refill of Hydrocodone was 12/16/2019. He takes 1 tablet by mouth every 6 hours for pain.   The pharmacy is [facility] in [place]. Please advise.

## 2020-02-11 ENCOUNTER — Other Ambulatory Visit: Payer: Self-pay

## 2020-02-14 MED ORDER — HYDROCODONE-ACETAMINOPHEN 7.5-325 MG PO TABS: 1.0000 | ORAL_TABLET | Freq: Four times a day (QID) | ORAL | 0 refills | Status: DC | PRN

## 2020-03-07 ENCOUNTER — Ambulatory Visit (INDEPENDENT_AMBULATORY_CARE_PROVIDER_SITE_OTHER): Payer: Medicare HMO | Admitting: Orthopaedic Surgery

## 2020-03-07 ENCOUNTER — Encounter: Payer: Self-pay | Admitting: Orthopaedic Surgery

## 2020-03-07 ENCOUNTER — Other Ambulatory Visit: Payer: Self-pay

## 2020-03-07 VITALS — BP 136/82 | HR 64 | Ht 69.0 in | Wt 214.0 lb

## 2020-03-07 DIAGNOSIS — M1711 Unilateral primary osteoarthritis, right knee: Secondary | ICD-10-CM | POA: Diagnosis not present

## 2020-03-07 DIAGNOSIS — M79641 Pain in right hand: Secondary | ICD-10-CM | POA: Diagnosis not present

## 2020-03-07 DIAGNOSIS — M79642 Pain in left hand: Secondary | ICD-10-CM

## 2020-03-07 NOTE — Progress Notes (Signed)
Patient John L [name], male DOB:10/06/1955, 64 y.o. VQQ:[id number]  Chief Complaint  Patient presents with  . Hand Pain    bilateral     HPI  John HUSKINS Sr. is a 64 y.o. male who has bilateral hand pains and right knee pain.  The cooler weather has made him worse.  He has no new trauma.  He has no redness.  He is taking his medicine.   Body mass index is 31.6 kg/m.  ROS  Review of Systems  Constitutional:       Patient does not have Diabetes Mellitus. Patient has hypertension. Patient has COPD or shortness of breath. Patient does not have BMI > 35. Patient has current smoking history.  HENT: Negative for congestion.   Respiratory: Positive for shortness of breath. Negative for cough.   Cardiovascular: Negative for chest pain and leg swelling.  Endocrine: Negative for cold intolerance.  Musculoskeletal: Positive for arthralgias, gait problem, joint swelling and myalgias.  Allergic/Immunologic: Negative for environmental allergies.  All other systems reviewed and are negative.   All other systems reviewed and are negative.  The following is a summary of the past history medically, past history surgically, known current medicines, social history and family history.  This information is gathered electronically by the computer from prior information and documentation.  I review this each visit and have found including this information at this point in the chart is beneficial and informative.    Past Medical History:  Diagnosis Date  . Arthritis   . COPD (chronic obstructive pulmonary disease) ([place])   . Depression   . Essential hypertension   . H/O gastroesophageal reflux (GERD)   . History of bronchitis   . Hyperlipidemia   . Lung nodule    Right upper lobe lung nodule 1.3 cm   . Shortness of breath dyspnea     Past Surgical History:  Procedure Laterality Date  . CARPAL TUNNEL RELEASE Right 12/17/2018   Procedure: CARPAL TUNNEL RELEASE;  Surgeon: Carole Civil, MD;  Location: [facility];  Service: Orthopedics;  Laterality: Right;  . COLONOSCOPY    . KNEE ARTHROSCOPY WITH MEDIAL MENISECTOMY Right 04/03/2017   Procedure: KNEE ARTHROSCOPY WITH MEDIAL MENISECTOMY;  Surgeon: Carole Civil, MD;  Location: [facility];  Service: Orthopedics;  Laterality: Right;  . LEG SURGERY Left    plate inserted and removed.  . THORACOTOMY Right 09/25/2015   Procedure: MINI/LIMITED THORACOTOMY WITH RIGHT UPPER LOBE WEDGE RESECTION;  Surgeon: Grace Isaac, MD;  Location: [facility];  Service: Thoracic;  Laterality: Right;  . TOOTH EXTRACTION    . VIDEO BRONCHOSCOPY N/A 09/25/2015   Procedure: VIDEO BRONCHOSCOPY;  Surgeon: Grace Isaac, MD;  Location: [facility] OR;  Service: Thoracic;  Laterality: N/A;    Family History  Problem Relation Age of Onset  . Hypertension Mother   . Hypertension Father   . Cancer Brother   . CAD Brother   . Stroke Brother     Social History Social History   Tobacco Use  . Smoking status: Former Smoker    Packs/day: 1.00    Years: 40.00    Pack years: 40.00    Types: Cigarettes    Quit date: 08/16/2015    Years since quitting: 4.5  . Smokeless tobacco: Former Network engineer  . Vaping Use: Never used  Substance Use Topics  . Alcohol use: Yes    Alcohol/week: 0.0 standard drinks    Comment: 3-4 times a week  .  Drug use: No    No Known Allergies  Current Outpatient Medications  Medication Sig Dispense Refill  . albuterol (PROVENTIL HFA;VENTOLIN HFA) 108 (90 Base) MCG/ACT inhaler Inhale 2 puffs every 6 (six) hours as needed into the lungs for wheezing or shortness of breath.    . ALPRAZolam (XANAX) 0.5 MG tablet Take 0.5 mg at bedtime as needed by mouth for anxiety or sleep.     Marland Kitchen amLODipine (NORVASC) 10 MG tablet Take 10 mg by mouth daily.    Marland Kitchen atorvastatin (LIPITOR) 40 MG tablet Take 40 mg by mouth every morning.     Marland Kitchen HYDROcodone-acetaminophen (NORCO) 7.5-325 MG tablet Take 1 tablet by mouth every 6 (six) hours as  needed for moderate pain (Must last 30 days). 95 tablet 0  . lisinopril-hydrochlorothiazide (PRINZIDE,ZESTORETIC) 20-12.5 MG per tablet Take 1 tablet by mouth daily.    . Omega-3 Fatty Acids (FISH OIL PO) Take 1 capsule daily by mouth.    . ondansetron (ZOFRAN) 4 MG tablet Take 1 tablet (4 mg total) by mouth as needed for nausea or vomiting. 10 tablet 0  . pantoprazole (PROTONIX) 40 MG tablet Take 1 tablet (40 mg total) by mouth daily. 30 tablet 0  . Potassium 99 MG TABS Take 99 mg by mouth every morning.    . triamcinolone cream (KENALOG) 0.1 % Apply 1 application daily as needed topically (for rash on feet).     Marland Kitchen umeclidinium-vilanterol (ANORO ELLIPTA) 62.5-25 MCG/INH AEPB Inhale 1 puff into the lungs daily.     No current facility-administered medications for this visit.     Physical Exam  Blood pressure 136/82, pulse 64, height 5\' 9"  (1.753 m), weight 214 lb (97.1 kg).  Constitutional: overall normal hygiene, normal nutrition, well developed, normal grooming, normal body habitus. Assistive device:none  Musculoskeletal: gait and station Limp right, muscle tone and strength are normal, no tremors or atrophy is present.  .  Neurological: coordination overall normal.  Deep tendon reflex/nerve stretch intact.  Sensation normal.  Cranial nerves II-XII intact.   Skin:   Normal overall no scars, lesions, ulcers or rashes. No psoriasis.  Psychiatric: Alert and oriented x 3.  Recent memory intact, remote memory unclear.  Normal mood and affect. Well groomed.  Good eye contact.  Cardiovascular: overall no swelling, no varicosities, no edema bilaterally, normal temperatures of the legs and arms, no clubbing, cyanosis and good capillary refill.  Lymphatic: palpation is normal.  Right knee tender, limp right, some effusion, crepitus, ROM 0 to 110, stable.  Both hands are tender, no swelling.  NV intact.  All other systems reviewed and are negative   The patient has been educated about  the nature of the problem(s) and counseled on treatment options.  The patient appeared to understand what I have discussed and is in agreement with it.  Encounter Diagnoses  Name Primary?  . Bilateral hand pain Yes  . Primary osteoarthritis of right knee     PLAN Call if any problems.  Precautions discussed.  Continue current medications.   Return to clinic 3 months   Electronically Signed Sanjuana Kava, MD 10/19/202110:01 AM

## 2020-03-14 ENCOUNTER — Other Ambulatory Visit: Payer: Self-pay | Admitting: Orthopaedic Surgery

## 2020-03-14 MED ORDER — HYDROCODONE-ACETAMINOPHEN 7.5-325 MG PO TABS: 1.0000 | ORAL_TABLET | Freq: Four times a day (QID) | ORAL | 0 refills | Status: DC | PRN

## 2020-04-11 ENCOUNTER — Other Ambulatory Visit: Payer: Self-pay | Admitting: Orthopaedic Surgery

## 2020-04-11 MED ORDER — HYDROCODONE-ACETAMINOPHEN 7.5-325 MG PO TABS: 1.0000 | ORAL_TABLET | Freq: Four times a day (QID) | ORAL | 0 refills | Status: AC | PRN

## 2020-04-11 NOTE — Telephone Encounter (Signed)
Will you refill while Dr Luna Glasgow is out of the office?

## 2020-04-20 ENCOUNTER — Telehealth: Payer: Self-pay | Admitting: Orthopedic Surgery

## 2020-04-20 MED ORDER — HYDROCODONE-ACETAMINOPHEN 7.5-325 MG PO TABS: 1.0000 | ORAL_TABLET | Freq: Four times a day (QID) | ORAL | 0 refills | Status: DC | PRN

## 2020-04-20 NOTE — Telephone Encounter (Signed)
Patient requests refill on Hydrocodone/Acetaminophen 7.5-325 mgs.  Qty  95      Sig: Take 1 tablet by mouth every 6 (six) hours as needed for moderate pain (Must last 30 days).   Patient states he uses [facility] on [address] in [place], [place]

## 2020-05-09 ENCOUNTER — Other Ambulatory Visit: Payer: Self-pay | Admitting: Orthopaedic Surgery

## 2020-05-10 MED ORDER — HYDROCODONE-ACETAMINOPHEN 7.5-325 MG PO TABS: 1.0000 | ORAL_TABLET | Freq: Four times a day (QID) | ORAL | 0 refills | Status: AC | PRN

## 2020-05-23 ENCOUNTER — Telehealth: Payer: Self-pay | Admitting: Orthopaedic Surgery

## 2020-05-23 MED ORDER — HYDROCODONE-ACETAMINOPHEN 7.5-325 MG PO TABS: 1.0000 | ORAL_TABLET | Freq: Four times a day (QID) | ORAL | 0 refills | Status: DC | PRN

## 2020-05-23 NOTE — Telephone Encounter (Signed)
Call received from patient for refill: - patient aware of appointment scheduled 06/06/20 HYDROcodone-acetaminophen (NORCO) 7.5-325 MG tablet / 30-day supply  [facility], [address], [place], [place]

## 2020-06-06 ENCOUNTER — Ambulatory Visit (INDEPENDENT_AMBULATORY_CARE_PROVIDER_SITE_OTHER): Payer: Medicare HMO | Admitting: Orthopaedic Surgery

## 2020-06-06 ENCOUNTER — Encounter: Payer: Self-pay | Admitting: Orthopaedic Surgery

## 2020-06-06 ENCOUNTER — Other Ambulatory Visit: Payer: Self-pay

## 2020-06-06 VITALS — BP 174/87 | HR 98 | Ht 69.0 in | Wt 210.4 lb

## 2020-06-06 DIAGNOSIS — M79642 Pain in left hand: Secondary | ICD-10-CM | POA: Diagnosis not present

## 2020-06-06 DIAGNOSIS — M79641 Pain in right hand: Secondary | ICD-10-CM

## 2020-06-06 DIAGNOSIS — M1711 Unilateral primary osteoarthritis, right knee: Secondary | ICD-10-CM | POA: Diagnosis not present

## 2020-06-06 NOTE — Progress Notes (Signed)
Patient JW:JXBJY L John Bridges., male DOB:1955-10-15, 65 y.o. [id number]  Chief Complaint  Patient presents with  . Arm Pain    R/ arm and hand both are throbbing today    HPI  John SCHERTZER Sr. is a 65 y.o. male who has pain in both hands secondary to arthritis.  The cold weather and snow we just had make him worse.  He has used a cream.  He uses gloves outdoors.  He has no new trauma.   Body mass index is 31.07 kg/m.  ROS  Review of Systems  Constitutional:       Patient does not have Diabetes Mellitus. Patient has hypertension. Patient has COPD or shortness of breath. Patient does not have BMI > 35. Patient has current smoking history.  HENT: Negative for congestion.   Respiratory: Positive for shortness of breath. Negative for cough.   Cardiovascular: Negative for chest pain and leg swelling.  Endocrine: Negative for cold intolerance.  Musculoskeletal: Positive for arthralgias, gait problem, joint swelling and myalgias.  Allergic/Immunologic: Negative for environmental allergies.  All other systems reviewed and are negative.   All other systems reviewed and are negative.  The following is a summary of the past history medically, past history surgically, known current medicines, social history and family history.  This information is gathered electronically by the computer from prior information and documentation.  I review this each visit and have found including this information at this point in the chart is beneficial and informative.    Past Medical History:  Diagnosis Date  . Arthritis   . COPD (chronic obstructive pulmonary disease) ([place])   . Depression   . Essential hypertension   . H/O gastroesophageal reflux (GERD)   . History of bronchitis   . Hyperlipidemia   . Lung nodule    Right upper lobe lung nodule 1.3 cm   . Shortness of breath dyspnea     Past Surgical History:  Procedure Laterality Date  . CARPAL TUNNEL RELEASE Right 12/17/2018   Procedure:  CARPAL TUNNEL RELEASE;  Surgeon: Carole Civil, MD;  Location: [facility];  Service: Orthopedics;  Laterality: Right;  . COLONOSCOPY    . KNEE ARTHROSCOPY WITH MEDIAL MENISECTOMY Right 04/03/2017   Procedure: KNEE ARTHROSCOPY WITH MEDIAL MENISECTOMY;  Surgeon: Carole Civil, MD;  Location: [facility];  Service: Orthopedics;  Laterality: Right;  . LEG SURGERY Left    plate inserted and removed.  . THORACOTOMY Right 09/25/2015   Procedure: MINI/LIMITED THORACOTOMY WITH RIGHT UPPER LOBE WEDGE RESECTION;  Surgeon: Grace Isaac, MD;  Location: [facility];  Service: Thoracic;  Laterality: Right;  . TOOTH EXTRACTION    . VIDEO BRONCHOSCOPY N/A 09/25/2015   Procedure: VIDEO BRONCHOSCOPY;  Surgeon: Grace Isaac, MD;  [facility];  Service: Thoracic;  Laterality: N/A;    Family History  Problem Relation Age of Onset  . Hypertension Mother   . Hypertension Father   . Cancer Brother   . CAD Brother   . Stroke Brother     Social History Social History   Tobacco Use  . Smoking status: Former Smoker    Packs/day: 1.00    Years: 40.00    Pack years: 40.00    Types: Cigarettes    Quit date: 08/16/2015    Years since quitting: 4.8  . Smokeless tobacco: Former Network engineer  . Vaping Use: Never used  Substance Use Topics  . Alcohol use: Yes    Alcohol/week: 0.0 standard  drinks    Comment: 3-4 times a week  . Drug use: No    No Known Allergies  Current Outpatient Medications  Medication Sig Dispense Refill  . albuterol (PROVENTIL HFA;VENTOLIN HFA) 108 (90 Base) MCG/ACT inhaler Inhale 2 puffs every 6 (six) hours as needed into the lungs for wheezing or shortness of breath.    . ALPRAZolam (XANAX) 0.5 MG tablet Take 0.5 mg at bedtime as needed by mouth for anxiety or sleep.     Marland Kitchen amLODipine (NORVASC) 10 MG tablet Take 10 mg by mouth daily.    Marland Kitchen atorvastatin (LIPITOR) 40 MG tablet Take 40 mg by mouth every morning.     Marland Kitchen HYDROcodone-acetaminophen (NORCO) 7.5-325 MG tablet Take  1 tablet by mouth every 6 (six) hours as needed for moderate pain (Must last 30 days.). 95 tablet 0  . lisinopril-hydrochlorothiazide (PRINZIDE,ZESTORETIC) 20-12.5 MG per tablet Take 1 tablet by mouth daily.    . Omega-3 Fatty Acids (FISH OIL PO) Take 1 capsule daily by mouth.    . ondansetron (ZOFRAN) 4 MG tablet Take 1 tablet (4 mg total) by mouth as needed for nausea or vomiting. 10 tablet 0  . pantoprazole (PROTONIX) 40 MG tablet Take 1 tablet (40 mg total) by mouth daily. 30 tablet 0  . Potassium 99 MG TABS Take 99 mg by mouth every morning.    . triamcinolone cream (KENALOG) 0.1 % Apply 1 application daily as needed topically (for rash on feet).     Marland Kitchen umeclidinium-vilanterol (ANORO ELLIPTA) 62.5-25 MCG/INH AEPB Inhale 1 puff into the lungs daily.     No current facility-administered medications for this visit.     Physical Exam  Blood pressure (!) 174/87, pulse 98, height 5\' 9"  (1.753 m), weight 210 lb 6 oz (95.4 kg).  Constitutional: overall normal hygiene, normal nutrition, well developed, normal grooming, normal body habitus. Assistive device:none  Musculoskeletal: gait and station Limp none, muscle tone and strength are normal, no tremors or atrophy is present.  .  Neurological: coordination overall normal.  Deep tendon reflex/nerve stretch intact.  Sensation normal.  Cranial nerves II-XII intact.   Skin:   Normal overall no scars, lesions, ulcers or rashes. No psoriasis.  Psychiatric: Alert and oriented x 3.  Recent memory intact, remote memory unclear.  Normal mood and affect. Well groomed.  Good eye contact.  Cardiovascular: overall no swelling, no varicosities, no edema bilaterally, normal temperatures of the legs and arms, no clubbing, cyanosis and good capillary refill.  Lymphatic: palpation is normal.  Both hands are tender and have some swelling and fusiform changes.  NV intact.  All other systems reviewed and are negative   The patient has been educated about  the nature of the problem(s) and counseled on treatment options.  The patient appeared to understand what I have discussed and is in agreement with it.  Encounter Diagnoses  Name Primary?  . Bilateral hand pain Yes  . Primary osteoarthritis of right knee     PLAN Call if any problems.  Precautions discussed.  Continue current medications.   Return to clinic 3 months   Electronically Signed Sanjuana Kava, MD 1/18/20222:08 PM

## 2020-06-12 ENCOUNTER — Telehealth: Payer: Self-pay | Admitting: Orthopaedic Surgery

## 2020-06-13 MED ORDER — HYDROCODONE-ACETAMINOPHEN 7.5-325 MG PO TABS: 1.0000 | ORAL_TABLET | Freq: Four times a day (QID) | ORAL | 0 refills | Status: DC | PRN

## 2020-07-11 ENCOUNTER — Telehealth: Payer: Self-pay | Admitting: Orthopaedic Surgery

## 2020-07-11 MED ORDER — HYDROCODONE-ACETAMINOPHEN 7.5-325 MG PO TABS: 1.0000 | ORAL_TABLET | Freq: Four times a day (QID) | ORAL | 0 refills | Status: DC | PRN

## 2020-07-11 NOTE — Telephone Encounter (Signed)
Hydrocodone-Acetaminophen 7.5/325 mg  Qty 95 Tablets  PATIENT USES [facility]

## 2020-08-08 ENCOUNTER — Telehealth: Payer: Self-pay | Admitting: Orthopaedic Surgery

## 2020-08-08 MED ORDER — HYDROCODONE-ACETAMINOPHEN 7.5-325 MG PO TABS: 1.0000 | ORAL_TABLET | Freq: Four times a day (QID) | ORAL | 0 refills | Status: DC | PRN

## 2020-09-05 ENCOUNTER — Ambulatory Visit (INDEPENDENT_AMBULATORY_CARE_PROVIDER_SITE_OTHER): Payer: Medicare HMO | Admitting: Orthopaedic Surgery

## 2020-09-05 ENCOUNTER — Other Ambulatory Visit: Payer: Self-pay

## 2020-09-05 ENCOUNTER — Encounter: Payer: Self-pay | Admitting: Orthopaedic Surgery

## 2020-09-05 VITALS — BP 174/78 | HR 92 | Ht 69.0 in | Wt 210.0 lb

## 2020-09-05 DIAGNOSIS — M79642 Pain in left hand: Secondary | ICD-10-CM

## 2020-09-05 DIAGNOSIS — M79641 Pain in right hand: Secondary | ICD-10-CM

## 2020-09-05 DIAGNOSIS — M1711 Unilateral primary osteoarthritis, right knee: Secondary | ICD-10-CM

## 2020-09-05 MED ORDER — HYDROCODONE-ACETAMINOPHEN 7.5-325 MG PO TABS: 1.0000 | ORAL_TABLET | Freq: Four times a day (QID) | ORAL | 0 refills | Status: DC | PRN

## 2020-09-05 NOTE — Progress Notes (Signed)
Patient John L [name], male DOB:[birth year], 65 y.o. WFU:[id number]  Chief Complaint  Patient presents with  . Hand Pain    Bilateral     HPI  John RISON Sr. is a 65 y.o. male who has bilateral hand pain, more in the distal metatarsals, more in the morning.  He uses Voltaren Gel.  He has no numbness.  He has no trauma.     Body mass index is 31.01 kg/m.  ROS  Review of Systems  Constitutional:       Patient does not have Diabetes Mellitus. Patient has hypertension. Patient has COPD or shortness of breath. Patient does not have BMI > 35. Patient has current smoking history.  HENT: Negative for congestion.   Respiratory: Positive for shortness of breath. Negative for cough.   Cardiovascular: Negative for chest pain and leg swelling.  Endocrine: Negative for cold intolerance.  Musculoskeletal: Positive for arthralgias, gait problem, joint swelling and myalgias.  Allergic/Immunologic: Negative for environmental allergies.  All other systems reviewed and are negative.   All other systems reviewed and are negative.  The following is a summary of the past history medically, past history surgically, known current medicines, social history and family history.  This information is gathered electronically by the computer from prior information and documentation.  I review this each visit and have found including this information at this point in the chart is beneficial and informative.    Past Medical History:  Diagnosis Date  . Arthritis   . COPD (chronic obstructive pulmonary disease) ([place])   . Depression   . Essential hypertension   . H/O gastroesophageal reflux (GERD)   . History of bronchitis   . Hyperlipidemia   . Lung nodule    Right upper lobe lung nodule 1.3 cm   . Shortness of breath dyspnea     Past Surgical History:  Procedure Laterality Date  . CARPAL TUNNEL RELEASE Right 12/17/2018   Procedure: CARPAL TUNNEL RELEASE;  Surgeon: Carole Civil, MD;   Location: [facility];  Service: Orthopedics;  Laterality: Right;  . COLONOSCOPY    . KNEE ARTHROSCOPY WITH MEDIAL MENISECTOMY Right 04/03/2017   Procedure: KNEE ARTHROSCOPY WITH MEDIAL MENISECTOMY;  Surgeon: Carole Civil, MD;  Location: [facility];  Service: Orthopedics;  Laterality: Right;  . LEG SURGERY Left    plate inserted and removed.  . THORACOTOMY Right 09/25/2015   Procedure: MINI/LIMITED THORACOTOMY WITH RIGHT UPPER LOBE WEDGE RESECTION;  Surgeon: Grace Isaac, MD;  Location: [facility];  Service: Thoracic;  Laterality: Right;  . TOOTH EXTRACTION    . VIDEO BRONCHOSCOPY N/A 09/25/2015   Procedure: VIDEO BRONCHOSCOPY;  Surgeon: Grace Isaac, MD;  Location: [facility] OR;  Service: Thoracic;  Laterality: N/A;    Family History  Problem Relation Age of Onset  . Hypertension Mother   . Hypertension Father   . Cancer Brother   . CAD Brother   . Stroke Brother     Social History Social History   Tobacco Use  . Smoking status: Former Smoker    Packs/day: 1.00    Years: 40.00    Pack years: 40.00    Types: Cigarettes    Quit date: 08/16/2015    Years since quitting: 5.0  . Smokeless tobacco: Former Network engineer  . Vaping Use: Never used  Substance Use Topics  . Alcohol use: Yes    Alcohol/week: 0.0 standard drinks    Comment: 3-4 times a week  . Drug use:  No    No Known Allergies  Current Outpatient Medications  Medication Sig Dispense Refill  . albuterol (PROVENTIL HFA;VENTOLIN HFA) 108 (90 Base) MCG/ACT inhaler Inhale 2 puffs every 6 (six) hours as needed into the lungs for wheezing or shortness of breath.    . ALPRAZolam (XANAX) 0.5 MG tablet Take 0.5 mg at bedtime as needed by mouth for anxiety or sleep.     Marland Kitchen amLODipine (NORVASC) 10 MG tablet Take 10 mg by mouth daily.    Marland Kitchen atorvastatin (LIPITOR) 40 MG tablet Take 40 mg by mouth every morning.     . hydrochlorothiazide (HYDRODIURIL) 25 MG tablet Take 25 mg by mouth daily.    . Omega-3 Fatty Acids (FISH OIL PO)  Take 1 capsule daily by mouth.    . ondansetron (ZOFRAN) 4 MG tablet Take 1 tablet (4 mg total) by mouth as needed for nausea or vomiting. 10 tablet 0  . pantoprazole (PROTONIX) 40 MG tablet Take 1 tablet (40 mg total) by mouth daily. 30 tablet 0  . Potassium 99 MG TABS Take 99 mg by mouth every morning.    . triamcinolone cream (KENALOG) 0.1 % Apply 1 application daily as needed topically (for rash on feet).     Marland Kitchen umeclidinium-vilanterol (ANORO ELLIPTA) 62.5-25 MCG/INH AEPB Inhale 1 puff into the lungs daily.    Marland Kitchen HYDROcodone-acetaminophen (NORCO) 7.5-325 MG tablet Take 1 tablet by mouth every 6 (six) hours as needed for moderate pain (Must last 30 days.). 95 tablet 0   No current facility-administered medications for this visit.     Physical Exam  Blood pressure (!) 174/78, pulse 92, height 5\' 9"  (1.753 m), weight 210 lb (95.3 kg).  Constitutional: overall normal hygiene, normal nutrition, well developed, normal grooming, normal body habitus. Assistive device:none  Musculoskeletal: gait and station Limp none, muscle tone and strength are normal, no tremors or atrophy is present.  .  Neurological: coordination overall normal.  Deep tendon reflex/nerve stretch intact.  Sensation normal.  Cranial nerves II-XII intact.   Skin:   Normal overall no scars, lesions, ulcers or rashes. No psoriasis.  Psychiatric: Alert and oriented x 3.  Recent memory intact, remote memory unclear.  Normal mood and affect. Well groomed.  Good eye contact.  Cardiovascular: overall no swelling, no varicosities, no edema bilaterally, normal temperatures of the legs and arms, no clubbing, cyanosis and good capillary refill.  Lymphatic: palpation is normal.  Hands are tender at the metatarsal phalangeal joints, slight swelling, NV intact.  All other systems reviewed and are negative   The patient has been educated about the nature of the problem(s) and counseled on treatment options.  The patient appeared to  understand what I have discussed and is in agreement with it.  Encounter Diagnoses  Name Primary?  . Bilateral hand pain Yes  . Primary osteoarthritis of right knee     PLAN Call if any problems.  Precautions discussed.  Continue current medications.   Return to clinic 3 months   I have reviewed the [place] web site prior to prescribing narcotic medicine for this patient.   Electronically Signed Sanjuana Kava, MD 4/19/202210:23 AM

## 2020-10-03 ENCOUNTER — Telehealth: Payer: Self-pay | Admitting: Orthopaedic Surgery

## 2020-10-03 MED ORDER — HYDROCODONE-ACETAMINOPHEN 7.5-325 MG PO TABS: 1.0000 | ORAL_TABLET | Freq: Four times a day (QID) | ORAL | 0 refills | Status: DC | PRN

## 2020-11-02 ENCOUNTER — Telehealth: Payer: Self-pay | Admitting: Orthopaedic Surgery

## 2020-11-06 MED ORDER — HYDROCODONE-ACETAMINOPHEN 7.5-325 MG PO TABS: 1.0000 | ORAL_TABLET | Freq: Four times a day (QID) | ORAL | 0 refills | Status: DC | PRN

## 2020-12-05 ENCOUNTER — Encounter: Payer: Self-pay | Admitting: Orthopaedic Surgery

## 2020-12-05 ENCOUNTER — Other Ambulatory Visit: Payer: Self-pay

## 2020-12-05 ENCOUNTER — Ambulatory Visit (INDEPENDENT_AMBULATORY_CARE_PROVIDER_SITE_OTHER): Payer: Medicare HMO | Admitting: Orthopaedic Surgery

## 2020-12-05 VITALS — BP 121/65 | HR 81 | Ht 69.0 in | Wt 213.0 lb

## 2020-12-05 DIAGNOSIS — M79642 Pain in left hand: Secondary | ICD-10-CM | POA: Diagnosis not present

## 2020-12-05 DIAGNOSIS — M1711 Unilateral primary osteoarthritis, right knee: Secondary | ICD-10-CM | POA: Diagnosis not present

## 2020-12-05 DIAGNOSIS — M79641 Pain in right hand: Secondary | ICD-10-CM

## 2020-12-05 MED ORDER — HYDROCODONE-ACETAMINOPHEN 7.5-325 MG PO TABS: 1.0000 | ORAL_TABLET | Freq: Four times a day (QID) | ORAL | 0 refills | Status: DC | PRN

## 2020-12-05 NOTE — Progress Notes (Signed)
My hands hurt some days.  He has less hand pains in the summer months.  He still has some swelling depending on what he does.  He has more pain in the mornings.  He is using the Voltaren Gel which helps.  ROM of the fingers is good. He has no swelling today.  NV intact.  Encounter Diagnoses  Name Primary?   Bilateral hand pain Yes   Primary osteoarthritis of right knee    I have refilled his pain medicine.  Continue the voltaren gel.  Return in three months.  Call if any problem.  Precautions discussed.  Electronically Signed Sanjuana Kava, MD 7/19/202211:11 AM

## 2021-01-03 ENCOUNTER — Telehealth: Payer: Self-pay | Admitting: Orthopaedic Surgery

## 2021-01-03 MED ORDER — HYDROCODONE-ACETAMINOPHEN 7.5-325 MG PO TABS: 1.0000 | ORAL_TABLET | Freq: Four times a day (QID) | ORAL | 0 refills | Status: DC | PRN

## 2021-01-03 NOTE — Telephone Encounter (Signed)
Patient called in requesting refill on pain medicine    HYDROcodone-acetaminophen (NORCO) 7.5-325 MG tablet  Pharmacy : [facility] in [place]

## 2021-02-05 ENCOUNTER — Telehealth: Payer: Self-pay | Admitting: Orthopaedic Surgery

## 2021-02-06 MED ORDER — HYDROCODONE-ACETAMINOPHEN 7.5-325 MG PO TABS: 1.0000 | ORAL_TABLET | Freq: Four times a day (QID) | ORAL | 0 refills | Status: DC | PRN

## 2021-03-06 ENCOUNTER — Encounter: Payer: Self-pay | Admitting: Orthopaedic Surgery

## 2021-03-06 ENCOUNTER — Other Ambulatory Visit: Payer: Self-pay

## 2021-03-06 ENCOUNTER — Ambulatory Visit (INDEPENDENT_AMBULATORY_CARE_PROVIDER_SITE_OTHER): Payer: Medicare HMO | Admitting: Orthopaedic Surgery

## 2021-03-06 VITALS — BP 174/81 | HR 95 | Ht 69.0 in | Wt 214.0 lb

## 2021-03-06 DIAGNOSIS — M79642 Pain in left hand: Secondary | ICD-10-CM | POA: Diagnosis not present

## 2021-03-06 DIAGNOSIS — M79641 Pain in right hand: Secondary | ICD-10-CM

## 2021-03-06 MED ORDER — HYDROCODONE-ACETAMINOPHEN 7.5-325 MG PO TABS: 1.0000 | ORAL_TABLET | Freq: Four times a day (QID) | ORAL | 0 refills | Status: DC | PRN

## 2021-03-06 NOTE — Progress Notes (Signed)
My hands hurt more today because it is so cold.  It was in the upper 30's this morning and he says he can feel it in his hands.  He has no new trauma, no numbness.  His arthritis is worse on cold days.  He is taking his medicine.  Both hands have arthritic deformities but good ROM.  NV intact.  Wrist have good ROM.  Encounter Diagnosis  Name Primary?   Bilateral hand pain Yes   I have refilled his pain medicine.  I have reviewed the [place] web site prior to prescribing narcotic medicine for this patient.  Return in three months.  Call if any problem.  Precautions discussed.  Electronically Signed Sanjuana Kava, MD 10/18/20229:57 AM

## 2021-04-03 ENCOUNTER — Telehealth: Payer: Self-pay | Admitting: Orthopaedic Surgery

## 2021-04-03 MED ORDER — HYDROCODONE-ACETAMINOPHEN 7.5-325 MG PO TABS: 1.0000 | ORAL_TABLET | Freq: Four times a day (QID) | ORAL | 0 refills | Status: DC | PRN

## 2021-05-01 DIAGNOSIS — L570 Actinic keratosis: Secondary | ICD-10-CM | POA: Diagnosis not present

## 2021-05-01 DIAGNOSIS — L820 Inflamed seborrheic keratosis: Secondary | ICD-10-CM | POA: Diagnosis not present

## 2021-05-01 DIAGNOSIS — X32XXXD Exposure to sunlight, subsequent encounter: Secondary | ICD-10-CM | POA: Diagnosis not present

## 2021-05-03 ENCOUNTER — Telehealth: Payer: Self-pay | Admitting: Orthopaedic Surgery

## 2021-05-03 MED ORDER — HYDROCODONE-ACETAMINOPHEN 7.5-325 MG PO TABS: 1.0000 | ORAL_TABLET | Freq: Four times a day (QID) | ORAL | 0 refills | Status: DC | PRN

## 2021-05-16 DIAGNOSIS — F064 Anxiety disorder due to known physiological condition: Secondary | ICD-10-CM | POA: Diagnosis not present

## 2021-05-16 DIAGNOSIS — E785 Hyperlipidemia, unspecified: Secondary | ICD-10-CM | POA: Diagnosis not present

## 2021-05-16 DIAGNOSIS — I10 Essential (primary) hypertension: Secondary | ICD-10-CM | POA: Diagnosis not present

## 2021-05-16 DIAGNOSIS — J449 Chronic obstructive pulmonary disease, unspecified: Secondary | ICD-10-CM | POA: Diagnosis not present

## 2021-05-16 DIAGNOSIS — Z6835 Body mass index (BMI) 35.0-35.9, adult: Secondary | ICD-10-CM | POA: Diagnosis not present

## 2021-05-30 DIAGNOSIS — R0609 Other forms of dyspnea: Secondary | ICD-10-CM | POA: Diagnosis not present

## 2021-05-30 DIAGNOSIS — J410 Simple chronic bronchitis: Secondary | ICD-10-CM | POA: Diagnosis not present

## 2021-05-30 DIAGNOSIS — J449 Chronic obstructive pulmonary disease, unspecified: Secondary | ICD-10-CM | POA: Diagnosis not present

## 2021-05-30 DIAGNOSIS — Z6832 Body mass index (BMI) 32.0-32.9, adult: Secondary | ICD-10-CM | POA: Diagnosis not present

## 2021-05-30 DIAGNOSIS — Z87891 Personal history of nicotine dependence: Secondary | ICD-10-CM | POA: Diagnosis not present

## 2021-05-30 DIAGNOSIS — R918 Other nonspecific abnormal finding of lung field: Secondary | ICD-10-CM | POA: Diagnosis not present

## 2021-06-05 ENCOUNTER — Encounter: Payer: Self-pay | Admitting: Orthopaedic Surgery

## 2021-06-05 ENCOUNTER — Ambulatory Visit: Payer: Medicare HMO | Admitting: Orthopaedic Surgery

## 2021-06-05 ENCOUNTER — Other Ambulatory Visit: Payer: Self-pay

## 2021-06-05 VITALS — BP 146/104 | HR 89 | Ht 68.5 in | Wt 214.0 lb

## 2021-06-05 DIAGNOSIS — M79642 Pain in left hand: Secondary | ICD-10-CM | POA: Diagnosis not present

## 2021-06-05 DIAGNOSIS — M79641 Pain in right hand: Secondary | ICD-10-CM

## 2021-06-05 MED ORDER — HYDROCODONE-ACETAMINOPHEN 7.5-325 MG PO TABS: 1.0000 | ORAL_TABLET | Freq: Four times a day (QID) | ORAL | 0 refills | Status: DC | PRN

## 2021-06-05 NOTE — Progress Notes (Signed)
My hands are better.  He has less pain in the hands.  It is a cold rainy day and he has less swelling than the last time I saw him.  He has no new trauma.  He is taking his medicine.  Both hands are tender but have no swelling.  His motion of the fingers is good today.  NV intact.  Encounter Diagnosis  Name Primary?   Bilateral hand pain Yes   I will refill pain medicine.  I have reviewed the [place] web site prior to prescribing narcotic medicine for this patient.   Return in three months.  Call if any problem.  Precautions discussed.  Electronically Signed Sanjuana Kava, MD 1/17/202310:09 AM

## 2021-07-04 ENCOUNTER — Telehealth: Payer: Self-pay | Admitting: Orthopaedic Surgery

## 2021-07-04 MED ORDER — HYDROCODONE-ACETAMINOPHEN 7.5-325 MG PO TABS: 1.0000 | ORAL_TABLET | Freq: Four times a day (QID) | ORAL | 0 refills | Status: DC | PRN

## 2021-07-04 NOTE — Telephone Encounter (Signed)
No new note. 

## 2021-07-04 NOTE — Telephone Encounter (Signed)
Called patient, notified.

## 2021-07-31 ENCOUNTER — Telehealth: Payer: Self-pay | Admitting: Orthopaedic Surgery

## 2021-07-31 DIAGNOSIS — L570 Actinic keratosis: Secondary | ICD-10-CM | POA: Diagnosis not present

## 2021-07-31 DIAGNOSIS — X32XXXD Exposure to sunlight, subsequent encounter: Secondary | ICD-10-CM | POA: Diagnosis not present

## 2021-07-31 MED ORDER — HYDROCODONE-ACETAMINOPHEN 7.5-325 MG PO TABS: 1.0000 | ORAL_TABLET | Freq: Four times a day (QID) | ORAL | 0 refills | Status: DC | PRN

## 2021-08-15 DIAGNOSIS — Z6835 Body mass index (BMI) 35.0-35.9, adult: Secondary | ICD-10-CM | POA: Diagnosis not present

## 2021-08-15 DIAGNOSIS — I10 Essential (primary) hypertension: Secondary | ICD-10-CM | POA: Diagnosis not present

## 2021-08-15 DIAGNOSIS — F064 Anxiety disorder due to known physiological condition: Secondary | ICD-10-CM | POA: Diagnosis not present

## 2021-08-15 DIAGNOSIS — E785 Hyperlipidemia, unspecified: Secondary | ICD-10-CM | POA: Diagnosis not present

## 2021-08-15 DIAGNOSIS — J449 Chronic obstructive pulmonary disease, unspecified: Secondary | ICD-10-CM | POA: Diagnosis not present

## 2021-09-04 ENCOUNTER — Encounter: Payer: Self-pay | Admitting: Orthopaedic Surgery

## 2021-09-04 ENCOUNTER — Ambulatory Visit: Payer: Medicare HMO | Admitting: Orthopaedic Surgery

## 2021-09-04 VITALS — BP 159/79 | HR 85 | Ht 68.5 in | Wt 222.0 lb

## 2021-09-04 DIAGNOSIS — M79642 Pain in left hand: Secondary | ICD-10-CM | POA: Diagnosis not present

## 2021-09-04 DIAGNOSIS — M1711 Unilateral primary osteoarthritis, right knee: Secondary | ICD-10-CM

## 2021-09-04 DIAGNOSIS — M79641 Pain in right hand: Secondary | ICD-10-CM

## 2021-09-04 MED ORDER — HYDROCODONE-ACETAMINOPHEN 7.5-325 MG PO TABS: 1.0000 | ORAL_TABLET | Freq: Four times a day (QID) | ORAL | 0 refills | Status: DC | PRN

## 2021-09-04 NOTE — Progress Notes (Signed)
My hands are a little better. ? ?He has chronic pain in both hands.  He has had less pain as the weather has warmed up.  He has no new trauma. ? ?His right knee is tender most days but he has less swelling and pain there as well.  He has no giving way. ? ?Both hands have slight tenderness and slight degenerative changes but ROM is good and NV intact. ? ?Right knee has slight effusion, crepitus, ROM 1 to 110, stable. ? ?Encounter Diagnoses  ?Name Primary?  ? Bilateral hand pain Yes  ? Primary osteoarthritis of right knee   ? ?I will refill his pain medicine. ? ?Return in three months. ? ?Call if any problem. ? ?Precautions discussed. ? ?I have reviewed the [place] web site prior to prescribing narcotic medicine for this patient. ? ?Electronically Signed ?Sanjuana Kava, MD ?4/18/20239:01 AM ? ?

## 2021-09-05 DIAGNOSIS — R918 Other nonspecific abnormal finding of lung field: Secondary | ICD-10-CM | POA: Diagnosis not present

## 2021-09-05 DIAGNOSIS — R0602 Shortness of breath: Secondary | ICD-10-CM | POA: Diagnosis not present

## 2021-09-05 DIAGNOSIS — Z87891 Personal history of nicotine dependence: Secondary | ICD-10-CM | POA: Diagnosis not present

## 2021-09-05 DIAGNOSIS — J449 Chronic obstructive pulmonary disease, unspecified: Secondary | ICD-10-CM | POA: Diagnosis not present

## 2021-09-05 DIAGNOSIS — J418 Mixed simple and mucopurulent chronic bronchitis: Secondary | ICD-10-CM | POA: Diagnosis not present

## 2021-09-05 DIAGNOSIS — R0609 Other forms of dyspnea: Secondary | ICD-10-CM | POA: Diagnosis not present

## 2021-10-02 ENCOUNTER — Other Ambulatory Visit: Payer: Self-pay | Admitting: Orthopaedic Surgery

## 2021-10-02 MED ORDER — HYDROCODONE-ACETAMINOPHEN 7.5-325 MG PO TABS: 1.0000 | ORAL_TABLET | Freq: Four times a day (QID) | ORAL | 0 refills | Status: DC | PRN

## 2021-10-09 ENCOUNTER — Other Ambulatory Visit: Payer: Self-pay | Admitting: Orthopaedic Surgery

## 2021-10-09 MED ORDER — HYDROCODONE-ACETAMINOPHEN 7.5-325 MG PO TABS: 1.0000 | ORAL_TABLET | Freq: Four times a day (QID) | ORAL | 0 refills | Status: AC | PRN

## 2021-10-09 NOTE — Telephone Encounter (Signed)
Patient of Dr Brooke Bonito requests refill - aware to be reviewed by other providers in Dr Brooke Bonito absence HYDROcodone-acetaminophen ([place]) 7.5-325 MG tablet 28 tablet      [facility] in [place], [place] (Same pharmacy)

## 2021-10-17 ENCOUNTER — Other Ambulatory Visit: Payer: Self-pay

## 2021-10-17 NOTE — Telephone Encounter (Signed)
Hydrocodone-Acetaminophen 7.5/325 MG  Qty 20 Tablets  Take 1 tablet by mouth every 6 (six) hours as needed for up to 5 days for moderate pain.  PATIENT USES [facility] [address] IN [place]

## 2021-10-18 MED ORDER — HYDROCODONE-ACETAMINOPHEN 5-325 MG PO TABS: 1.0000 | ORAL_TABLET | Freq: Four times a day (QID) | ORAL | 0 refills | Status: DC | PRN

## 2021-10-23 ENCOUNTER — Telehealth: Payer: Self-pay | Admitting: Orthopaedic Surgery

## 2021-10-23 MED ORDER — HYDROCODONE-ACETAMINOPHEN 5-325 MG PO TABS: 1.0000 | ORAL_TABLET | Freq: Four times a day (QID) | ORAL | 0 refills | Status: DC | PRN

## 2021-10-23 NOTE — Telephone Encounter (Signed)
Patient requests refill: *requests the following strength/dosage instructions "not what the last doctor ordered which was 5-325 - please advise" HYDROcodone-acetaminophen (NORCO) 7.5-325 MG tablet [place], [place]

## 2021-11-14 DIAGNOSIS — E785 Hyperlipidemia, unspecified: Secondary | ICD-10-CM | POA: Diagnosis not present

## 2021-11-14 DIAGNOSIS — F064 Anxiety disorder due to known physiological condition: Secondary | ICD-10-CM | POA: Diagnosis not present

## 2021-11-14 DIAGNOSIS — Z131 Encounter for screening for diabetes mellitus: Secondary | ICD-10-CM | POA: Diagnosis not present

## 2021-11-14 DIAGNOSIS — I10 Essential (primary) hypertension: Secondary | ICD-10-CM | POA: Diagnosis not present

## 2021-11-14 DIAGNOSIS — J449 Chronic obstructive pulmonary disease, unspecified: Secondary | ICD-10-CM | POA: Diagnosis not present

## 2021-11-14 DIAGNOSIS — Z6835 Body mass index (BMI) 35.0-35.9, adult: Secondary | ICD-10-CM | POA: Diagnosis not present

## 2021-11-14 DIAGNOSIS — R7303 Prediabetes: Secondary | ICD-10-CM | POA: Diagnosis not present

## 2021-11-14 DIAGNOSIS — M5459 Other low back pain: Secondary | ICD-10-CM | POA: Diagnosis not present

## 2021-11-14 DIAGNOSIS — Z0000 Encounter for general adult medical examination without abnormal findings: Secondary | ICD-10-CM | POA: Diagnosis not present

## 2021-11-21 ENCOUNTER — Other Ambulatory Visit: Payer: Self-pay | Admitting: Orthopaedic Surgery

## 2021-11-21 MED ORDER — HYDROCODONE-ACETAMINOPHEN 5-325 MG PO TABS: 1.0000 | ORAL_TABLET | Freq: Four times a day (QID) | ORAL | 0 refills | Status: DC | PRN

## 2021-11-27 DIAGNOSIS — L570 Actinic keratosis: Secondary | ICD-10-CM | POA: Diagnosis not present

## 2021-11-27 DIAGNOSIS — X32XXXD Exposure to sunlight, subsequent encounter: Secondary | ICD-10-CM | POA: Diagnosis not present

## 2021-12-04 ENCOUNTER — Ambulatory Visit: Payer: Medicare HMO | Admitting: Orthopaedic Surgery

## 2021-12-04 ENCOUNTER — Encounter: Payer: Self-pay | Admitting: Orthopaedic Surgery

## 2021-12-04 VITALS — BP 123/55 | HR 86 | Ht 68.5 in | Wt 220.0 lb

## 2021-12-04 DIAGNOSIS — M79642 Pain in left hand: Secondary | ICD-10-CM | POA: Diagnosis not present

## 2021-12-04 DIAGNOSIS — M79641 Pain in right hand: Secondary | ICD-10-CM

## 2021-12-04 MED ORDER — HYDROCODONE-ACETAMINOPHEN 7.5-325 MG PO TABS: 1.0000 | ORAL_TABLET | Freq: Four times a day (QID) | ORAL | 0 refills | Status: DC | PRN

## 2021-12-04 NOTE — Progress Notes (Signed)
My hands swell now and then  His hands have had less pain with the warmer weather.  He has swelling at times and has no new trauma.  He is taking his medicine.  Both hands have some slight fusiform swelling but good ROM.  NV intact.  Encounter Diagnosis  Name Primary?   Bilateral hand pain Yes   I have refilled pain medicine.  I have reviewed the [place] web site prior to prescribing narcotic medicine for this patient.  Return in three months.  Call if any problem.  Precautions discussed.  Electronically Signed Sanjuana Kava, MD 7/18/202310:37 AM

## 2022-01-01 ENCOUNTER — Telehealth: Payer: Self-pay | Admitting: Orthopaedic Surgery

## 2022-01-01 MED ORDER — HYDROCODONE-ACETAMINOPHEN 7.5-325 MG PO TABS: 1.0000 | ORAL_TABLET | Freq: Four times a day (QID) | ORAL | 0 refills | Status: DC | PRN

## 2022-01-29 ENCOUNTER — Telehealth: Payer: Self-pay | Admitting: Orthopaedic Surgery

## 2022-01-29 MED ORDER — HYDROCODONE-ACETAMINOPHEN 7.5-325 MG PO TABS: 1.0000 | ORAL_TABLET | Freq: Four times a day (QID) | ORAL | 0 refills | Status: DC | PRN

## 2022-02-13 DIAGNOSIS — F064 Anxiety disorder due to known physiological condition: Secondary | ICD-10-CM | POA: Diagnosis not present

## 2022-02-13 DIAGNOSIS — Z0000 Encounter for general adult medical examination without abnormal findings: Secondary | ICD-10-CM | POA: Diagnosis not present

## 2022-02-13 DIAGNOSIS — J449 Chronic obstructive pulmonary disease, unspecified: Secondary | ICD-10-CM | POA: Diagnosis not present

## 2022-02-13 DIAGNOSIS — M5459 Other low back pain: Secondary | ICD-10-CM | POA: Diagnosis not present

## 2022-02-13 DIAGNOSIS — Z6835 Body mass index (BMI) 35.0-35.9, adult: Secondary | ICD-10-CM | POA: Diagnosis not present

## 2022-02-13 DIAGNOSIS — I10 Essential (primary) hypertension: Secondary | ICD-10-CM | POA: Diagnosis not present

## 2022-02-13 DIAGNOSIS — E785 Hyperlipidemia, unspecified: Secondary | ICD-10-CM | POA: Diagnosis not present

## 2022-02-20 DIAGNOSIS — Z136 Encounter for screening for cardiovascular disorders: Secondary | ICD-10-CM | POA: Diagnosis not present

## 2022-02-20 DIAGNOSIS — Z87891 Personal history of nicotine dependence: Secondary | ICD-10-CM | POA: Diagnosis not present

## 2022-02-20 DIAGNOSIS — F17210 Nicotine dependence, cigarettes, uncomplicated: Secondary | ICD-10-CM | POA: Diagnosis not present

## 2022-02-26 ENCOUNTER — Other Ambulatory Visit: Payer: Self-pay | Admitting: Orthopaedic Surgery

## 2022-02-26 MED ORDER — HYDROCODONE-ACETAMINOPHEN 7.5-325 MG PO TABS: 1.0000 | ORAL_TABLET | Freq: Four times a day (QID) | ORAL | 0 refills | Status: DC | PRN

## 2022-03-05 ENCOUNTER — Telehealth: Payer: Self-pay | Admitting: Orthopedic Surgery

## 2022-03-05 MED ORDER — HYDROCODONE-ACETAMINOPHEN 7.5-325 MG PO TABS: ORAL_TABLET | ORAL | 0 refills | Status: DC

## 2022-03-06 ENCOUNTER — Ambulatory Visit: Payer: Medicare HMO | Admitting: Orthopaedic Surgery

## 2022-03-06 ENCOUNTER — Encounter: Payer: Self-pay | Admitting: Orthopaedic Surgery

## 2022-03-06 DIAGNOSIS — M1711 Unilateral primary osteoarthritis, right knee: Secondary | ICD-10-CM

## 2022-03-06 DIAGNOSIS — M79642 Pain in left hand: Secondary | ICD-10-CM | POA: Diagnosis not present

## 2022-03-06 DIAGNOSIS — M79641 Pain in right hand: Secondary | ICD-10-CM | POA: Diagnosis not present

## 2022-03-06 NOTE — Patient Instructions (Signed)
As the weather changes and gets cooler, you may notice you are affected more. You may have more pain in your joints. This is normal. Dress warmly and make sure that area is covered well.   

## 2022-03-06 NOTE — Progress Notes (Signed)
I am better today.  He has bilateral hand pain and swelling at times.  It is worse on cold days and rainy days.  He has no new trauma.  His right knee is not hurting today.  Both hands have some degenerative changes but no fusiform swelling.  NV intact.  Grips good.  Right knee has crepitus but near full ROM today, no limp.  Encounter Diagnoses  Name Primary?   Bilateral hand pain Yes   Primary osteoarthritis of right knee    I will see him in three months.  Call if any problem.  Precautions discussed.  Electronically Signed Sanjuana Kava, MD 10/18/20239:57 AM

## 2022-03-08 DIAGNOSIS — R0609 Other forms of dyspnea: Secondary | ICD-10-CM | POA: Diagnosis not present

## 2022-03-08 DIAGNOSIS — Z87891 Personal history of nicotine dependence: Secondary | ICD-10-CM | POA: Diagnosis not present

## 2022-03-08 DIAGNOSIS — J449 Chronic obstructive pulmonary disease, unspecified: Secondary | ICD-10-CM | POA: Diagnosis not present

## 2022-03-08 DIAGNOSIS — R918 Other nonspecific abnormal finding of lung field: Secondary | ICD-10-CM | POA: Diagnosis not present

## 2022-03-08 DIAGNOSIS — J410 Simple chronic bronchitis: Secondary | ICD-10-CM | POA: Diagnosis not present

## 2022-04-02 ENCOUNTER — Telehealth: Payer: Self-pay | Admitting: Orthopaedic Surgery

## 2022-04-02 DIAGNOSIS — C44629 Squamous cell carcinoma of skin of left upper limb, including shoulder: Secondary | ICD-10-CM | POA: Diagnosis not present

## 2022-04-02 DIAGNOSIS — C44311 Basal cell carcinoma of skin of nose: Secondary | ICD-10-CM | POA: Diagnosis not present

## 2022-04-02 MED ORDER — HYDROCODONE-ACETAMINOPHEN 7.5-325 MG PO TABS: ORAL_TABLET | ORAL | 0 refills | Status: DC

## 2022-04-25 DIAGNOSIS — H524 Presbyopia: Secondary | ICD-10-CM | POA: Diagnosis not present

## 2022-04-30 ENCOUNTER — Telehealth: Payer: Self-pay | Admitting: Orthopaedic Surgery

## 2022-04-30 MED ORDER — HYDROCODONE-ACETAMINOPHEN 7.5-325 MG PO TABS: ORAL_TABLET | ORAL | 0 refills | Status: DC

## 2022-05-02 ENCOUNTER — Telehealth: Payer: Self-pay | Admitting: Orthopaedic Surgery

## 2022-05-02 MED ORDER — HYDROCODONE-ACETAMINOPHEN 7.5-325 MG PO TABS: ORAL_TABLET | ORAL | 0 refills | Status: DC

## 2022-05-02 NOTE — Telephone Encounter (Signed)
Spoke with patient. He asked that prescription be re-sent to [facility] in [place]. Request sent to provider who sent prescription to [facility] per patient request.

## 2022-05-02 NOTE — Telephone Encounter (Signed)
Patient called, stated that his pharmacy doesn't have the 7.5 Oxycodone and they do not know when they will have it.  He is requesting something else be called in.  Lilli Light - pt's # [phone]

## 2022-05-15 DIAGNOSIS — M5459 Other low back pain: Secondary | ICD-10-CM | POA: Diagnosis not present

## 2022-05-15 DIAGNOSIS — I10 Essential (primary) hypertension: Secondary | ICD-10-CM | POA: Diagnosis not present

## 2022-05-15 DIAGNOSIS — E785 Hyperlipidemia, unspecified: Secondary | ICD-10-CM | POA: Diagnosis not present

## 2022-05-15 DIAGNOSIS — F064 Anxiety disorder due to known physiological condition: Secondary | ICD-10-CM | POA: Diagnosis not present

## 2022-05-15 DIAGNOSIS — Z6836 Body mass index (BMI) 36.0-36.9, adult: Secondary | ICD-10-CM | POA: Diagnosis not present

## 2022-05-15 DIAGNOSIS — E119 Type 2 diabetes mellitus without complications: Secondary | ICD-10-CM | POA: Diagnosis not present

## 2022-05-15 DIAGNOSIS — J449 Chronic obstructive pulmonary disease, unspecified: Secondary | ICD-10-CM | POA: Diagnosis not present

## 2022-06-06 ENCOUNTER — Ambulatory Visit: Payer: Medicare HMO | Admitting: Orthopaedic Surgery

## 2022-06-06 ENCOUNTER — Encounter: Payer: Self-pay | Admitting: Orthopaedic Surgery

## 2022-06-06 VITALS — BP 150/64 | HR 87 | Ht 68.5 in | Wt 226.0 lb

## 2022-06-06 DIAGNOSIS — M79642 Pain in left hand: Secondary | ICD-10-CM | POA: Diagnosis not present

## 2022-06-06 DIAGNOSIS — M79641 Pain in right hand: Secondary | ICD-10-CM | POA: Diagnosis not present

## 2022-06-06 NOTE — Progress Notes (Signed)
My hands hurt more in the cold.  He has pain of both hands and morning tenderness and swelling.  It takes a while for him to get his fingers moving.  The cold weather has made it worse.  He has no new trauma.  Fingers have mild fusiform swelling of the fingers little to index bilaterally with PIP tenderness. ROM is good.  NV intact.  Encounter Diagnosis  Name Primary?   Bilateral hand pain Yes   Return in three months.  Call if any problem.  Precautions discussed.  Electronically Signed Sanjuana Kava, MD 1/18/20249:37 AM

## 2022-06-26 DIAGNOSIS — Z85828 Personal history of other malignant neoplasm of skin: Secondary | ICD-10-CM | POA: Diagnosis not present

## 2022-06-26 DIAGNOSIS — Z08 Encounter for follow-up examination after completed treatment for malignant neoplasm: Secondary | ICD-10-CM | POA: Diagnosis not present

## 2022-06-26 DIAGNOSIS — X32XXXD Exposure to sunlight, subsequent encounter: Secondary | ICD-10-CM | POA: Diagnosis not present

## 2022-06-26 DIAGNOSIS — L570 Actinic keratosis: Secondary | ICD-10-CM | POA: Diagnosis not present

## 2022-06-27 ENCOUNTER — Other Ambulatory Visit: Payer: Self-pay | Admitting: Orthopaedic Surgery

## 2022-07-01 ENCOUNTER — Telehealth: Payer: Self-pay | Admitting: Orthopaedic Surgery

## 2022-07-01 MED ORDER — HYDROCODONE-ACETAMINOPHEN 7.5-325 MG PO TABS: ORAL_TABLET | ORAL | 0 refills | Status: DC

## 2022-07-01 NOTE — Telephone Encounter (Signed)
Patient called, lvm for Dr. Brooke Bonito nurse/assistant to call him, no details.  [phone]

## 2022-07-02 NOTE — Telephone Encounter (Signed)
CALLED PATIENT. NO ANSWER. LEFT VM ASKING FOR CALL BACK OR HE CAN SEND A MYCHART MESSAGE.

## 2022-07-18 ENCOUNTER — Encounter: Payer: Self-pay | Admitting: Radiology

## 2022-07-30 ENCOUNTER — Telehealth: Payer: Self-pay | Admitting: Orthopaedic Surgery

## 2022-07-30 MED ORDER — HYDROCODONE-ACETAMINOPHEN 7.5-325 MG PO TABS: ORAL_TABLET | ORAL | 0 refills | Status: DC

## 2022-08-12 DIAGNOSIS — Z6836 Body mass index (BMI) 36.0-36.9, adult: Secondary | ICD-10-CM | POA: Diagnosis not present

## 2022-08-12 DIAGNOSIS — F064 Anxiety disorder due to known physiological condition: Secondary | ICD-10-CM | POA: Diagnosis not present

## 2022-08-12 DIAGNOSIS — F5101 Primary insomnia: Secondary | ICD-10-CM | POA: Diagnosis not present

## 2022-08-12 DIAGNOSIS — J449 Chronic obstructive pulmonary disease, unspecified: Secondary | ICD-10-CM | POA: Diagnosis not present

## 2022-08-12 DIAGNOSIS — M5459 Other low back pain: Secondary | ICD-10-CM | POA: Diagnosis not present

## 2022-08-12 DIAGNOSIS — Z0000 Encounter for general adult medical examination without abnormal findings: Secondary | ICD-10-CM | POA: Diagnosis not present

## 2022-08-12 DIAGNOSIS — I10 Essential (primary) hypertension: Secondary | ICD-10-CM | POA: Diagnosis not present

## 2022-08-12 DIAGNOSIS — E785 Hyperlipidemia, unspecified: Secondary | ICD-10-CM | POA: Diagnosis not present

## 2022-08-27 ENCOUNTER — Telehealth: Payer: Self-pay | Admitting: Orthopaedic Surgery

## 2022-08-27 MED ORDER — HYDROCODONE-ACETAMINOPHEN 7.5-325 MG PO TABS: ORAL_TABLET | ORAL | 0 refills | Status: DC

## 2022-09-05 ENCOUNTER — Ambulatory Visit: Payer: Medicare HMO | Admitting: Orthopaedic Surgery

## 2022-09-05 ENCOUNTER — Encounter: Payer: Self-pay | Admitting: Orthopaedic Surgery

## 2022-09-05 VITALS — BP 135/66 | HR 85 | Ht 68.5 in | Wt 228.0 lb

## 2022-09-05 DIAGNOSIS — M79642 Pain in left hand: Secondary | ICD-10-CM

## 2022-09-05 DIAGNOSIS — M79641 Pain in right hand: Secondary | ICD-10-CM | POA: Diagnosis not present

## 2022-09-05 NOTE — Progress Notes (Signed)
My hands are tender today.  He has bilateral hand pain and swelling, more in the mornings.  He has no new trauma.  He has no redness.  He is taking his medicine and doing his usual morning routines.  He has some swelling of the PIP joints of the right and left hands with slight fusiform swelling.  ROM is good.  NV intact.  Encounter Diagnosis  Name Primary?   Bilateral hand pain Yes   Continue his medicine and exercises.  Return in three months.  Call if any problem.  Precautions discussed.  Electronically Signed Darreld Mclean, MD 4/18/20249:54 AM

## 2022-09-10 DIAGNOSIS — Z7952 Long term (current) use of systemic steroids: Secondary | ICD-10-CM | POA: Diagnosis not present

## 2022-09-10 DIAGNOSIS — M810 Age-related osteoporosis without current pathological fracture: Secondary | ICD-10-CM | POA: Diagnosis not present

## 2022-09-12 DIAGNOSIS — Z87891 Personal history of nicotine dependence: Secondary | ICD-10-CM | POA: Diagnosis not present

## 2022-09-12 DIAGNOSIS — R0609 Other forms of dyspnea: Secondary | ICD-10-CM | POA: Diagnosis not present

## 2022-09-12 DIAGNOSIS — J410 Simple chronic bronchitis: Secondary | ICD-10-CM | POA: Diagnosis not present

## 2022-09-24 ENCOUNTER — Telehealth: Payer: Self-pay | Admitting: Orthopaedic Surgery

## 2022-09-24 MED ORDER — HYDROCODONE-ACETAMINOPHEN 7.5-325 MG PO TABS: ORAL_TABLET | ORAL | 0 refills | Status: DC

## 2022-10-29 ENCOUNTER — Telehealth: Payer: Self-pay | Admitting: Orthopaedic Surgery

## 2022-10-29 ENCOUNTER — Telehealth: Payer: Self-pay

## 2022-10-29 MED ORDER — HYDROCODONE-ACETAMINOPHEN 7.5-325 MG PO TABS: ORAL_TABLET | ORAL | 0 refills | Status: DC

## 2022-10-30 MED ORDER — HYDROCODONE-ACETAMINOPHEN 7.5-325 MG PO TABS: ORAL_TABLET | ORAL | 0 refills | Status: DC

## 2022-10-31 NOTE — Telephone Encounter (Signed)
Med refill sent

## 2022-11-11 DIAGNOSIS — J449 Chronic obstructive pulmonary disease, unspecified: Secondary | ICD-10-CM | POA: Diagnosis not present

## 2022-11-11 DIAGNOSIS — M5459 Other low back pain: Secondary | ICD-10-CM | POA: Diagnosis not present

## 2022-11-11 DIAGNOSIS — E785 Hyperlipidemia, unspecified: Secondary | ICD-10-CM | POA: Diagnosis not present

## 2022-11-11 DIAGNOSIS — F064 Anxiety disorder due to known physiological condition: Secondary | ICD-10-CM | POA: Diagnosis not present

## 2022-11-11 DIAGNOSIS — Z6836 Body mass index (BMI) 36.0-36.9, adult: Secondary | ICD-10-CM | POA: Diagnosis not present

## 2022-11-11 DIAGNOSIS — F5101 Primary insomnia: Secondary | ICD-10-CM | POA: Diagnosis not present

## 2022-11-11 DIAGNOSIS — Z0000 Encounter for general adult medical examination without abnormal findings: Secondary | ICD-10-CM | POA: Diagnosis not present

## 2022-11-11 DIAGNOSIS — I10 Essential (primary) hypertension: Secondary | ICD-10-CM | POA: Diagnosis not present

## 2022-11-26 ENCOUNTER — Telehealth: Payer: Self-pay | Admitting: Orthopaedic Surgery

## 2022-11-26 MED ORDER — HYDROCODONE-ACETAMINOPHEN 7.5-325 MG PO TABS: ORAL_TABLET | ORAL | 0 refills | Status: DC

## 2022-12-04 ENCOUNTER — Encounter: Payer: Self-pay | Admitting: Orthopaedic Surgery

## 2022-12-04 ENCOUNTER — Ambulatory Visit: Payer: Medicare HMO | Admitting: Orthopaedic Surgery

## 2022-12-04 VITALS — BP 163/80 | HR 72 | Ht 68.5 in

## 2022-12-04 DIAGNOSIS — M79642 Pain in left hand: Secondary | ICD-10-CM | POA: Diagnosis not present

## 2022-12-04 DIAGNOSIS — M79641 Pain in right hand: Secondary | ICD-10-CM

## 2022-12-04 NOTE — Progress Notes (Signed)
My right hand is bothering me more  He has arthritis of the fingers, more on the right.  The right hand usually does better in the summer but not this year.  He has no new trauma.  He has pain more of the long and index PIP joints with swelling.  The left hand hurts but not as much as the right.  He is taking his medicine.  He has fusiform swelling of the fingers on the right with changes at the PIP and DIP joints, decreased grip, no redness.  On the left hand he has more DIP changes and no fusiform swelling.  NV intact both hands.  Encounter Diagnosis  Name Primary?   Bilateral hand pain Yes   Continue his medicines.  Return in three months.  Call if any problem.  Precautions discussed.  Electronically Signed Darreld Mclean, MD 7/17/202410:06 AM

## 2022-12-25 DIAGNOSIS — Z08 Encounter for follow-up examination after completed treatment for malignant neoplasm: Secondary | ICD-10-CM | POA: Diagnosis not present

## 2022-12-25 DIAGNOSIS — Z85828 Personal history of other malignant neoplasm of skin: Secondary | ICD-10-CM | POA: Diagnosis not present

## 2022-12-25 DIAGNOSIS — L570 Actinic keratosis: Secondary | ICD-10-CM | POA: Diagnosis not present

## 2022-12-25 DIAGNOSIS — X32XXXD Exposure to sunlight, subsequent encounter: Secondary | ICD-10-CM | POA: Diagnosis not present

## 2022-12-25 DIAGNOSIS — L821 Other seborrheic keratosis: Secondary | ICD-10-CM | POA: Diagnosis not present

## 2022-12-27 ENCOUNTER — Telehealth: Payer: Self-pay | Admitting: Orthopaedic Surgery

## 2022-12-31 MED ORDER — HYDROCODONE-ACETAMINOPHEN 7.5-325 MG PO TABS: ORAL_TABLET | ORAL | 0 refills | Status: DC

## 2023-01-28 ENCOUNTER — Telehealth: Payer: Self-pay | Admitting: Orthopaedic Surgery

## 2023-01-28 MED ORDER — HYDROCODONE-ACETAMINOPHEN 7.5-325 MG PO TABS: ORAL_TABLET | ORAL | 0 refills | Status: DC

## 2023-02-17 DIAGNOSIS — F5101 Primary insomnia: Secondary | ICD-10-CM | POA: Diagnosis not present

## 2023-02-17 DIAGNOSIS — J449 Chronic obstructive pulmonary disease, unspecified: Secondary | ICD-10-CM | POA: Diagnosis not present

## 2023-02-17 DIAGNOSIS — I10 Essential (primary) hypertension: Secondary | ICD-10-CM | POA: Diagnosis not present

## 2023-02-17 DIAGNOSIS — F064 Anxiety disorder due to known physiological condition: Secondary | ICD-10-CM | POA: Diagnosis not present

## 2023-02-17 DIAGNOSIS — Z6835 Body mass index (BMI) 35.0-35.9, adult: Secondary | ICD-10-CM | POA: Diagnosis not present

## 2023-02-17 DIAGNOSIS — E785 Hyperlipidemia, unspecified: Secondary | ICD-10-CM | POA: Diagnosis not present

## 2023-02-17 DIAGNOSIS — M5459 Other low back pain: Secondary | ICD-10-CM | POA: Diagnosis not present

## 2023-02-17 DIAGNOSIS — Z0000 Encounter for general adult medical examination without abnormal findings: Secondary | ICD-10-CM | POA: Diagnosis not present

## 2023-02-25 ENCOUNTER — Telehealth: Payer: Self-pay | Admitting: Orthopaedic Surgery

## 2023-02-25 MED ORDER — HYDROCODONE-ACETAMINOPHEN 7.5-325 MG PO TABS: ORAL_TABLET | ORAL | 0 refills | Status: DC

## 2023-03-04 ENCOUNTER — Ambulatory Visit: Payer: Medicare HMO | Admitting: Orthopaedic Surgery

## 2023-03-04 ENCOUNTER — Encounter: Payer: Self-pay | Admitting: Orthopaedic Surgery

## 2023-03-04 VITALS — BP 139/68 | HR 86 | Ht 68.5 in | Wt 228.0 lb

## 2023-03-04 DIAGNOSIS — M79642 Pain in left hand: Secondary | ICD-10-CM | POA: Diagnosis not present

## 2023-03-04 DIAGNOSIS — M79641 Pain in right hand: Secondary | ICD-10-CM | POA: Diagnosis not present

## 2023-03-04 NOTE — Progress Notes (Signed)
My hands are the same, they still hurt in the mornings.  He has arthritis of both hands, fingers.  They swell at times and hurt more in the mornings.  He uses a rub which helps.  He has no new trauma.  He is taking his medicine.  Both hands have slight fusiform swelling of the fingers, worse on the right dominant long finger.  NV intact. ROM is full.  Encounter Diagnosis  Name Primary?   Bilateral hand pain Yes   Continue his medicine and his rubs.  Return in three months.  Call if any problem.  Precautions discussed.  Electronically Signed Darreld Mclean, MD 10/15/20241:32 PM

## 2023-03-05 ENCOUNTER — Ambulatory Visit: Payer: Medicare HMO | Admitting: Orthopaedic Surgery

## 2023-03-13 DIAGNOSIS — Z87891 Personal history of nicotine dependence: Secondary | ICD-10-CM | POA: Diagnosis not present

## 2023-03-13 DIAGNOSIS — E6609 Other obesity due to excess calories: Secondary | ICD-10-CM | POA: Diagnosis not present

## 2023-03-13 DIAGNOSIS — J410 Simple chronic bronchitis: Secondary | ICD-10-CM | POA: Diagnosis not present

## 2023-03-25 ENCOUNTER — Other Ambulatory Visit: Payer: Self-pay | Admitting: Orthopaedic Surgery

## 2023-03-25 MED ORDER — HYDROCODONE-ACETAMINOPHEN 7.5-325 MG PO TABS: 1.0000 | ORAL_TABLET | Freq: Four times a day (QID) | ORAL | 0 refills | Status: DC | PRN

## 2023-03-26 DIAGNOSIS — E785 Hyperlipidemia, unspecified: Secondary | ICD-10-CM | POA: Diagnosis not present

## 2023-03-26 DIAGNOSIS — I10 Essential (primary) hypertension: Secondary | ICD-10-CM | POA: Diagnosis not present

## 2023-03-26 DIAGNOSIS — F5101 Primary insomnia: Secondary | ICD-10-CM | POA: Diagnosis not present

## 2023-03-26 DIAGNOSIS — F064 Anxiety disorder due to known physiological condition: Secondary | ICD-10-CM | POA: Diagnosis not present

## 2023-04-29 ENCOUNTER — Telehealth: Payer: Self-pay | Admitting: Orthopaedic Surgery

## 2023-04-29 NOTE — Telephone Encounter (Signed)
Called patient and left voice mail to fill the medication for the 28 pills and if he needs a refill to call the office

## 2023-04-29 NOTE — Telephone Encounter (Signed)
Dr. Sanjuan Dame pt - pt lvm stating that the pharmacy only filled 28 pills, he wants to know what he's supposed to do.  He would like a call back at [phone]

## 2023-05-06 ENCOUNTER — Telehealth: Payer: Self-pay | Admitting: Orthopedic Surgery

## 2023-05-19 DIAGNOSIS — F5101 Primary insomnia: Secondary | ICD-10-CM | POA: Diagnosis not present

## 2023-05-19 DIAGNOSIS — I10 Essential (primary) hypertension: Secondary | ICD-10-CM | POA: Diagnosis not present

## 2023-05-19 DIAGNOSIS — M5459 Other low back pain: Secondary | ICD-10-CM | POA: Diagnosis not present

## 2023-05-19 DIAGNOSIS — F411 Generalized anxiety disorder: Secondary | ICD-10-CM | POA: Diagnosis not present

## 2023-05-19 DIAGNOSIS — Z0000 Encounter for general adult medical examination without abnormal findings: Secondary | ICD-10-CM | POA: Diagnosis not present

## 2023-05-19 DIAGNOSIS — J449 Chronic obstructive pulmonary disease, unspecified: Secondary | ICD-10-CM | POA: Diagnosis not present

## 2023-05-19 DIAGNOSIS — E785 Hyperlipidemia, unspecified: Secondary | ICD-10-CM | POA: Diagnosis not present

## 2023-05-19 DIAGNOSIS — Z125 Encounter for screening for malignant neoplasm of prostate: Secondary | ICD-10-CM | POA: Diagnosis not present

## 2023-05-19 DIAGNOSIS — Z6836 Body mass index (BMI) 36.0-36.9, adult: Secondary | ICD-10-CM | POA: Diagnosis not present

## 2023-05-20 ENCOUNTER — Other Ambulatory Visit: Payer: Self-pay | Admitting: Orthopedic Surgery

## 2023-05-22 ENCOUNTER — Telehealth: Payer: Self-pay | Admitting: Orthopaedic Surgery

## 2023-05-23 MED ORDER — HYDROCODONE-ACETAMINOPHEN 7.5-325 MG PO TABS: 1.0000 | ORAL_TABLET | Freq: Four times a day (QID) | ORAL | 0 refills | Status: AC | PRN

## 2023-06-03 ENCOUNTER — Telehealth: Payer: Self-pay | Admitting: Orthopaedic Surgery

## 2023-06-03 ENCOUNTER — Ambulatory Visit: Payer: Medicare HMO | Admitting: Orthopaedic Surgery

## 2023-06-03 MED ORDER — HYDROCODONE-ACETAMINOPHEN 7.5-325 MG PO TABS: 1.0000 | ORAL_TABLET | Freq: Four times a day (QID) | ORAL | 0 refills | Status: DC | PRN

## 2023-06-04 ENCOUNTER — Ambulatory Visit: Payer: Medicare HMO | Admitting: Orthopaedic Surgery

## 2023-06-04 ENCOUNTER — Encounter: Payer: Self-pay | Admitting: Orthopaedic Surgery

## 2023-06-04 VITALS — BP 129/59 | HR 89

## 2023-06-04 DIAGNOSIS — M79642 Pain in left hand: Secondary | ICD-10-CM

## 2023-06-04 DIAGNOSIS — M79641 Pain in right hand: Secondary | ICD-10-CM

## 2023-06-04 NOTE — Progress Notes (Signed)
 My hands hurt  He has more hand pain with the cold weather.  It bothers him in the mornings.  After a while, he feels better.  He has no trauma, no weakness, no redness.  Both hands are tender at the PIP joints, more on the middle fingers with some swelling.  ROM is good.  NV intact.  Encounter Diagnosis  Name Primary?   Bilateral hand pain Yes   Continue his medicine and his rubs.  Return in three months.  Call if any problem.  Precautions discussed.  Electronically Signed Pleasant Brilliant, MD 1/15/20251:42 PM

## 2023-06-18 ENCOUNTER — Other Ambulatory Visit: Payer: Self-pay | Admitting: Orthopaedic Surgery

## 2023-06-18 MED ORDER — HYDROCODONE-ACETAMINOPHEN 7.5-325 MG PO TABS: 1.0000 | ORAL_TABLET | Freq: Four times a day (QID) | ORAL | 0 refills | Status: DC | PRN

## 2023-07-02 ENCOUNTER — Telehealth: Payer: Self-pay | Admitting: Orthopaedic Surgery

## 2023-07-03 MED ORDER — HYDROCODONE-ACETAMINOPHEN 7.5-325 MG PO TABS: 1.0000 | ORAL_TABLET | Freq: Four times a day (QID) | ORAL | 0 refills | Status: DC | PRN

## 2023-07-16 ENCOUNTER — Telehealth: Payer: Self-pay | Admitting: Orthopaedic Surgery

## 2023-07-16 MED ORDER — HYDROCODONE-ACETAMINOPHEN 7.5-325 MG PO TABS: 1.0000 | ORAL_TABLET | Freq: Four times a day (QID) | ORAL | 0 refills | Status: DC | PRN

## 2023-07-30 ENCOUNTER — Telehealth: Payer: Self-pay | Admitting: Orthopaedic Surgery

## 2023-07-30 MED ORDER — HYDROCODONE-ACETAMINOPHEN 7.5-325 MG PO TABS
1.0000 | ORAL_TABLET | Freq: Four times a day (QID) | ORAL | 0 refills | Status: DC | PRN
Start: 2023-07-30 — End: 2023-08-13

## 2023-08-13 ENCOUNTER — Telehealth: Payer: Self-pay | Admitting: Orthopaedic Surgery

## 2023-08-13 MED ORDER — HYDROCODONE-ACETAMINOPHEN 7.5-325 MG PO TABS
1.0000 | ORAL_TABLET | Freq: Four times a day (QID) | ORAL | 0 refills | Status: DC | PRN
Start: 2023-08-13 — End: 2023-08-27

## 2023-08-18 DIAGNOSIS — I10 Essential (primary) hypertension: Secondary | ICD-10-CM | POA: Diagnosis not present

## 2023-08-18 DIAGNOSIS — Z6836 Body mass index (BMI) 36.0-36.9, adult: Secondary | ICD-10-CM | POA: Diagnosis not present

## 2023-08-18 DIAGNOSIS — F5101 Primary insomnia: Secondary | ICD-10-CM | POA: Diagnosis not present

## 2023-08-18 DIAGNOSIS — E782 Mixed hyperlipidemia: Secondary | ICD-10-CM | POA: Diagnosis not present

## 2023-08-18 DIAGNOSIS — M5459 Other low back pain: Secondary | ICD-10-CM | POA: Diagnosis not present

## 2023-08-18 DIAGNOSIS — F411 Generalized anxiety disorder: Secondary | ICD-10-CM | POA: Diagnosis not present

## 2023-08-18 DIAGNOSIS — J449 Chronic obstructive pulmonary disease, unspecified: Secondary | ICD-10-CM | POA: Diagnosis not present

## 2023-08-18 DIAGNOSIS — Z0000 Encounter for general adult medical examination without abnormal findings: Secondary | ICD-10-CM | POA: Diagnosis not present

## 2023-08-27 ENCOUNTER — Telehealth: Payer: Self-pay | Admitting: Orthopaedic Surgery

## 2023-08-27 MED ORDER — HYDROCODONE-ACETAMINOPHEN 7.5-325 MG PO TABS
1.0000 | ORAL_TABLET | Freq: Four times a day (QID) | ORAL | 0 refills | Status: DC | PRN
Start: 2023-08-27 — End: 2023-09-10

## 2023-09-03 ENCOUNTER — Ambulatory Visit: Payer: Medicare HMO | Admitting: Orthopaedic Surgery

## 2023-09-03 ENCOUNTER — Encounter: Payer: Self-pay | Admitting: Orthopaedic Surgery

## 2023-09-03 VITALS — BP 159/96 | HR 82 | Ht 68.5 in | Wt 234.0 lb

## 2023-09-03 DIAGNOSIS — M79642 Pain in left hand: Secondary | ICD-10-CM

## 2023-09-03 DIAGNOSIS — M79641 Pain in right hand: Secondary | ICD-10-CM | POA: Diagnosis not present

## 2023-09-03 NOTE — Progress Notes (Signed)
 My hands are feeling better.  He has had less pain in the hands as the weather has turned warmer.  Cold days still make then feel worse and he has more swelling then.  He has no new trauma.  ROM of the fingers is good.  He has slight swelling and slight PIP tenderness.  NV intact.  Encounter Diagnosis  Name Primary?   Bilateral hand pain Yes   Return in three months.  Continue his medicine.  Call if any problem.  Precautions discussed.  Electronically Signed Pleasant Brilliant, MD 4/16/20252:07 PM

## 2023-09-10 ENCOUNTER — Telehealth: Payer: Self-pay | Admitting: Orthopaedic Surgery

## 2023-09-10 MED ORDER — HYDROCODONE-ACETAMINOPHEN 7.5-325 MG PO TABS: 1.0000 | ORAL_TABLET | Freq: Four times a day (QID) | ORAL | 0 refills | Status: DC | PRN

## 2023-09-11 DIAGNOSIS — J449 Chronic obstructive pulmonary disease, unspecified: Secondary | ICD-10-CM | POA: Diagnosis not present

## 2023-09-11 DIAGNOSIS — E669 Obesity, unspecified: Secondary | ICD-10-CM | POA: Diagnosis not present

## 2023-09-11 DIAGNOSIS — Z87891 Personal history of nicotine dependence: Secondary | ICD-10-CM | POA: Diagnosis not present

## 2023-09-11 DIAGNOSIS — R0609 Other forms of dyspnea: Secondary | ICD-10-CM | POA: Diagnosis not present

## 2023-09-11 DIAGNOSIS — J410 Simple chronic bronchitis: Secondary | ICD-10-CM | POA: Diagnosis not present

## 2023-09-24 ENCOUNTER — Telehealth: Payer: Self-pay | Admitting: Orthopaedic Surgery

## 2023-09-24 DIAGNOSIS — I10 Essential (primary) hypertension: Secondary | ICD-10-CM | POA: Diagnosis not present

## 2023-09-24 DIAGNOSIS — E782 Mixed hyperlipidemia: Secondary | ICD-10-CM | POA: Diagnosis not present

## 2023-09-24 MED ORDER — HYDROCODONE-ACETAMINOPHEN 7.5-325 MG PO TABS: 1.0000 | ORAL_TABLET | Freq: Four times a day (QID) | ORAL | 0 refills | Status: DC | PRN

## 2023-10-08 ENCOUNTER — Telehealth: Payer: Self-pay | Admitting: Orthopaedic Surgery

## 2023-10-08 NOTE — Telephone Encounter (Signed)
 Dr. Vicente Graham pt - pt lvm stating he would like to speak to Dr. Vicente Graham nurse.  [phone]

## 2023-10-08 NOTE — Telephone Encounter (Signed)
Called pt, lvm to return call

## 2023-10-22 ENCOUNTER — Telehealth: Payer: Self-pay | Admitting: Orthopaedic Surgery

## 2023-10-22 ENCOUNTER — Other Ambulatory Visit: Payer: Self-pay | Admitting: Orthopaedic Surgery

## 2023-10-22 NOTE — Telephone Encounter (Signed)
 Dr. Vicente Graham pt - pt lvm requesting a refill for Hydrocodone  7.5-325 to be sent to [facility]

## 2023-10-28 DIAGNOSIS — I10 Essential (primary) hypertension: Secondary | ICD-10-CM | POA: Diagnosis not present

## 2023-10-28 DIAGNOSIS — E782 Mixed hyperlipidemia: Secondary | ICD-10-CM | POA: Diagnosis not present

## 2023-11-04 ENCOUNTER — Telehealth: Payer: Self-pay | Admitting: Orthopaedic Surgery

## 2023-11-17 DIAGNOSIS — F5101 Primary insomnia: Secondary | ICD-10-CM | POA: Diagnosis not present

## 2023-11-17 DIAGNOSIS — Z0000 Encounter for general adult medical examination without abnormal findings: Secondary | ICD-10-CM | POA: Diagnosis not present

## 2023-11-17 DIAGNOSIS — M5459 Other low back pain: Secondary | ICD-10-CM | POA: Diagnosis not present

## 2023-11-17 DIAGNOSIS — J449 Chronic obstructive pulmonary disease, unspecified: Secondary | ICD-10-CM | POA: Diagnosis not present

## 2023-11-17 DIAGNOSIS — I10 Essential (primary) hypertension: Secondary | ICD-10-CM | POA: Diagnosis not present

## 2023-11-17 DIAGNOSIS — F411 Generalized anxiety disorder: Secondary | ICD-10-CM | POA: Diagnosis not present

## 2023-11-17 DIAGNOSIS — E782 Mixed hyperlipidemia: Secondary | ICD-10-CM | POA: Diagnosis not present

## 2023-11-17 DIAGNOSIS — Z6837 Body mass index (BMI) 37.0-37.9, adult: Secondary | ICD-10-CM | POA: Diagnosis not present

## 2023-11-17 DIAGNOSIS — R7303 Prediabetes: Secondary | ICD-10-CM | POA: Diagnosis not present

## 2023-11-20 ENCOUNTER — Telehealth: Payer: Self-pay | Admitting: Orthopaedic Surgery

## 2023-11-20 NOTE — Telephone Encounter (Signed)
 DR. BRENNA   Patient called requested refill on his pain medicine   HYDROcodone -acetaminophen  (NORCO) 7.5-325 MG tablet     [facility]

## 2023-11-22 MED ORDER — HYDROCODONE-ACETAMINOPHEN 7.5-325 MG PO TABS: 1.0000 | ORAL_TABLET | Freq: Four times a day (QID) | ORAL | 0 refills | Status: DC | PRN

## 2023-11-26 ENCOUNTER — Telehealth: Payer: Self-pay | Admitting: Orthopaedic Surgery

## 2023-11-26 NOTE — Telephone Encounter (Signed)
 Spoke w/ pt and let him know rx was sent on 11/22/23 to requested pharmacy. System shows pharmacy received receipt.

## 2023-11-26 NOTE — Telephone Encounter (Signed)
 Dr. Janae pt - pt lvm requesting a refill for Hydrocodone  7.5-325 to be sent to [facility] in [place]

## 2023-11-28 DIAGNOSIS — E782 Mixed hyperlipidemia: Secondary | ICD-10-CM | POA: Diagnosis not present

## 2023-11-28 DIAGNOSIS — I10 Essential (primary) hypertension: Secondary | ICD-10-CM | POA: Diagnosis not present

## 2023-12-03 ENCOUNTER — Ambulatory Visit: Admitting: Orthopaedic Surgery

## 2023-12-03 ENCOUNTER — Encounter: Payer: Self-pay | Admitting: Orthopaedic Surgery

## 2023-12-03 VITALS — Ht 70.0 in | Wt 227.0 lb

## 2023-12-03 DIAGNOSIS — M79642 Pain in left hand: Secondary | ICD-10-CM | POA: Diagnosis not present

## 2023-12-03 DIAGNOSIS — M79641 Pain in right hand: Secondary | ICD-10-CM

## 2023-12-03 MED ORDER — HYDROCODONE-ACETAMINOPHEN 7.5-325 MG PO TABS: 1.0000 | ORAL_TABLET | Freq: Four times a day (QID) | ORAL | 0 refills | Status: DC | PRN

## 2023-12-03 NOTE — Progress Notes (Signed)
 My hands hurt  He has more early morning hand pain over the MCP joints.  He puts them in warm water and it helps.  He has no trauma.  Right hand has tenderness of the MCP joints index thru little but no redness, no swelling.  ROM is full.  NV intact.  Encounter Diagnosis  Name Primary?   Bilateral hand pain Yes   I have reviewed the [place]  Controlled Substance Reporting System web site prior to prescribing narcotic medicine for this patient.  Return in three months.  Call if any problem.  Precautions discussed.  Electronically Signed Lemond Stable, MD 7/16/20251:51 PM

## 2023-12-17 ENCOUNTER — Telehealth: Payer: Self-pay | Admitting: Orthopaedic Surgery

## 2023-12-31 ENCOUNTER — Telehealth: Payer: Self-pay | Admitting: Orthopaedic Surgery

## 2024-01-09 ENCOUNTER — Encounter: Payer: Self-pay | Admitting: Radiology

## 2024-01-16 ENCOUNTER — Telehealth: Payer: Self-pay | Admitting: Orthopaedic Surgery

## 2024-01-16 DIAGNOSIS — E782 Mixed hyperlipidemia: Secondary | ICD-10-CM | POA: Diagnosis not present

## 2024-01-16 DIAGNOSIS — I10 Essential (primary) hypertension: Secondary | ICD-10-CM | POA: Diagnosis not present

## 2024-01-20 ENCOUNTER — Telehealth: Payer: Self-pay | Admitting: Orthopaedic Surgery

## 2024-01-20 NOTE — Telephone Encounter (Signed)
 Dr. Janae pt - pt lvm stating he wants to know why Dr. Brenna didn't send his meds in.  He is requesting a refill for Hydrocodone  7.5-325 to be sent to [facility]

## 2024-01-21 NOTE — Telephone Encounter (Signed)
 I called pt and informed him the meds were sent in yesterday to his pharmacy

## 2024-02-04 ENCOUNTER — Telehealth: Payer: Self-pay | Admitting: Orthopaedic Surgery

## 2024-02-16 DIAGNOSIS — E782 Mixed hyperlipidemia: Secondary | ICD-10-CM | POA: Diagnosis not present

## 2024-02-16 DIAGNOSIS — F5101 Primary insomnia: Secondary | ICD-10-CM | POA: Diagnosis not present

## 2024-02-16 DIAGNOSIS — I10 Essential (primary) hypertension: Secondary | ICD-10-CM | POA: Diagnosis not present

## 2024-02-16 DIAGNOSIS — E66812 Obesity, class 2: Secondary | ICD-10-CM | POA: Diagnosis not present

## 2024-02-16 DIAGNOSIS — Z6836 Body mass index (BMI) 36.0-36.9, adult: Secondary | ICD-10-CM | POA: Diagnosis not present

## 2024-02-16 DIAGNOSIS — F411 Generalized anxiety disorder: Secondary | ICD-10-CM | POA: Diagnosis not present

## 2024-02-16 DIAGNOSIS — J449 Chronic obstructive pulmonary disease, unspecified: Secondary | ICD-10-CM | POA: Diagnosis not present

## 2024-02-16 DIAGNOSIS — M5459 Other low back pain: Secondary | ICD-10-CM | POA: Diagnosis not present

## 2024-02-16 DIAGNOSIS — N182 Chronic kidney disease, stage 2 (mild): Secondary | ICD-10-CM | POA: Diagnosis not present

## 2024-02-17 DIAGNOSIS — E782 Mixed hyperlipidemia: Secondary | ICD-10-CM | POA: Diagnosis not present

## 2024-02-17 DIAGNOSIS — I10 Essential (primary) hypertension: Secondary | ICD-10-CM | POA: Diagnosis not present

## 2024-02-18 ENCOUNTER — Ambulatory Visit: Admitting: Orthopaedic Surgery

## 2024-02-18 ENCOUNTER — Encounter: Payer: Self-pay | Admitting: Orthopaedic Surgery

## 2024-02-18 DIAGNOSIS — M79641 Pain in right hand: Secondary | ICD-10-CM | POA: Diagnosis not present

## 2024-02-18 DIAGNOSIS — M79642 Pain in left hand: Secondary | ICD-10-CM | POA: Diagnosis not present

## 2024-02-18 MED ORDER — HYDROCODONE-ACETAMINOPHEN 7.5-325 MG PO TABS: 1.0000 | ORAL_TABLET | Freq: Four times a day (QID) | ORAL | 0 refills | Status: DC | PRN

## 2024-02-18 NOTE — Progress Notes (Signed)
 My hands are not that sore today.  He has bilateral hand pain and more pain at the right dominant hand PIP joint on the long finger.  He has DIP joint deformities.  He has no redness, no numbness.  The pain is worse with cold weather.  PIP joint of the right long finger is tender and has some fusiform swelling of the finger.  NV intact. ROM is decreased.  He has DIP joint changes of index finger on the right and other fingers.    Encounter Diagnosis  Name Primary?   Bilateral hand pain Yes   I have reviewed the [place]  Controlled Substance Reporting System web site prior to prescribing narcotic medicine for this patient.  I have informed the patient I will be retiring from medical practice and from this office on February 19, 2024.  The patient has been offered continuing care with Dr. Margrette or Dr. Onesimo of this office.  The patient may choose another provider and the records will be forwarded after proper signature and notification.  Patient understands and agrees.  Call if any problem.  Precautions discussed.  Electronically Signed Lemond Stable, MD 10/1/20251:43 PM

## 2024-03-03 ENCOUNTER — Ambulatory Visit: Admitting: Orthopaedic Surgery

## 2024-03-09 ENCOUNTER — Encounter: Payer: Self-pay | Admitting: Orthopedic Surgery

## 2024-03-09 ENCOUNTER — Other Ambulatory Visit (INDEPENDENT_AMBULATORY_CARE_PROVIDER_SITE_OTHER): Payer: Self-pay

## 2024-03-09 ENCOUNTER — Ambulatory Visit: Admitting: Orthopedic Surgery

## 2024-03-09 VITALS — BP 150/70 | HR 69

## 2024-03-09 DIAGNOSIS — M79641 Pain in right hand: Secondary | ICD-10-CM | POA: Diagnosis not present

## 2024-03-09 DIAGNOSIS — M79642 Pain in left hand: Secondary | ICD-10-CM

## 2024-03-09 DIAGNOSIS — M25511 Pain in right shoulder: Secondary | ICD-10-CM

## 2024-03-09 DIAGNOSIS — M19011 Primary osteoarthritis, right shoulder: Secondary | ICD-10-CM | POA: Diagnosis not present

## 2024-03-09 MED ORDER — HYDROCODONE-ACETAMINOPHEN 7.5-325 MG PO TABS: 1.0000 | ORAL_TABLET | Freq: Four times a day (QID) | ORAL | 0 refills | Status: DC | PRN

## 2024-03-09 NOTE — Progress Notes (Signed)
 New Patient Visit  Assessment: John Bridges. is a 68 y.o. male with the following: 1. Bilateral hand pain 2.  Right shoulder glenohumeral joint arthritis  Plan: Daril LITTIE [name] has chronic pain in both hands.  He has been taking narcotics for years.  He does have advanced degenerative changes, with evidence of chronic rotator cuff tear in the right shoulder.  He states his right shoulder is currently not bothering him.  We briefly discussed surgery.  He has no interest in surgery.  The right shoulder is not that painful today.  He has pretty good motion.  He does not feel as though he needs further treatment for his right shoulder.  In regards to bilateral hand pain, he would like to continue taking pain medicines.  I have advised him that I would not continue to provide these medicines for him, and he either needs to find another physician, or we will place a referral to a pain management provider.  He has a physician at home who he will discuss about ongoing narcotic management.  If he needs anything else, or is interested in further treatment for his right shoulder, he can contact the clinic for another appointment.  Follow-up: Return if symptoms worsen or fail to improve.  Subjective:  Chief Complaint  Patient presents with   Shoulder Pain    R for yrs   Knee Pain    R swelling    Hand Pain    R for yrs    History of Present Illness: John Bridges. is a 68 y.o. male who presents for evaluation of left foot complaints.  He has been followed by Dr. Brenna for years.  He has pain in the right shoulder, as well as bilateral hands.  He has been taking hydrocodone  for many years.  He reports that he injured his right shoulder couple times.  He has had a couple of dislocations.  Currently, his pain is not that bad.  He is not interested in an injection.  He continues to have pain in both hands.  Currently, the right long finger is the worst.  He states that the pain extends from the  PIP joint to into the third metatarsal of the right hand.   Review of Systems: No fevers or chills No numbness or tingling No chest pain No shortness of breath No bowel or bladder dysfunction No GI distress No headaches   Medical History:  Past Medical History:  Diagnosis Date   Arthritis    COPD (chronic obstructive pulmonary disease) (HCC)    Depression    Essential hypertension    H/O gastroesophageal reflux (GERD)    History of bronchitis    Hyperlipidemia    Lung nodule    Right upper lobe lung nodule 1.3 cm    Shortness of breath dyspnea     Past Surgical History:  Procedure Laterality Date   CARPAL TUNNEL RELEASE Right 12/17/2018   Procedure: CARPAL TUNNEL RELEASE;  Surgeon: Margrette Taft BRAVO, MD;  Location: [facility];  Service: Orthopedics;  Laterality: Right;   COLONOSCOPY     KNEE ARTHROSCOPY WITH MEDIAL MENISECTOMY Right 04/03/2017   Procedure: KNEE ARTHROSCOPY WITH MEDIAL MENISECTOMY;  Surgeon: Margrette Taft BRAVO, MD;  Location: [facility];  Service: Orthopedics;  Laterality: Right;   LEG SURGERY Left    plate inserted and removed.   THORACOTOMY Right 09/25/2015   Procedure: MINI/LIMITED THORACOTOMY WITH RIGHT UPPER LOBE WEDGE RESECTION;  Surgeon: Dallas KATHEE Jude, MD;  Location: [facility];  Service: Thoracic;  Laterality: Right;   TOOTH EXTRACTION     VIDEO BRONCHOSCOPY N/A 09/25/2015   Procedure: VIDEO BRONCHOSCOPY;  Surgeon: Dallas KATHEE Jude, MD;  Location: [facility];  Service: Thoracic;  Laterality: N/A;    Family History  Problem Relation Age of Onset   Hypertension Mother    Hypertension Father    Cancer Brother    CAD Brother    Stroke Brother    Social History   Tobacco Use   Smoking status: Former    Current packs/day: 0.00    Average packs/day: 1 pack/day for 40.0 years (40.0 ttl pk-yrs)    Types: Cigarettes    Start date: 08/16/1975    Quit date: 08/16/2015    Years since quitting: 8.5   Smokeless tobacco: Former  Building services engineer status:  Never Used  Substance Use Topics   Alcohol use: Yes    Alcohol/week: 0.0 standard drinks of alcohol    Comment: 3-4 times a week   Drug use: No    No Known Allergies  Current Meds  Medication Sig   albuterol  (PROVENTIL  HFA;VENTOLIN  HFA) 108 (90 Base) MCG/ACT inhaler Inhale 2 puffs every 6 (six) hours as needed into the lungs for wheezing or shortness of breath.   ALPRAZolam (XANAX) 0.5 MG tablet Take 0.5 mg at bedtime as needed by mouth for anxiety or sleep.    amitriptyline (ELAVIL) 10 MG tablet Take 10 mg by mouth at bedtime.   amLODipine  (NORVASC ) 10 MG tablet Take 10 mg by mouth daily.   atorvastatin (LIPITOR) 40 MG tablet Take 40 mg by mouth every morning.    benazepril (LOTENSIN) 40 MG tablet Take 40 mg by mouth daily.   gabapentin (NEURONTIN) 100 MG capsule Take 100 mg by mouth 3 (three) times daily.   hydrochlorothiazide (HYDRODIURIL) 25 MG tablet Take 25 mg by mouth daily.   HYDROcodone -acetaminophen  (NORCO) 7.5-325 MG tablet Take 1 tablet by mouth every 6 (six) hours as needed for moderate pain (pain score 4-6).   ipratropium-albuterol  (DUONEB) 0.5-2.5 (3) MG/3ML SOLN    olmesartan (BENICAR) 40 MG tablet Take 40 mg by mouth daily.   Omega-3 Fatty Acids (FISH OIL PO) Take 1 capsule daily by mouth.   ondansetron  (ZOFRAN ) 4 MG tablet Take 1 tablet (4 mg total) by mouth as needed for nausea or vomiting.   pantoprazole  (PROTONIX ) 40 MG tablet Take 1 tablet (40 mg total) by mouth daily.   Potassium 99 MG TABS Take 99 mg by mouth every morning.   TRELEGY ELLIPTA 100-62.5-25 MCG/ACT AEPB Inhale 1 puff into the lungs daily.   triamcinolone cream (KENALOG) 0.1 % Apply 1 application daily as needed topically (for rash on feet).    umeclidinium-vilanterol (ANORO ELLIPTA) 62.5-25 MCG/INH AEPB Inhale 1 puff into the lungs daily.    Objective: BP (!) 150/70   Pulse 69   Physical Exam:  General: Alert and oriented. and No acute distress. Gait: Normal gait.  Bilateral hands with  swelling and deformities to multiple joints.  There is no swelling or redness to the right long finger.  He has full range of motion.  Good grip strength.  130 degrees of forward flexion of right shoulder.  Sensation intact in the axillary nerve distribution.  IMAGING: I personally ordered and reviewed the following images   X-rays of the right shoulder were obtained in clinic today.  No acute injuries are noted.  Evidence of chronic rotator cuff injury, with early acetabular  rotation of the acromion.  There is decreased joint space within the glenohumeral joint, with associated osteophytes.  Cysts are noted within the humeral head.  Impression: Severe right glenohumeral joint arthritis, with evidence of chronic rotator cuff injury.  X-rays of the right hand were obtained in clinic today.  No acute injuries are noted. Diffuse loss of joint space, without obvious signs of advanced arthritis in any of the joints.  No dislocation.  No bony lesions.  Impression: Right hand x-ray with diffuse degenerative changes in the fingers.   New Medications:  Meds ordered this encounter  Medications   HYDROcodone -acetaminophen  (NORCO) 7.5-325 MG tablet    Sig: Take 1 tablet by mouth every 6 (six) hours as needed for moderate pain (pain score 4-6).    Dispense:  56 tablet    Refill:  0      Oneil DELENA Horde, MD  03/09/2024 10:19 AM

## 2024-03-10 NOTE — Addendum Note (Signed)
 Addended by: VICENTA EMMIE HERO on: 03/10/2024 04:05 PM   Modules accepted: Orders

## 2024-03-15 DIAGNOSIS — E669 Obesity, unspecified: Secondary | ICD-10-CM | POA: Diagnosis not present

## 2024-03-15 DIAGNOSIS — R0609 Other forms of dyspnea: Secondary | ICD-10-CM | POA: Diagnosis not present

## 2024-03-15 DIAGNOSIS — Z87891 Personal history of nicotine dependence: Secondary | ICD-10-CM | POA: Diagnosis not present

## 2024-03-15 DIAGNOSIS — J410 Simple chronic bronchitis: Secondary | ICD-10-CM | POA: Diagnosis not present

## 2024-03-15 DIAGNOSIS — J449 Chronic obstructive pulmonary disease, unspecified: Secondary | ICD-10-CM | POA: Diagnosis not present

## 2024-03-22 ENCOUNTER — Encounter: Payer: Self-pay | Admitting: Radiology

## 2024-03-24 ENCOUNTER — Other Ambulatory Visit: Payer: Self-pay | Admitting: Orthopedic Surgery

## 2024-03-25 ENCOUNTER — Telehealth: Payer: Self-pay

## 2024-03-25 NOTE — Telephone Encounter (Signed)
 Spoke w/ pt and let him know referral was sent to facility requested. Asked pt to give that clinic a call, pt states he has but he didn't get a call or response to VM. Let him know he should try again and to let us  know if he would like to try somewhere different. Verbalized understanding.

## 2024-03-25 NOTE — Telephone Encounter (Signed)
 Patient called and asked to have you call him in regards to being referred to another doctor. He hasn't heard anything from anyone.  [phone]

## 2024-04-01 DIAGNOSIS — M130 Polyarthritis, unspecified: Secondary | ICD-10-CM | POA: Diagnosis not present

## 2024-04-01 DIAGNOSIS — M79642 Pain in left hand: Secondary | ICD-10-CM | POA: Diagnosis not present

## 2024-04-01 DIAGNOSIS — M159 Polyosteoarthritis, unspecified: Secondary | ICD-10-CM | POA: Diagnosis not present

## 2024-04-01 DIAGNOSIS — Z79899 Other long term (current) drug therapy: Secondary | ICD-10-CM | POA: Diagnosis not present

## 2024-04-01 DIAGNOSIS — M7551 Bursitis of right shoulder: Secondary | ICD-10-CM | POA: Diagnosis not present

## 2024-04-01 DIAGNOSIS — E559 Vitamin D deficiency, unspecified: Secondary | ICD-10-CM | POA: Diagnosis not present

## 2024-04-01 DIAGNOSIS — M79641 Pain in right hand: Secondary | ICD-10-CM | POA: Diagnosis not present

## 2024-04-02 DIAGNOSIS — E782 Mixed hyperlipidemia: Secondary | ICD-10-CM | POA: Diagnosis not present

## 2024-04-02 DIAGNOSIS — I10 Essential (primary) hypertension: Secondary | ICD-10-CM | POA: Diagnosis not present

## 2024-04-07 DIAGNOSIS — M159 Polyosteoarthritis, unspecified: Secondary | ICD-10-CM | POA: Diagnosis not present

## 2024-04-07 DIAGNOSIS — M1711 Unilateral primary osteoarthritis, right knee: Secondary | ICD-10-CM | POA: Diagnosis not present

## 2024-04-07 DIAGNOSIS — M25561 Pain in right knee: Secondary | ICD-10-CM | POA: Diagnosis not present

## 2024-04-07 DIAGNOSIS — L570 Actinic keratosis: Secondary | ICD-10-CM | POA: Diagnosis not present

## 2024-04-07 DIAGNOSIS — M79672 Pain in left foot: Secondary | ICD-10-CM | POA: Diagnosis not present

## 2024-04-07 DIAGNOSIS — M79671 Pain in right foot: Secondary | ICD-10-CM | POA: Diagnosis not present

## 2024-05-06 ENCOUNTER — Encounter (HOSPITAL_COMMUNITY): Payer: Self-pay | Admitting: Emergency Medicine

## 2024-05-06 ENCOUNTER — Emergency Department (HOSPITAL_COMMUNITY)

## 2024-05-06 ENCOUNTER — Other Ambulatory Visit: Payer: Self-pay

## 2024-05-06 ENCOUNTER — Inpatient Hospital Stay (HOSPITAL_COMMUNITY)
Admission: EM | Admit: 2024-05-06 | Discharge: 2024-05-09 | DRG: 872 | Disposition: A | Discharge status: Home / Self Care | Attending: Family Medicine | Admitting: Family Medicine

## 2024-05-06 DIAGNOSIS — E872 Acidosis, unspecified: Secondary | ICD-10-CM | POA: Diagnosis present

## 2024-05-06 DIAGNOSIS — E869 Volume depletion, unspecified: Secondary | ICD-10-CM | POA: Diagnosis present

## 2024-05-06 DIAGNOSIS — M1380 Other specified arthritis, unspecified site: Secondary | ICD-10-CM | POA: Diagnosis present

## 2024-05-06 DIAGNOSIS — A419 Sepsis, unspecified organism: Principal | ICD-10-CM | POA: Diagnosis present

## 2024-05-06 DIAGNOSIS — N179 Acute kidney failure, unspecified: Secondary | ICD-10-CM | POA: Diagnosis present

## 2024-05-06 DIAGNOSIS — E785 Hyperlipidemia, unspecified: Secondary | ICD-10-CM | POA: Diagnosis present

## 2024-05-06 DIAGNOSIS — Z79899 Other long term (current) drug therapy: Secondary | ICD-10-CM | POA: Diagnosis not present

## 2024-05-06 DIAGNOSIS — J449 Chronic obstructive pulmonary disease, unspecified: Secondary | ICD-10-CM | POA: Diagnosis present

## 2024-05-06 DIAGNOSIS — Z8249 Family history of ischemic heart disease and other diseases of the circulatory system: Secondary | ICD-10-CM | POA: Diagnosis not present

## 2024-05-06 DIAGNOSIS — I10 Essential (primary) hypertension: Secondary | ICD-10-CM | POA: Diagnosis present

## 2024-05-06 DIAGNOSIS — A411 Sepsis due to other specified staphylococcus: Secondary | ICD-10-CM | POA: Diagnosis present

## 2024-05-06 DIAGNOSIS — E871 Hypo-osmolality and hyponatremia: Secondary | ICD-10-CM | POA: Diagnosis present

## 2024-05-06 DIAGNOSIS — Z87891 Personal history of nicotine dependence: Secondary | ICD-10-CM

## 2024-05-06 DIAGNOSIS — Z823 Family history of stroke: Secondary | ICD-10-CM | POA: Diagnosis not present

## 2024-05-06 DIAGNOSIS — Z1152 Encounter for screening for COVID-19: Secondary | ICD-10-CM | POA: Diagnosis not present

## 2024-05-06 DIAGNOSIS — I959 Hypotension, unspecified: Secondary | ICD-10-CM | POA: Diagnosis present

## 2024-05-06 DIAGNOSIS — J439 Emphysema, unspecified: Secondary | ICD-10-CM | POA: Diagnosis not present

## 2024-05-06 DIAGNOSIS — H539 Unspecified visual disturbance: Secondary | ICD-10-CM

## 2024-05-06 DIAGNOSIS — H538 Other visual disturbances: Secondary | ICD-10-CM | POA: Diagnosis present

## 2024-05-06 DIAGNOSIS — K219 Gastro-esophageal reflux disease without esophagitis: Secondary | ICD-10-CM | POA: Diagnosis present

## 2024-05-06 DIAGNOSIS — Z7951 Long term (current) use of inhaled steroids: Secondary | ICD-10-CM | POA: Diagnosis not present

## 2024-05-06 DIAGNOSIS — R6883 Chills (without fever): Secondary | ICD-10-CM | POA: Diagnosis present

## 2024-05-06 LAB — COMPREHENSIVE METABOLIC PANEL WITH GFR
ALT: 23 U/L (ref 0–44)
AST: 35 U/L (ref 15–41)
Albumin: 3.9 g/dL (ref 3.5–5.0)
Alkaline Phosphatase: 65 U/L (ref 38–126)
Anion gap: 18 — ABNORMAL HIGH (ref 5–15)
BUN: 48 mg/dL — ABNORMAL HIGH (ref 8–23)
CO2: 23 mmol/L (ref 22–32)
Calcium: 8.8 mg/dL — ABNORMAL LOW (ref 8.9–10.3)
Chloride: 93 mmol/L — ABNORMAL LOW (ref 98–111)
Creatinine, Ser: 2.22 mg/dL — ABNORMAL HIGH (ref 0.61–1.24)
GFR, Estimated: 32 mL/min — ABNORMAL LOW (ref >60)
Glucose, Bld: 108 mg/dL — ABNORMAL HIGH (ref 70–99)
Potassium: 3.8 mmol/L (ref 3.5–5.1)
Sodium: 134 mmol/L — ABNORMAL LOW (ref 135–145)
Total Bilirubin: 0.7 mg/dL (ref 0.0–1.2)
Total Protein: 7.1 g/dL (ref 6.5–8.1)

## 2024-05-06 LAB — RESP PANEL BY RT-PCR (RSV, FLU A&B, COVID)  RVPGX2
Influenza A by PCR: NEGATIVE
Influenza B by PCR: NEGATIVE
Resp Syncytial Virus by PCR: NEGATIVE
SARS Coronavirus 2 by RT PCR: NEGATIVE

## 2024-05-06 LAB — URINALYSIS, W/ REFLEX TO CULTURE (INFECTION SUSPECTED)
Bacteria, UA: NONE SEEN
Bilirubin Urine: NEGATIVE
Glucose, UA: NEGATIVE mg/dL
Hgb urine dipstick: NEGATIVE
Ketones, ur: NEGATIVE mg/dL
Leukocytes,Ua: NEGATIVE
Nitrite: NEGATIVE
Protein, ur: 30 mg/dL — AB
Specific Gravity, Urine: 1.016 (ref 1.005–1.030)
pH: 5 (ref 5.0–8.0)

## 2024-05-06 LAB — CBC WITH DIFFERENTIAL/PLATELET
Abs Immature Granulocytes: 0.05 K/uL (ref 0.00–0.07)
Basophils Absolute: 0 K/uL (ref 0.0–0.1)
Basophils Relative: 0 %
Eosinophils Absolute: 1 K/uL — ABNORMAL HIGH (ref 0.0–0.5)
Eosinophils Relative: 14 %
HCT: 44.3 % (ref 39.0–52.0)
Hemoglobin: 14.9 g/dL (ref 13.0–17.0)
Immature Granulocytes: 1 %
Lymphocytes Relative: 6 %
Lymphs Abs: 0.4 K/uL — ABNORMAL LOW (ref 0.7–4.0)
MCH: 31.7 pg (ref 26.0–34.0)
MCHC: 33.6 g/dL (ref 30.0–36.0)
MCV: 94.3 fL (ref 80.0–100.0)
Monocytes Absolute: 0.6 K/uL (ref 0.1–1.0)
Monocytes Relative: 8 %
Neutro Abs: 5.3 K/uL (ref 1.7–7.7)
Neutrophils Relative %: 71 %
Platelets: 133 K/uL — ABNORMAL LOW (ref 150–400)
RBC: 4.7 MIL/uL (ref 4.22–5.81)
RDW: 13.5 % (ref 11.5–15.5)
WBC: 7.4 K/uL (ref 4.0–10.5)
nRBC: 0 % (ref 0.0–0.2)

## 2024-05-06 LAB — LACTIC ACID, PLASMA
Lactic Acid, Venous: 2.3 mmol/L (ref 0.5–1.9)
Lactic Acid, Venous: 1.3 mmol/L (ref 0.5–1.9)

## 2024-05-06 LAB — PROTIME-INR
INR: 1.1 (ref 0.8–1.2)
Prothrombin Time: 14.8 s (ref 11.4–15.2)

## 2024-05-06 LAB — CBG MONITORING, ED: Glucose-Capillary: 120 mg/dL — ABNORMAL HIGH (ref 70–99)

## 2024-05-06 LAB — TROPONIN T, HIGH SENSITIVITY: Troponin T High Sensitivity: 48 ng/L — ABNORMAL HIGH (ref 0–19)

## 2024-05-06 MED ORDER — ALBUTEROL SULFATE (2.5 MG/3ML) 0.083% IN NEBU
2.5000 mg | INHALATION_SOLUTION | RESPIRATORY_TRACT | Status: DC | PRN
  Administered 2024-05-07: 2.5 mg via RESPIRATORY_TRACT
  Filled 2024-05-06: qty 3

## 2024-05-06 MED ORDER — ATORVASTATIN CALCIUM 40 MG PO TABS
40.0000 mg | ORAL_TABLET | ORAL | Status: DC
  Administered 2024-05-07 – 2024-05-09 (×3): 40 mg via ORAL
  Filled 2024-05-06 (×4): qty 1

## 2024-05-06 MED ORDER — SODIUM CHLORIDE 0.9 % IV SOLN: 2.0000 g | Freq: Once | INTRAVENOUS | Status: DC

## 2024-05-06 MED ORDER — HEPARIN SODIUM (PORCINE) 5000 UNIT/ML IJ SOLN
5000.0000 [IU] | Freq: Three times a day (TID) | INTRAMUSCULAR | Status: DC
  Administered 2024-05-06 – 2024-05-09 (×9): 5000 [IU] via SUBCUTANEOUS
  Filled 2024-05-06 (×9): qty 1

## 2024-05-06 MED ORDER — SODIUM CHLORIDE 0.9 % IV SOLN
2.0000 g | Freq: Two times a day (BID) | INTRAVENOUS | Status: DC
  Administered 2024-05-06 – 2024-05-07 (×2): 2 g via INTRAVENOUS
  Filled 2024-05-06 (×2): qty 12.5

## 2024-05-06 MED ORDER — IPRATROPIUM-ALBUTEROL 0.5-2.5 (3) MG/3ML IN SOLN
3.0000 mL | Freq: Once | RESPIRATORY_TRACT | Status: AC
  Administered 2024-05-06: 18:00:00 3 mL via RESPIRATORY_TRACT
  Filled 2024-05-06: qty 3

## 2024-05-06 MED ORDER — LACTATED RINGERS IV SOLN: 150.0000 mL/h | INTRAVENOUS | Status: DC

## 2024-05-06 MED ORDER — HYDROCORTISONE SOD SUC (PF) 100 MG IJ SOLR
100.0000 mg | Freq: Three times a day (TID) | INTRAMUSCULAR | Status: DC
  Administered 2024-05-06: 20:00:00 100 mg via INTRAVENOUS
  Filled 2024-05-06: qty 2

## 2024-05-06 MED ORDER — MIDODRINE HCL 5 MG PO TABS
10.0000 mg | ORAL_TABLET | Freq: Two times a day (BID) | ORAL | Status: DC
  Administered 2024-05-06 – 2024-05-07 (×2): 10 mg via ORAL
  Filled 2024-05-06 (×2): qty 2

## 2024-05-06 MED ORDER — VANCOMYCIN HCL 2000 MG/400ML IV SOLN
2000.0000 mg | Freq: Once | INTRAVENOUS | Status: AC
  Administered 2024-05-06: 20:00:00 2000 mg via INTRAVENOUS
  Filled 2024-05-06: qty 400

## 2024-05-06 MED ORDER — MIDODRINE HCL 5 MG PO TABS
5.0000 mg | ORAL_TABLET | Freq: Three times a day (TID) | ORAL | Status: DC
  Filled 2024-05-06: qty 1

## 2024-05-06 MED ORDER — SODIUM CHLORIDE 0.9 % IV SOLN
500.0000 mg | Freq: Once | INTRAVENOUS | Status: AC
  Administered 2024-05-06: 17:00:00 500 mg via INTRAVENOUS
  Filled 2024-05-06: qty 5

## 2024-05-06 MED ORDER — UMECLIDINIUM-VILANTEROL 62.5-25 MCG/ACT IN AEPB
1.0000 | INHALATION_SPRAY | Freq: Every day | RESPIRATORY_TRACT | Status: DC
  Administered 2024-05-07 – 2024-05-09 (×3): 1 via RESPIRATORY_TRACT
  Filled 2024-05-06 (×2): qty 14

## 2024-05-06 MED ORDER — VANCOMYCIN HCL IN DEXTROSE 1-5 GM/200ML-% IV SOLN: 1000.0000 mg | Freq: Once | INTRAVENOUS | Status: DC

## 2024-05-06 MED ORDER — ACETAMINOPHEN 650 MG RE SUPP: 650.0000 mg | Freq: Four times a day (QID) | RECTAL | Status: DC | PRN

## 2024-05-06 MED ORDER — LACTATED RINGERS IV SOLN: INTRAVENOUS | Status: DC

## 2024-05-06 MED ORDER — METRONIDAZOLE 500 MG/100ML IV SOLN
500.0000 mg | Freq: Two times a day (BID) | INTRAVENOUS | Status: DC
  Administered 2024-05-06 – 2024-05-08 (×4): 500 mg via INTRAVENOUS
  Filled 2024-05-06 (×4): qty 100

## 2024-05-06 MED ORDER — VANCOMYCIN VARIABLE DOSE PER UNSTABLE RENAL FUNCTION (PHARMACIST DOSING)
Status: DC
  Filled 2024-05-06: qty 1

## 2024-05-06 MED ORDER — LACTATED RINGERS IV BOLUS (SEPSIS)
1000.0000 mL | Freq: Once | INTRAVENOUS | Status: AC
  Administered 2024-05-06: 15:00:00 1000 mL via INTRAVENOUS

## 2024-05-06 MED ORDER — TETRACAINE HCL 0.5 % OP SOLN
2.0000 [drp] | Freq: Once | OPHTHALMIC | Status: AC
  Administered 2024-05-06: 21:00:00 2 [drp] via OPHTHALMIC
  Filled 2024-05-06: qty 4

## 2024-05-06 MED ORDER — LACTATED RINGERS IV BOLUS (SEPSIS)
1000.0000 mL | Freq: Once | INTRAVENOUS | Status: AC
  Administered 2024-05-06: 17:00:00 1000 mL via INTRAVENOUS

## 2024-05-06 MED ORDER — PANTOPRAZOLE SODIUM 40 MG PO TBEC
40.0000 mg | DELAYED_RELEASE_TABLET | Freq: Every day | ORAL | Status: DC
  Administered 2024-05-07 – 2024-05-09 (×3): 40 mg via ORAL
  Filled 2024-05-06 (×5): qty 1

## 2024-05-06 MED ORDER — ACETAMINOPHEN 325 MG PO TABS: 650.0000 mg | ORAL_TABLET | Freq: Four times a day (QID) | ORAL | Status: DC | PRN

## 2024-05-06 MED ORDER — SODIUM CHLORIDE 0.9 % IV SOLN
2.0000 g | Freq: Once | INTRAVENOUS | Status: AC
  Administered 2024-05-06: 15:00:00 2 g via INTRAVENOUS
  Filled 2024-05-06: qty 20

## 2024-05-06 MED ORDER — HYDROCODONE-ACETAMINOPHEN 7.5-325 MG PO TABS: 1.0000 | ORAL_TABLET | Freq: Four times a day (QID) | ORAL | Status: DC | PRN

## 2024-05-06 NOTE — ED Provider Notes (Signed)
 John Bridges   CSN: [id number] Arrival date & time: 05/06/24  1429     Patient presents with: Chills   John Bridges Sr. is a 68 y.o. male.  He is brought in by his son for evaluation of weakness.  For 3 days he has been complaining of feeling weak, feverish and chills.  Blurry vision.  Denies any cough chest pain abdominal pain vomiting diarrhea or urinary symptoms.  No sick contacts or recent travel.  Just saw his doctor yesterday and was prescribed sulfasalazine and prednisone  for arthritis.  {Add pertinent medical, surgical, social history, OB history to YEP:67052} The history is provided by the patient.  Weakness Severity:  Moderate Onset quality:  Gradual Duration:  3 days Timing:  Constant Progression:  Unchanged Chronicity:  New Relieved by:  Nothing Worsened by:  Activity Associated symptoms: fever and vision change   Associated symptoms: no abdominal pain, no chest pain, no cough, no diarrhea, no dysuria, no nausea, no shortness of breath and no vomiting        Prior to Admission medications  Medication Sig Start Date End Date Taking? Authorizing Provider  albuterol  (PROVENTIL  HFA;VENTOLIN  HFA) 108 (90 Base) MCG/ACT inhaler Inhale 2 puffs every 6 (six) hours as needed into the lungs for wheezing or shortness of breath.    [provider]  ALPRAZolam (XANAX) 0.5 MG tablet Take 0.5 mg at bedtime as needed by mouth for anxiety or sleep.  02/24/17   [provider]  amitriptyline (ELAVIL) 10 MG tablet Take 10 mg by mouth at bedtime. 07/03/23   [provider]  amLODipine  (NORVASC ) 10 MG tablet Take 10 mg by mouth daily.    [provider]  atorvastatin  (LIPITOR) 40 MG tablet Take 40 mg by mouth every morning.  03/17/17   [provider]  benazepril (LOTENSIN) 40 MG tablet Take 40 mg by mouth daily. 10/24/21   [provider]  gabapentin (NEURONTIN) 100 MG capsule  Take 100 mg by mouth 3 (three) times daily.    [provider]  hydrochlorothiazide (HYDRODIURIL) 25 MG tablet Take 25 mg by mouth daily.    [provider]  HYDROcodone -acetaminophen  (NORCO) 7.5-325 MG tablet Take 1 tablet by mouth every 6 (six) hours as needed for moderate pain (pain score 4-6). 03/25/24   Onesimo Oneil LABOR, MD  ipratropium-albuterol  (DUONEB) 0.5-2.5 (3) MG/3ML SOLN  06/20/23   [provider]  olmesartan (BENICAR) 40 MG tablet Take 40 mg by mouth daily. 08/06/23   [provider]  Omega-3 Fatty Acids (FISH OIL PO) Take 1 capsule daily by mouth.    [provider]  ondansetron  (ZOFRAN ) 4 MG tablet Take 1 tablet (4 mg total) by mouth as needed for nausea or vomiting. 11/24/15   Geronimo Amel, MD  pantoprazole  (PROTONIX ) 40 MG tablet Take 1 tablet (40 mg total) by mouth daily. 09/11/13   Zammit, Joseph, MD  Potassium 99 MG TABS Take 99 mg by mouth every morning.    [provider]  TRELEGY ELLIPTA 100-62.5-25 MCG/ACT AEPB Inhale 1 puff into the lungs daily. 08/15/21   [provider]  triamcinolone cream (KENALOG) 0.1 % Apply 1 application daily as needed topically (for rash on feet).     [provider]  umeclidinium-vilanterol (ANORO ELLIPTA ) 62.5-25 MCG/INH AEPB Inhale 1 puff into the lungs daily.    [provider]    Allergies: Patient has no known allergies.    Review  of Systems  Constitutional:  Positive for fever.  Respiratory:  Negative for cough and shortness of breath.   Cardiovascular:  Negative for chest pain.  Gastrointestinal:  Negative for abdominal pain, diarrhea, nausea and vomiting.  Genitourinary:  Negative for dysuria.  Neurological:  Positive for weakness.    Updated Vital Signs BP (!) 76/59 (BP Location: Right Arm)   Pulse (!) 120   Temp 98.9 F (37.2 C) (Oral)   Resp 20   Ht 5' 10 (1.778 m)   Wt 98 kg   BMI 30.99 kg/m   Physical Exam Vitals and nursing Bridges  reviewed.  Constitutional:      Appearance: Normal appearance. He is well-developed.  HENT:     Head: Normocephalic and atraumatic.  Eyes:     Conjunctiva/sclera: Conjunctivae normal.  Cardiovascular:     Rate and Rhythm: Normal rate and regular rhythm.     Heart sounds: No murmur heard. Pulmonary:     Effort: Pulmonary effort is normal. No respiratory distress.     Breath sounds: Normal breath sounds.  Abdominal:     Palpations: Abdomen is soft.     Tenderness: There is no abdominal tenderness. There is no guarding or rebound.  Musculoskeletal:        General: No deformity.     Cervical back: Neck supple.  Skin:    General: Skin is warm and dry.  Neurological:     General: No focal deficit present.     Mental Status: He is alert.     GCS: GCS eye subscore is 4. GCS verbal subscore is 5. GCS motor subscore is 6.     Motor: No weakness.     (all labs ordered are listed, but only abnormal results are displayed) Labs Reviewed  CBG MONITORING, ED - Abnormal; Notable for the following components:      Result Value   Glucose-Capillary 120 (*)    All other components within normal limits  CULTURE, BLOOD (ROUTINE X 2)  CULTURE, BLOOD (ROUTINE X 2)  RESP PANEL BY RT-PCR (RSV, FLU A&B, COVID)  [id number]  COMPREHENSIVE METABOLIC PANEL WITH GFR  LACTIC ACID, PLASMA  LACTIC ACID, PLASMA  CBC WITH DIFFERENTIAL/PLATELET  PROTIME-INR  URINALYSIS, W/ REFLEX TO CULTURE (INFECTION SUSPECTED)  TROPONIN T, HIGH SENSITIVITY    EKG: None  Radiology: No results found.  {Document cardiac monitor, telemetry assessment procedure when appropriate:32947} Procedures   Medications Ordered in the ED  lactated ringers  infusion (has no administration in time range)  lactated ringers  bolus 1,000 mL (has no administration in time range)    And  lactated ringers  bolus 1,000 mL (has no administration in time range)    And  lactated ringers  bolus 1,000 mL (has no administration in time range)   cefTRIAXone  (ROCEPHIN ) 2 g in sodium chloride  0.9 % 100 mL IVPB (has no administration in time range)  azithromycin  (ZITHROMAX ) 500 mg in sodium chloride  0.9 % 250 mL IVPB (has no administration in time range)    Clinical Course as of 05/06/24 2009  Thu May 06, 2024  1525 Chest x-ray interpreted by me as no clear infiltrate.  Awaiting radiology reading. [MB]  1703 EF was 55 to 60% in 2017 [MB]  1822 Patient's infectious workup without clear source.  He has cleared his lactate and blood pressures improved although not back to baseline.  He is comfortable plan for admission for continued hydration and workup. [MB]  1844 Discussed with [facility] hospitalist Dr. Venetta who will evaluate for  admission. [MB]  2009 Dr. Sherlon I asked if I could check his intraocular pressures.  I got 18 on the right and 17 on the left. [MB]    Clinical Course User Index [MB] Towana Ozell BROCKS, MD   {Click here for ABCD2, HEART and other calculators REFRESH Bridges before signing:1}                              Medical Decision Making Amount and/or Complexity of Data Reviewed Labs: ordered. Radiology: ordered.  Risk Prescription drug management. Decision regarding hospitalization.   This patient complains of ***; this involves an extensive number of treatment Options and is a complaint that carries with it a high risk of complications and morbidity. The differential includes ***  I ordered, reviewed and interpreted labs, which included *** I ordered medication *** and reviewed PMP when indicated. I ordered imaging studies which included *** and I independently    visualized and interpreted imaging which showed *** Additional history obtained from *** Previous records obtained and reviewed *** I consulted *** and discussed lab and imaging findings and discussed disposition.  Cardiac monitoring reviewed, *** Social determinants considered, *** Critical Interventions: ***  After the interventions  stated above, I reevaluated the patient and found *** Admission and further testing considered, ***   {Document critical care time when appropriate  Document review of labs and clinical decision tools ie CHADS2VASC2, etc  Document your independent review of radiology images and any outside records  Document your discussion with family members, caretakers and with consultants  Document social determinants of health affecting pt's care  Document your decision making why or why not admission, treatments were needed:32947:::1}   Final diagnoses:  None    ED Discharge Orders     None

## 2024-05-06 NOTE — ED Notes (Signed)
 Pt oxygen levels have been inconsistent however I changed the probe to an adult probe and moved it from his ear to his right hand as well as put him on 4L [place], pt is very congested and was stating in the 70s/80s, after administering 4L of o2 [place] he is consistently stating at 94%

## 2024-05-06 NOTE — ED Notes (Signed)
 While triaging pt became hard to understand his words. Pt seemed frustrated trying to get his words out. Pt began to sweat. Bp 70 systolic. Pt taken back to a room and edp brought to bedside.

## 2024-05-06 NOTE — H&P (Signed)
 [facility] H&P   Patient Demographics:    John Bridges, is a 68 y.o. male  MRN: [id number]   DOB - 1955-10-03  Admit Date - 05/06/2024  Outpatient Primary MD for the patient is Orpha Yancey LABOR, MD    Chief Complaint  Patient presents with   Chills      HPI:    [name]  is a 68 y.o. male, with past medical history of arthritis (inflammatory versus osteoarthritis) COPD, hypertension, GERD, hyperlipidemia, patient presents to ED secondary to complaints of chills, weakness, sweating and fatigue, patient reports he has been feeling ill for the last week, he was recently started on sulfasalazine and prednisone  by his rheumatologist for possible inflammatory arthritis??,  He has been feeling well for last week so he was asked by his rheumatologist/PCP as well to stop sulfasalazine and prednisone , last week, he reports fever, chills at home, generalized weakness and fatigue - In ED patient was noted to be tachycardic, hypotensive, elevated lactic acid 2.3, no leukocytosis, creatinine was up to 2.22, UA was negative, blood cultures were sent, negative flu/COVID/RSV, CT chest/abdomen/pelvis with no clear infectious process, [facility] consulted to admit.    Review of systems:     A full 10 point Review of Systems was done, except as stated above, all other Review of Systems were negative.   With Past History of the following :    Past Medical History:  Diagnosis Date   Arthritis    COPD (chronic obstructive pulmonary disease) (HCC)    Depression    Essential hypertension    H/O gastroesophageal reflux (GERD)    History of bronchitis    Hyperlipidemia    Lung nodule    Right upper lobe lung nodule 1.3 cm    Shortness of breath dyspnea       Past Surgical History:  Procedure Laterality Date   CARPAL TUNNEL RELEASE Right 12/17/2018   Procedure: CARPAL TUNNEL RELEASE;   Surgeon: Margrette Taft BRAVO, MD;  Location: [facility];  Service: Orthopedics;  Laterality: Right;   COLONOSCOPY     KNEE ARTHROSCOPY WITH MEDIAL MENISECTOMY Right 04/03/2017   Procedure: KNEE ARTHROSCOPY WITH MEDIAL MENISECTOMY;  Surgeon: Margrette Taft BRAVO, MD;  Location: [facility];  Service: Orthopedics;  Laterality: Right;   LEG SURGERY Left    plate inserted and removed.   THORACOTOMY Right 09/25/2015   Procedure: MINI/LIMITED THORACOTOMY WITH RIGHT UPPER LOBE WEDGE RESECTION;  Surgeon: Dallas KATHEE Jude, MD;  Location: [facility];  Service: Thoracic;  Laterality: Right;   TOOTH EXTRACTION     VIDEO BRONCHOSCOPY N/A 09/25/2015   Procedure: VIDEO BRONCHOSCOPY;  Surgeon: Dallas KATHEE Jude, MD;  Location: [facility];  Service: Thoracic;  Laterality: N/A;      Social History:     Social History   Tobacco Use   Smoking status: Former    Current packs/day: 0.00  Average packs/day: 1 pack/day for 40.0 years (40.0 ttl pk-yrs)    Types: Cigarettes    Start date: 08/16/1975    Quit date: 08/16/2015    Years since quitting: 8.7   Smokeless tobacco: Former  Substance Use Topics   Alcohol use: Yes    Alcohol/week: 0.0 standard drinks of alcohol    Comment: 3-4 times a week       Family History :     Family History  Problem Relation Age of Onset   Hypertension Mother    Hypertension Father    Cancer Brother    CAD Brother    Stroke Brother       Home Medications:   Prior to Admission medications  Medication Sig Start Date End Date Taking? Authorizing Provider  albuterol  (PROVENTIL  HFA;VENTOLIN  HFA) 108 (90 Base) MCG/ACT inhaler Inhale 2 puffs every 6 (six) hours as needed into the lungs for wheezing or shortness of breath.    [provider]  ALPRAZolam (XANAX) 0.5 MG tablet Take 0.5 mg at bedtime as needed by mouth for anxiety or sleep.  02/24/17   [provider]  amitriptyline (ELAVIL) 10 MG tablet Take 10 mg by mouth at bedtime. 07/03/23   [provider]   amLODipine  (NORVASC ) 10 MG tablet Take 10 mg by mouth daily.    [provider]  atorvastatin  (LIPITOR) 40 MG tablet Take 40 mg by mouth every morning.  03/17/17   [provider]  benazepril (LOTENSIN) 40 MG tablet Take 40 mg by mouth daily. 10/24/21   [provider]  gabapentin (NEURONTIN) 100 MG capsule Take 100 mg by mouth 3 (three) times daily.    [provider]  hydrochlorothiazide (HYDRODIURIL) 25 MG tablet Take 25 mg by mouth daily.    [provider]  HYDROcodone -acetaminophen  (NORCO) 7.5-325 MG tablet Take 1 tablet by mouth every 6 (six) hours as needed for moderate pain (pain score 4-6). 03/25/24   Onesimo Oneil LABOR, MD  ipratropium-albuterol  (DUONEB) 0.5-2.5 (3) MG/3ML SOLN  06/20/23   [provider]  olmesartan (BENICAR) 40 MG tablet Take 40 mg by mouth daily. 08/06/23   [provider]  Omega-3 Fatty Acids (FISH OIL PO) Take 1 capsule daily by mouth.    [provider]  ondansetron  (ZOFRAN ) 4 MG tablet Take 1 tablet (4 mg total) by mouth as needed for nausea or vomiting. 11/24/15   Geronimo Amel, MD  pantoprazole  (PROTONIX ) 40 MG tablet Take 1 tablet (40 mg total) by mouth daily. 09/11/13   Zammit, Joseph, MD  Potassium 99 MG TABS Take 99 mg by mouth every morning.    [provider]  TRELEGY ELLIPTA 100-62.5-25 MCG/ACT AEPB Inhale 1 puff into the lungs daily. 08/15/21   [provider]  triamcinolone cream (KENALOG) 0.1 % Apply 1 application daily as needed topically (for rash on feet).     [provider]  umeclidinium-vilanterol (ANORO ELLIPTA ) 62.5-25 MCG/INH AEPB Inhale 1 puff into the lungs daily.    [provider]     Allergies:    Allergies[1]   Physical Exam:   Vitals  Blood pressure (!) 110/93, pulse 74, temperature 98.9 F (37.2 C), temperature source Oral, resp. rate 17, height 5' 10 (1.778 m), weight 98 kg, SpO2 92%.   1. General Well-developed, elderly  male, laying in bed, no apparent distress  2. Normal affect and insight, Not Suicidal or Homicidal, Awake Alert, Oriented X 3.  3. No F.N deficits, ALL C.Nerves Intact, Strength  5/5 all 4 extremities, Sensation intact all 4 extremities, Plantars down going.  4. Ears and Eyes appear Normal, Conjunctivae clear, PERRLA. Moist Oral Mucosa.  5. Supple Neck, No JVD, No Carotid Bruits.  6. Symmetrical Chest wall movement, scattered Rales and rhonchi  7.  Tachycardic, No Gallops, Rubs or Murmurs, No Parasternal Heave.  8. Positive Bowel Sounds, Abdomen Soft, No tenderness, No organomegaly appriciated,No rebound -guarding or rigidity.  9.  No Cyanosis, Normal Skin Turgor, No Skin Rash or Bruise.  10. Good muscle tone,  joints appear normal , no effusions, Normal ROM.     Data Review:    CBC Recent Labs  Lab 05/06/24 1455  WBC 7.4  HGB 14.9  HCT 44.3  PLT 133*  MCV 94.3  MCH 31.7  MCHC 33.6  RDW 13.5  LYMPHSABS 0.4*  MONOABS 0.6  EOSABS 1.0*  BASOSABS 0.0   ------------------------------------------------------------------------------------------------------------------  Chemistries  Recent Labs  Lab 05/06/24 1455  NA 134*  K 3.8  CL 93*  CO2 23  GLUCOSE 108*  BUN 48*  CREATININE 2.22*  CALCIUM  8.8*  AST 35  ALT 23  ALKPHOS 65  BILITOT 0.7   ------------------------------------------------------------------------------------------------------------------ estimated creatinine clearance is 37.9 mL/min (A) (by C-G formula based on SCr of 2.22 mg/dL (H)). ------------------------------------------------------------------------------------------------------------------ No results for input(s): TSH, T4TOTAL, T3FREE, THYROIDAB in the last 72 hours.  Invalid input(s): FREET3  Coagulation profile Recent Labs  Lab 05/06/24 1455  INR 1.1    ------------------------------------------------------------------------------------------------------------------- No results for input(s): DDIMER in the last 72 hours. -------------------------------------------------------------------------------------------------------------------  Cardiac Enzymes No results for input(s): CKMB, TROPONINI, MYOGLOBIN in the last 168 hours.  Invalid input(s): CK ------------------------------------------------------------------------------------------------------------------ No results found for: BNP   ---------------------------------------------------------------------------------------------------------------  Urinalysis    Component Value Date/Time   COLORURINE YELLOW 05/06/2024 1545   APPEARANCEUR HAZY (A) 05/06/2024 1545   LABSPEC 1.016 05/06/2024 1545   PHURINE 5.0 05/06/2024 1545   GLUCOSEU NEGATIVE 05/06/2024 1545   HGBUR NEGATIVE 05/06/2024 1545   BILIRUBINUR NEGATIVE 05/06/2024 1545   KETONESUR NEGATIVE 05/06/2024 1545   PROTEINUR 30 (A) 05/06/2024 1545   NITRITE NEGATIVE 05/06/2024 1545   LEUKOCYTESUR NEGATIVE 05/06/2024 1545    ----------------------------------------------------------------------------------------------------------------   Imaging Results:    CT CHEST ABDOMEN PELVIS WO CONTRAST Result Date: 05/06/2024 EXAM: CT CHEST, ABDOMEN AND PELVIS WITH CONTRAST 05/06/2024 05:24:10 PM TECHNIQUE: CT of the chest, abdomen and pelvis was performed with the administration of intravenous contrast. Multiplanar reformatted images are provided for review. Automated exposure control, iterative reconstruction, and/or weight based adjustment of the mA/kV was utilized to reduce the radiation dose to as low as reasonably achievable. COMPARISON: PET CT 08/14/2015. CLINICAL HISTORY: Sepsis. FINDINGS: CHEST: MEDIASTINUM AND LYMPH NODES: Heart and pericardium are unremarkable. The central airways are clear. No mediastinal, hilar  or axillary lymphadenopathy. LUNGS AND PLEURA: Postsurgical changes and scarring in the right upper lobe in the region of previously identified nodule. There are minimal ground glass opacities in this region. There is mild emphysema. The lungs are otherwise clear. There is a 3 mm right middle lobe nodule (image 5/98). There is a 3 mm nodule in the right lung base (image 5/123). There is a 2 mm nodule in the left lower lobe (image 5/98). There is no pleural effusion or pneumothorax. ABDOMEN AND PELVIS: LIVER: The liver is unremarkable. GALLBLADDER AND BILE DUCTS: Gallbladder is unremarkable. No biliary ductal dilatation. SPLEEN: No acute abnormality. PANCREAS: No acute abnormality. ADRENAL GLANDS: No acute abnormality. KIDNEYS, URETERS AND BLADDER: Mild bilateral nonspecific perinephric fat stranding  similar to the prior study. No stones in the kidneys or ureters. No hydronephrosis. No perinephric or periureteral stranding. Left renal cysts are again seen measuring up to 4.8 cm which have increased in size. Per consensus, no follow-up is needed for simple Bosniak type 1 and 2 renal cysts, unless the patient has a malignancy history or risk factors. Urinary bladder is unremarkable. GI AND BOWEL: Stomach demonstrates no acute abnormality. The appendix appears within normal limits. There are a few scattered colonic diverticula. There is no bowel obstruction. REPRODUCTIVE ORGANS: The prostate gland is mildly enlarged. PERITONEUM AND RETROPERITONEUM: No ascites. No free air. VASCULATURE: Aorta is normal in caliber. There are atherosclerotic calcifications of the abdominal aorta and iliac arteries. ABDOMINAL AND PELVIS LYMPH NODES: No lymphadenopathy. BONES AND SOFT TISSUES: Degenerative changes affect the spine. No acute osseous abnormality. No focal soft tissue abnormality. IMPRESSION: 1. No acute findings in the chest, abdomen, or pelvis. 2. Postsurgical changes and scarring in the right upper lobe with minimal adjacent  ground-glass opacities. 3. Mild emphysema, an independent risk factor for lung cancer; recommend consideration for evaluation for a low-dose CT lung cancer screening program. 4. Multiple tiny pulmonary nodules measuring up to 3 mm; no routine follow-up imaging is recommended per Fleischner Society Guidelines. 5. Mild bilateral nonspecific perinephric fat stranding. Correlate clinically for chronic kidney disease or infection. No hydronephrosis or urinary tract calculus. 6. Left renal cysts measuring up to 4.8 cm, increased in size. Electronically signed by: Greig Pique MD 05/06/2024 06:08 PM EST RP Workstation: [id number]   CT Head Wo Contrast Result Date: 05/06/2024 EXAM: CT HEAD WITHOUT CONTRAST 05/06/2024 05:24:10 PM TECHNIQUE: CT of the head was performed without the administration of intravenous contrast. Automated exposure control, iterative reconstruction, and/or weight based adjustment of the mA/kV was utilized to reduce the radiation dose to as low as reasonably achievable. COMPARISON: None available. CLINICAL HISTORY: Mental status change, unknown cause FINDINGS: BRAIN AND VENTRICLES: No acute hemorrhage. No evidence of acute infarct. No hydrocephalus. No extra-axial collection. No mass effect or midline shift. Atherosclerotic calcifications within the cavernous internal carotid arteries. ORBITS: No acute abnormality. SINUSES: Mild right maxillary sinus mucosal thickening. SOFT TISSUES AND SKULL: No acute soft tissue abnormality. No skull fracture. IMPRESSION: 1. No acute intracranial abnormality. 2. Atherosclerotic calcifications within the cavernous internal carotid arteries. Electronically signed by: Dorethia Molt MD 05/06/2024 06:00 PM EST RP Workstation: [id number]   DG Chest Port 1 View if patient is in a treatment room. Result Date: 05/06/2024 EXAM: 1 VIEW(S) XRAY OF THE CHEST 05/06/2024 03:19:00 PM COMPARISON: CXR 02/29/2016, CT CHEST 05/08/2016. CLINICAL HISTORY: Suspected Sepsis.  FINDINGS: LUNGS AND PLEURA: Right upper lobe pulmonary sutures staples. No focal pulmonary opacity. No pleural effusion. No pneumothorax. HEART AND MEDIASTINUM: No acute abnormality of the cardiac and mediastinal silhouettes. BONES AND SOFT TISSUES: Thoracic spondylosis. IMPRESSION: 1. No acute findings. Electronically signed by: Morgane Naveau MD 05/06/2024 03:45 PM EST RP Workstation: [id number]    My personal review of EKG: Rhythm sinus tachycardia, rate 99/min, QTc 420, no Acute ST changes   Assessment & Plan:    Principal Problem:   Sepsis (HCC) Active Problems:   COPD (chronic obstructive pulmonary disease) (HCC)  Sepsis, POA -Patient with sepsis, POA, reports fever and chills at home 100.3 at home, with lactic acidosis, hypotension, tachypnea and tachycardia -He is immunocompromised as on sulfasalazine and steroids. - Will give 1 dose stress dose hydrocortisone  -So far no clear etiology for his sepsis, no acute findings CT chest/abdomen/pelvis - UA  with no acute findings. - Follow-up blood cultures -continue with broad-spectrum antibiotics given he is immunocompromised  AKI -Most likely renal due to volume depletion, and hypotension -Continue with IV fluids and avoid nephrotoxic medications - Hold benazepril - Avoid low blood pressure  Hypertension Currently hypotensive -With history of hypertension, on multiple antihypertensive medications - He is hypotensive requiring multiple fluid boluses - Continue to hold all these meds, will start on midodrine , continue with aggressive IV hydration - Likely on discharge.  Will hold some of his antihypertensive medications and patient/family report persistent hypotension at baseline with regular follow-ups  Inflammatory  arthritis?? - Following with rheumatology, he was started on sulfasalazine and prednisone  last month, was instructed to hold them both this week and his symptoms per his rheumatologist. - Continue to hold these  meds - I will give only 1 dose of stress dose IV hydrocortisone  as well anticipate blood pressure should improve once his blood pressure medicine washed out of his system  Blurry vision in left eye - Reports this has started after sulfasalazine, no acute finding on CT head, reports currently resolved, will obtain MRI brain in a.m. - Intraocular pressure was by ED physician, was normal, 18 right, 17 left.   hyponatremia - Volume depletion, continue with IV fluids  GERD -continue with PPI  COPD - Wheezing, continue with as needed albuterol  and home nebs  DVT Prophylaxis Heparin    AM Labs Ordered, also please review Full Orders  Family Communication: Admission, patients condition and plan of care including tests being ordered have been discussed with the patient and son who indicate understanding and agree with the plan and Code Status.  Code Status full code  Likely DC to home  Consults called: None  Admission status: Inpatient  Time spent in minutes : 70 minutes   Brayton Lye M.D on 05/06/2024 at 7:23 PM   [facility] - Office  [phone]        [1] No Known Allergies

## 2024-05-06 NOTE — ED Triage Notes (Signed)
 Pt c/o of chills that come and go x3days. Also c/o of his vision flashing on and off x1 month in left eye. Pt is tachycardic in triage w/ HR of 120 and BP 78/51.

## 2024-05-06 NOTE — Consult Note (Signed)
 Pharmacy Antibiotic Note  John Bridges. is a 68 y.o. male admitted on 05/06/2024 with sepsis.  Pharmacy has been consulted for cefepime  and vancomycin  dosing.  Scr 2.22, per provider patient has AKI  Plan: Start cefepime  2 grams IV every 12 hours Give vancomycin  2000 mg IV x 1 Will use variable vancomycin  dosing due to AKI Flagyl  500 mg IV every 12 hours per provider Follow renal function and cultures for adjustments  Height: 5' 10 (177.8 cm) Weight: 98 kg (216 lb) IBW/kg (Calculated) : 73  Temp (24hrs), Avg:98.9 F (37.2 C), Min:98.9 F (37.2 C), Max:98.9 F (37.2 C)  Recent Labs  Lab 05/06/24 1455 05/06/24 1705  WBC 7.4  --   CREATININE 2.22*  --   LATICACIDVEN 2.3* 1.3    Estimated Creatinine Clearance: 37.9 mL/min (A) (by C-G formula based on SCr of 2.22 mg/dL (H)).    Allergies[1]  Antimicrobials this admission: Vancomycin  12/18 >>  Cefepime  12/18 >>  Flagyl  12/18 >>  Dose adjustments this admission: N/A  Microbiology results: 12/18 BCx: pending   Thank you for allowing pharmacy to be a part of this patients care.  John Bridges, PharmD 05/06/2024 7:32 PM     [1] No Known Allergies

## 2024-05-06 NOTE — Sepsis Progress Note (Signed)
 eLink is following this Code Sepsis.

## 2024-05-07 ENCOUNTER — Inpatient Hospital Stay (HOSPITAL_COMMUNITY)

## 2024-05-07 DIAGNOSIS — J439 Emphysema, unspecified: Secondary | ICD-10-CM | POA: Diagnosis not present

## 2024-05-07 LAB — BASIC METABOLIC PANEL WITH GFR
Anion gap: 6 (ref 5–15)
BUN: 38 mg/dL — ABNORMAL HIGH (ref 8–23)
CO2: 31 mmol/L (ref 22–32)
Calcium: 8.4 mg/dL — ABNORMAL LOW (ref 8.9–10.3)
Chloride: 98 mmol/L (ref 98–111)
Creatinine, Ser: 1.43 mg/dL — ABNORMAL HIGH (ref 0.61–1.24)
GFR, Estimated: 54 mL/min — ABNORMAL LOW
Glucose, Bld: 123 mg/dL — ABNORMAL HIGH (ref 70–99)
Potassium: 4 mmol/L (ref 3.5–5.1)
Sodium: 136 mmol/L (ref 135–145)

## 2024-05-07 LAB — CBC
HCT: 37.9 % — ABNORMAL LOW (ref 39.0–52.0)
Hemoglobin: 12.7 g/dL — ABNORMAL LOW (ref 13.0–17.0)
MCH: 32.1 pg (ref 26.0–34.0)
MCHC: 33.5 g/dL (ref 30.0–36.0)
MCV: 95.7 fL (ref 80.0–100.0)
Platelets: 115 K/uL — ABNORMAL LOW (ref 150–400)
RBC: 3.96 MIL/uL — ABNORMAL LOW (ref 4.22–5.81)
RDW: 13.6 % (ref 11.5–15.5)
WBC: 5.4 K/uL (ref 4.0–10.5)
nRBC: 0 % (ref 0.0–0.2)

## 2024-05-07 LAB — BLOOD CULTURE ID PANEL (REFLEXED) - BCID2

## 2024-05-07 LAB — MRSA NEXT GEN BY PCR, NASAL: MRSA by PCR Next Gen: NOT DETECTED

## 2024-05-07 LAB — HIV ANTIBODY (ROUTINE TESTING W REFLEX): HIV Screen 4th Generation wRfx: NONREACTIVE

## 2024-05-07 MED ORDER — SODIUM CHLORIDE 0.9 % IV SOLN
2.0000 g | Freq: Three times a day (TID) | INTRAVENOUS | Status: DC
  Administered 2024-05-07 – 2024-05-08 (×3): 2 g via INTRAVENOUS
  Filled 2024-05-07 (×3): qty 12.5

## 2024-05-07 MED ORDER — VANCOMYCIN HCL 1500 MG/300ML IV SOLN
1500.0000 mg | INTRAVENOUS | Status: DC
  Administered 2024-05-07: 1500 mg via INTRAVENOUS
  Filled 2024-05-07: qty 300

## 2024-05-07 NOTE — Progress Notes (Addendum)
 Consultation Progress Note   Patient: John Bridges FMW:[id number] DOB: 1955-09-18 DOA: 05/06/2024 DOS: the patient was seen and examined on 05/07/2024 Primary service: DibiaLandon BRAVO, MD  Brief hospital course: 68 y.o. male, with past medical history of arthritis (inflammatory versus osteoarthritis) COPD, hypertension, GERD, hyperlipidemia, patient presents to ED secondary to complaints of chills, weakness, sweating and fatigue, patient reports he has been feeling ill for the last week, he was recently started on sulfasalazine and prednisone  by his rheumatologist for possible inflammatory arthritis??,  He has been feeling well for last week so he was asked by his rheumatologist/PCP as well to stop sulfasalazine and prednisone , last week, he reports fever, chills at home, generalized weakness and fatigue - In ED patient was noted to be tachycardic, hypotensive, elevated lactic acid 2.3, no leukocytosis, creatinine was up to 2.22, UA was negative, blood cultures were sent, negative flu/COVID/RSV, CT chest/abdomen/pelvis with no clear infectious process,   Assessment and Plan: Sepsis, POA -Blood cx 1/2 bottles growing Gram positive Cocci, await final results - CT chest abdomen and pelvis revealed no acute finding. - UA showed no evidence of UTI. - This morning clinically improved, remains afebrile with no leukocytosis - Continue on cefepime , vancomycin  and Flagyl  AKI -improving with IV fluids -Most likely renal due to volume depletion, and hypotension -Continue with IV fluids and avoid nephrotoxic medications - Hold benazepril - Avoid low blood pressure   Hypertension Currently hypotensive -With history of hypertension, on multiple antihypertensive medications - He was  hypotensive requiring multiple fluid boluses - His BP meds were held and he was started on Midodrine  -This AM blood pressure creeping up to 144 systolic -Discontinue midodrine  that was started during admission for his  hypotension -Slowly resume BP medications if blood pressure worsens.    Inflammatory  arthritis?? - Following with rheumatology, he was started on sulfasalazine and prednisone  last month, was instructed to hold them both this week due to  his symptoms per his rheumatologist. - Continue to hold these meds -Received 1 dose of stress dose IV hydrocortisone  upon admission.  Blurry vision in left eye - Reports this has started after sulfasalazine, no acute finding on CT head, reports currently resolved - Intraocular pressure was by ED physician, was normal, 18 right, 17 left.    hyponatremia - Volume depletion, continue with IV fluids   GERD -continue with PPI   COPD - Wheezing, continue with as needed albuterol  and home nebs   DVT Prophylaxis Heparin     AM Labs Ordered, also please review Full Orders   Family Communication: Admission, patients condition and plan of care including tests being ordered have been discussed with the patient and son who indicate understanding and agree with the plan and Code Status.   Code Status full code   Likely DC to home   Consults called: None   Admission status: Inpatient       [facility] will continue to follow the patient.  Subjective: Patient seen at bedside, he feels better this morning.  He denies any chest pain, shortness of breath, fevers, chills abdominal pain, nausea, vomiting.  He reports improvement with his breathing  Physical Exam: Vitals:   05/07/24 0800 05/07/24 0900 05/07/24 0924 05/07/24 1117  BP: (!) 111/45 (!) 125/51    Pulse: 78 68    Resp: 16 16    Temp:    97.9 F (36.6 C)  TempSrc:    Oral  SpO2:  95% 96%   Weight:  Height:       General  No acute Distress Eyes: PERRL, lids and conjunctivae normal ENMT: Mucous membranes are moist.   Neck: normal, supple, no masses, no thyromegaly Respiratory: clear to auscultation bilaterally, no wheezing, no crackles. Normal respiratory effort. No accessory muscle use.   Cardiovascular: Regular rate and rhythm, no murmurs / rubs / gallops Abdomen: Soft, nontender nondistended. Musculoskeletal: no clubbing / cyanosis. No joint deformity upper and lower extremities.  Skin: no rashes, lesions, ulcers. No induration Neurologic: Facial asymmetry, moving extremity spontaneously, speech fluent. Psychiatric: Normal judgment and insight. Alert and oriented x 3. Normal mood.    . Data Reviewed:    CBC    Component Value Date/Time   WBC 5.4 05/07/2024 0447   RBC 3.96 (L) 05/07/2024 0447   HGB 12.7 (L) 05/07/2024 0447   HCT 37.9 (L) 05/07/2024 0447   PLT 115 (L) 05/07/2024 0447   MCV 95.7 05/07/2024 0447   MCH 32.1 05/07/2024 0447   MCHC 33.5 05/07/2024 0447   RDW 13.6 05/07/2024 0447   LYMPHSABS 0.4 (L) 05/06/2024 1455   MONOABS 0.6 05/06/2024 1455   EOSABS 1.0 (H) 05/06/2024 1455   BASOSABS 0.0 05/06/2024 1455   CMP     Component Value Date/Time   NA 136 05/07/2024 0447   K 4.0 05/07/2024 0447   CL 98 05/07/2024 0447   CO2 31 05/07/2024 0447   GLUCOSE 123 (H) 05/07/2024 0447   BUN 38 (H) 05/07/2024 0447   CREATININE 1.43 (H) 05/07/2024 0447   CALCIUM  8.4 (L) 05/07/2024 0447   PROT 7.1 05/06/2024 1455   ALBUMIN 3.9 05/06/2024 1455   AST 35 05/06/2024 1455   ALT 23 05/06/2024 1455   ALKPHOS 65 05/06/2024 1455   BILITOT 0.7 05/06/2024 1455   GFRNONAA 54 (L) 05/07/2024 0447      Time spent: 35 minutes.  Author: Landon FORBES Baller, MD 05/07/2024 12:37 PM  For on call review [email or web address].

## 2024-05-07 NOTE — Plan of Care (Signed)
" °  Problem: Fluid Volume: Goal: Hemodynamic stability will improve Outcome: Progressing   Problem: Clinical Measurements: Goal: Ability to maintain clinical measurements within normal limits will improve Outcome: Progressing Goal: Respiratory complications will improve Outcome: Progressing Goal: Cardiovascular complication will be avoided Outcome: Progressing   Problem: Coping: Goal: Level of anxiety will decrease Outcome: Progressing   Problem: Elimination: Goal: Will not experience complications related to urinary retention Outcome: Progressing   "

## 2024-05-07 NOTE — TOC CM/SW Note (Signed)
 Transition of Care [facility]) - Inpatient Brief Assessment   Patient Details  Name: John ATCHLEY Sr. MRN: [id number] Date of Birth: [birth year]  Transition of Care [facility]) CM/SW Contact:    Lucie Lunger, LCSWA Phone Number: 05/07/2024, 8:37 AM   Clinical Narrative: Transition of Care Department [facility]) has reviewed patient and no TOC needs have been identified at this time. We will continue to monitor patient advancement through interdiciplinary progression rounds. If new patient transition needs arise, please place a TOC consult.  Transition of Care Asessment: Insurance and Status: Insurance coverage has been reviewed Patient has primary care physician: Yes Home environment has been reviewed: From home Prior level of function:: Independent Prior/Current Home Services: No current home services Social Drivers of Health Review: SDOH reviewed no interventions necessary Readmission risk has been reviewed: Yes Transition of care needs: no transition of care needs at this time

## 2024-05-07 NOTE — Progress Notes (Signed)
 Date and time results received: 05/07/2024 1345 (use smartphrase .now to insert current time)  Test: Blood Cultures Anaerobic bottle Critical Value: Gram + cocci  Name of Provider Notified: Dr. Carlota  Orders Received? Or Actions Taken?: MD notified

## 2024-05-07 NOTE — Progress Notes (Addendum)
 CRITICAL VALUE STICKER  CRITICAL VALUE: 1of 3 bottles of blood cultures was gram positive cocci with staph epidermidis with no resistance.   RECEIVER (on-site recipient of call):Woody Kronberg RN  DATE & TIME NOTIFIED: 05/07/2024 1940  MESSENGER (representative from lab):  MD NOTIFIED: Dr Carlota  TIME OF NOTIFICATION:1945, paged.   RESPONSE:  paged.   2040-Dr Elgergawy and Erminio Cone aware as well.

## 2024-05-07 NOTE — Plan of Care (Signed)

## 2024-05-08 DIAGNOSIS — J449 Chronic obstructive pulmonary disease, unspecified: Secondary | ICD-10-CM

## 2024-05-08 DIAGNOSIS — E871 Hypo-osmolality and hyponatremia: Secondary | ICD-10-CM | POA: Diagnosis not present

## 2024-05-08 DIAGNOSIS — A419 Sepsis, unspecified organism: Secondary | ICD-10-CM | POA: Diagnosis not present

## 2024-05-08 LAB — C DIFFICILE QUICK SCREEN W PCR REFLEX
C Diff antigen: NEGATIVE
C Diff interpretation: NOT DETECTED
C Diff toxin: NEGATIVE

## 2024-05-08 LAB — CREATININE, SERUM
Creatinine, Ser: 1.12 mg/dL (ref 0.61–1.24)
GFR, Estimated: >60 mL/min

## 2024-05-08 MED ORDER — CEFAZOLIN SODIUM-DEXTROSE 1-4 GM/50ML-% IV SOLN
1.0000 g | Freq: Three times a day (TID) | INTRAVENOUS | Status: DC
  Administered 2024-05-08 – 2024-05-09 (×3): 1 g via INTRAVENOUS
  Filled 2024-05-08 (×9): qty 50

## 2024-05-08 MED ORDER — TRAZODONE HCL 50 MG PO TABS
50.0000 mg | ORAL_TABLET | Freq: Every evening | ORAL | Status: DC | PRN
  Administered 2024-05-08: 50 mg via ORAL
  Filled 2024-05-08: qty 1

## 2024-05-08 MED ORDER — LACTATED RINGERS IV SOLN: INTRAVENOUS | Status: AC

## 2024-05-08 NOTE — Progress Notes (Addendum)
 " PROGRESS NOTE   John Bridges  FMW:[id number] DOB: 1955-06-01 DOA: 05/06/2024 PCP: Orpha Yancey LABOR, MD   Chief Complaint  Patient presents with   Chills   Level of care: Telemetry  Brief Admission History:  68 y.o. male, with past medical history of arthritis (inflammatory versus osteoarthritis) COPD, hypertension, GERD, hyperlipidemia, patient presents to ED secondary to complaints of chills, weakness, sweating and fatigue, patient reports he has been feeling ill for the last week, he was recently started on sulfasalazine and prednisone  by his rheumatologist for possible inflammatory arthritis??,  He has been feeling well for last week so he was asked by his rheumatologist/PCP as well to stop sulfasalazine and prednisone , last week, he reports fever, chills at home, generalized weakness and fatigue - In ED patient was noted to be tachycardic, hypotensive, elevated lactic acid 2.3, no leukocytosis, creatinine was up to 2.22, UA was negative, blood cultures were sent, negative flu/COVID/RSV, CT chest/abdomen/pelvis with no clear infectious process reported.   Assessment and Plan:  Sepsis--RESOLVED  - Blood cx 1/2 bottles growing Staph epidermidis - CT chest abdomen and pelvis revealed no acute finding. - UA showed no evidence of UTI. - de-escalate antibiotics today  AKI -- RESOLVED  -improved with IV fluids -Most likely renal due to volume depletion, and hypotension -Continue with IV fluids and avoid nephrotoxic medications - Hold benazepril - Avoid low blood pressure   Hypertension -With history of hypertension, on multiple antihypertensive medications - He was initially hypotensive requiring multiple fluid boluses - His BP meds were held and he was started on Midodrine  -This AM blood pressure creeping up to 144 systolic -Discontinue midodrine  that was started during admission for his hypotension -Slowly resume BP medications as needed     Inflammatory  arthritis -  Following with rheumatology, he was started on sulfasalazine and prednisone  last month, was instructed to hold them both this week due to  his symptoms per his rheumatologist. - Continue to hold these meds -Received 1 dose of stress dose IV hydrocortisone  upon admission.   Blurry vision in left eye - Reports this has started after sulfasalazine, no acute finding on CT head, reports currently resolved - Intraocular pressure was by ED physician, was normal, 18 right, 17 left.    Hyponatremia - Volume depletion, continue with IV fluids   GERD -continue with PPI   COPD - Wheezing, continue with as needed albuterol  and home nebs   DVT prophylaxis: sq heparin  Code Status: Full  Family Communication:  Disposition: home tomorrow    Consultants:   Procedures:   Antimicrobials:    Subjective: Pt reports no longer having chills or feeling feverish.   Objective: Vitals:   05/07/24 1556 05/07/24 1628 05/07/24 1927 05/08/24 0304  BP:   (!) 119/46 91/75  Pulse:   95 75  Resp:    20  Temp:  98.7 F (37.1 C) 98.2 F (36.8 C) 98.4 F (36.9 C)  TempSrc:  Oral Oral Oral  SpO2: 94%  91% 91%  Weight:      Height:        Intake/Output Summary (Last 24 hours) at 05/08/2024 1213 Last data filed at 05/08/2024 0618 Gross per 24 hour  Intake 2177.28 ml  Output 700 ml  Net 1477.28 ml   Filed Weights   05/06/24 1445 05/06/24 2206  Weight: 98 kg 104.1 kg   Examination:  General exam: Appears calm and comfortable  Respiratory system: Clear to auscultation. Respiratory effort normal. Cardiovascular system: normal S1 &  S2 heard. No JVD, murmurs, rubs, gallops or clicks. No pedal edema. Gastrointestinal system: Abdomen is nondistended, soft and nontender. No organomegaly or masses felt. Normal bowel sounds heard. Central nervous system: Alert and oriented. No focal neurological deficits. Extremities: Symmetric 5 x 5 power. Skin: No rashes, lesions or ulcers. Psychiatry: Judgement and  insight appear normal. Mood & affect appropriate.   Data Reviewed: I have personally reviewed following labs and imaging studies  CBC: Recent Labs  Lab 05/06/24 1455 05/07/24 0447  WBC 7.4 5.4  NEUTROABS 5.3  --   HGB 14.9 12.7*  HCT 44.3 37.9*  MCV 94.3 95.7  PLT 133* 115*    Basic Metabolic Panel: Recent Labs  Lab 05/06/24 1455 05/07/24 0447 05/08/24 0242  NA 134* 136  --   K 3.8 4.0  --   CL 93* 98  --   CO2 23 31  --   GLUCOSE 108* 123*  --   BUN 48* 38*  --   CREATININE 2.22* 1.43* 1.12  CALCIUM  8.8* 8.4*  --     CBG: Recent Labs  Lab 05/06/24 1456  GLUCAP 120*    Recent Results (from the past 240 hours)  Resp panel by RT-PCR (RSV, Flu A&B, Covid) Anterior Nasal Swab     Status: None   Collection Time: 05/06/24  2:54 PM   Specimen: Anterior Nasal Swab  Result Value Ref Range Status   SARS Coronavirus 2 by RT PCR NEGATIVE NEGATIVE Final    Comment: (NOTE) SARS-CoV-2 target nucleic acids are NOT DETECTED.  The SARS-CoV-2 RNA is generally detectable in upper respiratory specimens during the acute phase of infection. The lowest concentration of SARS-CoV-2 viral copies this assay can detect is 138 copies/mL. A negative result does not preclude SARS-Cov-2 infection and should not be used as the sole basis for treatment or other patient management decisions. A negative result may occur with  improper specimen collection/handling, submission of specimen other than nasopharyngeal swab, presence of viral mutation(s) within the areas targeted by this assay, and inadequate number of viral copies(<138 copies/mL). A negative result must be combined with clinical observations, patient history, and epidemiological information. The expected result is Negative.  Fact Sheet for Patients:  bloggercourse.com  Fact Sheet for Healthcare Providers:  seriousbroker.it  This test is no t yet approved or cleared by the  [place]  FDA and  has been authorized for detection and/or diagnosis of SARS-CoV-2 by FDA under an Emergency Use Authorization (EUA). This EUA will remain  in effect (meaning this test can be used) for the duration of the COVID-19 declaration under Section 564(b)(1) of the Act, 21 U.S.C.section 360bbb-3(b)(1), unless the authorization is terminated  or revoked sooner.       Influenza A by PCR NEGATIVE NEGATIVE Final   Influenza B by PCR NEGATIVE NEGATIVE Final    Comment: (NOTE) The Xpert Xpress SARS-CoV-2/FLU/RSV plus assay is intended as an aid in the diagnosis of influenza from Nasopharyngeal swab specimens and should not be used as a sole basis for treatment. Nasal washings and aspirates are unacceptable for Xpert Xpress SARS-CoV-2/FLU/RSV testing.  Fact Sheet for Patients: bloggercourse.com  Fact Sheet for Healthcare Providers: seriousbroker.it  This test is not yet approved or cleared by the [place]  FDA and has been authorized for detection and/or diagnosis of SARS-CoV-2 by FDA under an Emergency Use Authorization (EUA). This EUA will remain in effect (meaning this test can be used) for the duration of the COVID-19 declaration under Section 564(b)(1) of the  Act, 21 U.S.C. section 360bbb-3(b)(1), unless the authorization is terminated or revoked.     Resp Syncytial Virus by PCR NEGATIVE NEGATIVE Final    Comment: (NOTE) Fact Sheet for Patients: bloggercourse.com  Fact Sheet for Healthcare Providers: seriousbroker.it  This test is not yet approved or cleared by the [place]  FDA and has been authorized for detection and/or diagnosis of SARS-CoV-2 by FDA under an Emergency Use Authorization (EUA). This EUA will remain in effect (meaning this test can be used) for the duration of the COVID-19 declaration under Section 564(b)(1) of the Act, 21 U.S.C. section  360bbb-3(b)(1), unless the authorization is terminated or revoked.  Performed at [facility], [address] [place], [place] [address]   Culture, blood (Routine x 2)     Status: None (Preliminary result)   Collection Time: 05/06/24  2:55 PM   Specimen: BLOOD RIGHT ARM  Result Value Ref Range Status   Specimen Description   Final    BLOOD RIGHT ARM Performed at [facility], [address] [place], [place] [address]    Special Requests   Final    BOTTLES DRAWN AEROBIC AND ANAEROBIC Blood Culture adequate volume Performed at [facility], [address], [place], [place] [address]    Culture  Setup Time   Final    GRAM POSITIVE COCCI IN BOTH AEROBIC AND ANAEROBIC BOTTLES Gram Stain Report Called to,Read Back By and Verified With: ASHLEY S. ON 05/07/2024 @12 :54PM BY T. HAMER  CRITICAL RESULT CALLED TO, READ BACK BY AND VERIFIED WITH: RN BARI LOVING [id number] AT 1930 BY EC    Culture   Final    GRAM POSITIVE COCCI IDENTIFICATION TO FOLLOW Performed at [facility], [address], [place], [place] [address]    Report Status PENDING  Incomplete  Blood Culture ID Panel (Reflexed)     Status: Abnormal   Collection Time: 05/06/24  2:55 PM  Result Value Ref Range Status   Enterococcus faecalis NOT DETECTED NOT DETECTED Final   Enterococcus Faecium NOT DETECTED NOT DETECTED Final   Listeria monocytogenes NOT DETECTED NOT DETECTED Final   Staphylococcus species DETECTED (A) NOT DETECTED Final    Comment: CRITICAL RESULT CALLED TO, READ BACK BY AND VERIFIED WITH: RN CHRISTY EDWARDS [id number] AT 1930 BY EC    Staphylococcus aureus (BCID) NOT DETECTED NOT DETECTED Final   Staphylococcus epidermidis DETECTED (A) NOT DETECTED Final    Comment: CRITICAL RESULT CALLED TO, READ BACK BY AND VERIFIED WITH: RN CHRISTY EDWARDS [id number] AT 1930 BY EC    Staphylococcus lugdunensis NOT DETECTED NOT DETECTED Final   Streptococcus species NOT DETECTED NOT DETECTED Final    Streptococcus agalactiae NOT DETECTED NOT DETECTED Final   Streptococcus pneumoniae NOT DETECTED NOT DETECTED Final   Streptococcus pyogenes NOT DETECTED NOT DETECTED Final   A.calcoaceticus-baumannii NOT DETECTED NOT DETECTED Final   Bacteroides fragilis NOT DETECTED NOT DETECTED Final   Enterobacterales NOT DETECTED NOT DETECTED Final   Enterobacter cloacae complex NOT DETECTED NOT DETECTED Final   Escherichia coli NOT DETECTED NOT DETECTED Final   Klebsiella aerogenes NOT DETECTED NOT DETECTED Final   Klebsiella oxytoca NOT DETECTED NOT DETECTED Final   Klebsiella pneumoniae NOT DETECTED NOT DETECTED Final   Proteus species NOT DETECTED NOT DETECTED Final   Salmonella species NOT DETECTED NOT DETECTED Final   Serratia marcescens NOT DETECTED NOT DETECTED Final   Haemophilus influenzae NOT DETECTED NOT DETECTED Final   Neisseria meningitidis NOT DETECTED NOT DETECTED Final  Pseudomonas aeruginosa NOT DETECTED NOT DETECTED Final   Stenotrophomonas maltophilia NOT DETECTED NOT DETECTED Final   Candida albicans NOT DETECTED NOT DETECTED Final   Candida auris NOT DETECTED NOT DETECTED Final   Candida glabrata NOT DETECTED NOT DETECTED Final   Candida krusei NOT DETECTED NOT DETECTED Final   Candida parapsilosis NOT DETECTED NOT DETECTED Final   Candida tropicalis NOT DETECTED NOT DETECTED Final   Cryptococcus neoformans/gattii NOT DETECTED NOT DETECTED Final   Methicillin resistance mecA/C NOT DETECTED NOT DETECTED Final    Comment: Performed at [facility], [address], [place], [place] [address]  Culture, blood (Routine x 2)     Status: None (Preliminary result)   Collection Time: 05/06/24  3:01 PM   Specimen: BLOOD  Result Value Ref Range Status   Specimen Description BLOOD BLOOD LEFT ARM AEROBIC BOTTLE ONLY  Final   Special Requests   Final    BOTTLES DRAWN AEROBIC ONLY Blood Culture adequate volume   Culture   Final    NO GROWTH 2 DAYS Performed at [facility], [address] [place], [place] [address]    Report Status PENDING  Incomplete  MRSA Next Gen by PCR, Nasal     Status: None   Collection Time: 05/06/24 10:12 PM   Specimen: Nasal Mucosa; Nasal Swab  Result Value Ref Range Status   MRSA by PCR Next Gen NOT DETECTED NOT DETECTED Final    Comment: (NOTE) The GeneXpert MRSA Assay (FDA approved for NASAL specimens only), is one component of a comprehensive MRSA colonization surveillance program. It is not intended to diagnose MRSA infection nor to guide or monitor treatment for MRSA infections. Test performance is not FDA approved in patients less than 46 years old. Performed at [facility], [address] [place], [place] [address]      Radiology Studies: MRI brain without contrast Result Date: 05/07/2024 EXAM: MRI BRAIN WITHOUT CONTRAST 05/07/2024 02:02:02 PM TECHNIQUE: Multiplanar multisequence MRI of the head/brain was performed without the administration of intravenous contrast. COMPARISON: [facility] colon head CT 05/06/2024. CLINICAL HISTORY: left eye blurry vision FINDINGS: BRAIN AND VENTRICLES: There is no evidence of an acute infarct, intracranial hemorrhage, mass, midline shift, hydrocephalus, or extra-axial fluid collection. There is mild cerebral atrophy. T2 hyperintensities scattered throughout the left greater than right cerebral hemispheric white matter are nonspecific but compatible with mild chronic small vessel ischemic disease. Major intracranial vascular flow voids are preserved. ORBITS: No acute abnormality. SINUSES AND MASTOIDS: Mild mucosal thickening in the paranasal sinuses. Clear mastoid air cells. BONES AND SOFT TISSUES: Normal marrow signal. No acute soft tissue abnormality. IMPRESSION: 1. No acute intracranial abnormality. 2. Mild chronic small vessel ischemic disease. Electronically signed by: Dasie Hamburg MD 05/07/2024 02:09 PM EST RP Workstation: [id number]   CT CHEST ABDOMEN PELVIS WO CONTRAST Result Date:  05/06/2024 EXAM: CT CHEST, ABDOMEN AND PELVIS WITH CONTRAST 05/06/2024 05:24:10 PM TECHNIQUE: CT of the chest, abdomen and pelvis was performed with the administration of intravenous contrast. Multiplanar reformatted images are provided for review. Automated exposure control, iterative reconstruction, and/or weight based adjustment of the mA/kV was utilized to reduce the radiation dose to as low as reasonably achievable. COMPARISON: PET CT 08/14/2015. CLINICAL HISTORY: Sepsis. FINDINGS: CHEST: MEDIASTINUM AND LYMPH NODES: Heart and pericardium are unremarkable. The central airways are clear. No mediastinal, hilar or axillary lymphadenopathy. LUNGS AND PLEURA: Postsurgical changes and scarring in the right upper lobe in the region of previously identified nodule. There are minimal ground glass  opacities in this region. There is mild emphysema. The lungs are otherwise clear. There is a 3 mm right middle lobe nodule (image 5/98). There is a 3 mm nodule in the right lung base (image 5/123). There is a 2 mm nodule in the left lower lobe (image 5/98). There is no pleural effusion or pneumothorax. ABDOMEN AND PELVIS: LIVER: The liver is unremarkable. GALLBLADDER AND BILE DUCTS: Gallbladder is unremarkable. No biliary ductal dilatation. SPLEEN: No acute abnormality. PANCREAS: No acute abnormality. ADRENAL GLANDS: No acute abnormality. KIDNEYS, URETERS AND BLADDER: Mild bilateral nonspecific perinephric fat stranding similar to the prior study. No stones in the kidneys or ureters. No hydronephrosis. No perinephric or periureteral stranding. Left renal cysts are again seen measuring up to 4.8 cm which have increased in size. Per consensus, no follow-up is needed for simple Bosniak type 1 and 2 renal cysts, unless the patient has a malignancy history or risk factors. Urinary bladder is unremarkable. GI AND BOWEL: Stomach demonstrates no acute abnormality. The appendix appears within normal limits. There are a few scattered  colonic diverticula. There is no bowel obstruction. REPRODUCTIVE ORGANS: The prostate gland is mildly enlarged. PERITONEUM AND RETROPERITONEUM: No ascites. No free air. VASCULATURE: Aorta is normal in caliber. There are atherosclerotic calcifications of the abdominal aorta and iliac arteries. ABDOMINAL AND PELVIS LYMPH NODES: No lymphadenopathy. BONES AND SOFT TISSUES: Degenerative changes affect the spine. No acute osseous abnormality. No focal soft tissue abnormality. IMPRESSION: 1. No acute findings in the chest, abdomen, or pelvis. 2. Postsurgical changes and scarring in the right upper lobe with minimal adjacent ground-glass opacities. 3. Mild emphysema, an independent risk factor for lung cancer; recommend consideration for evaluation for a low-dose CT lung cancer screening program. 4. Multiple tiny pulmonary nodules measuring up to 3 mm; no routine follow-up imaging is recommended per Fleischner Society Guidelines. 5. Mild bilateral nonspecific perinephric fat stranding. Correlate clinically for chronic kidney disease or infection. No hydronephrosis or urinary tract calculus. 6. Left renal cysts measuring up to 4.8 cm, increased in size. Electronically signed by: Greig Pique MD 05/06/2024 06:08 PM EST RP Workstation: [id number]   CT Head Wo Contrast Result Date: 05/06/2024 EXAM: CT HEAD WITHOUT CONTRAST 05/06/2024 05:24:10 PM TECHNIQUE: CT of the head was performed without the administration of intravenous contrast. Automated exposure control, iterative reconstruction, and/or weight based adjustment of the mA/kV was utilized to reduce the radiation dose to as low as reasonably achievable. COMPARISON: None available. CLINICAL HISTORY: Mental status change, unknown cause FINDINGS: BRAIN AND VENTRICLES: No acute hemorrhage. No evidence of acute infarct. No hydrocephalus. No extra-axial collection. No mass effect or midline shift. Atherosclerotic calcifications within the cavernous internal carotid arteries.  ORBITS: No acute abnormality. SINUSES: Mild right maxillary sinus mucosal thickening. SOFT TISSUES AND SKULL: No acute soft tissue abnormality. No skull fracture. IMPRESSION: 1. No acute intracranial abnormality. 2. Atherosclerotic calcifications within the cavernous internal carotid arteries. Electronically signed by: Dorethia Molt MD 05/06/2024 06:00 PM EST RP Workstation: [id number]   DG Chest Port 1 View if patient is in a treatment room. Result Date: 05/06/2024 EXAM: 1 VIEW(S) XRAY OF THE CHEST 05/06/2024 03:19:00 PM COMPARISON: CXR 02/29/2016, CT CHEST 05/08/2016. CLINICAL HISTORY: Suspected Sepsis. FINDINGS: LUNGS AND PLEURA: Right upper lobe pulmonary sutures staples. No focal pulmonary opacity. No pleural effusion. No pneumothorax. HEART AND MEDIASTINUM: No acute abnormality of the cardiac and mediastinal silhouettes. BONES AND SOFT TISSUES: Thoracic spondylosis. IMPRESSION: 1. No acute findings. Electronically signed by: Morgane Naveau MD 05/06/2024 03:45 PM EST RP  Workstation: [id number]    Scheduled Meds:  atorvastatin   40 mg Oral BH-q7a   heparin   5,000 Units Subcutaneous Q8H   pantoprazole   40 mg Oral Daily   umeclidinium-vilanterol  1 puff Inhalation Daily   Continuous Infusions:  ceFEPime  (MAXIPIME ) IV 2 g (05/08/24 1002)   metronidazole  500 mg (05/08/24 0830)   vancomycin  150 mL/hr at 05/07/24 2339     LOS: 2 days   Time spent: 56 mins  Rynell Ciotti Vicci, MD How to contact the [facility] Attending or Consulting provider 7A - 7P or covering provider during after hours 7P -7A, for this patient?  Check the care team in [facility] and look for a) attending/consulting TR[facility] provider listed and b) the TRH team listed Log into [email or web address] to find provider on call.  Locate the TRH provider you are looking for under [facility] and page to a number that you can be directly reached. If you still have difficulty reaching the provider, please page the [facility] (Director on Call) for the  Hospitalists listed on amion for assistance.  05/08/2024, 12:13 PM    "

## 2024-05-08 NOTE — Plan of Care (Signed)

## 2024-05-08 NOTE — Plan of Care (Signed)
" °  Problem: Fluid Volume: Goal: Hemodynamic stability will improve Outcome: Progressing   Problem: Clinical Measurements: Goal: Diagnostic test results will improve Outcome: Progressing Goal: Signs and symptoms of infection will decrease Outcome: Progressing   Problem: Respiratory: Goal: Ability to maintain adequate ventilation will improve Outcome: Progressing   Problem: Education: Goal: Knowledge of General Education information will improve Description: Including pain rating scale, medication(s)/side effects and non-pharmacologic comfort measures Outcome: Progressing   Problem: Health Behavior/Discharge Planning: Goal: Ability to manage health-related needs will improve Outcome: Progressing   Problem: Clinical Measurements: Goal: Ability to maintain clinical measurements within normal limits will improve Outcome: Progressing Goal: Will remain free from infection Outcome: Progressing Goal: Diagnostic test results will improve Outcome: Progressing Goal: Respiratory complications will improve Outcome: Progressing Goal: Cardiovascular complication will be avoided Outcome: Progressing   Problem: Activity: Goal: Risk for activity intolerance will decrease Outcome: Progressing   Problem: Nutrition: Goal: Adequate nutrition will be maintained Outcome: Progressing   Problem: Coping: Goal: Level of anxiety will decrease Outcome: Progressing   Problem: Elimination: Goal: Will not experience complications related to bowel motility Outcome: Progressing Goal: Will not experience complications related to urinary retention Outcome: Progressing   Problem: Pain Managment: Goal: General experience of comfort will improve and/or be controlled Outcome: Progressing   Problem: Safety: Goal: Ability to remain free from injury will improve Outcome: Progressing   Problem: Skin Integrity: Goal: Risk for impaired skin integrity will decrease Outcome: Progressing   Problem:  Fluid Volume: Goal: Hemodynamic stability will improve Outcome: Progressing   Problem: Clinical Measurements: Goal: Diagnostic test results will improve Outcome: Progressing Goal: Signs and symptoms of infection will decrease Outcome: Progressing   Problem: Respiratory: Goal: Ability to maintain adequate ventilation will improve Outcome: Progressing   Problem: Education: Goal: Knowledge of General Education information will improve Description: Including pain rating scale, medication(s)/side effects and non-pharmacologic comfort measures Outcome: Progressing   Problem: Health Behavior/Discharge Planning: Goal: Ability to manage health-related needs will improve Outcome: Progressing   Problem: Clinical Measurements: Goal: Ability to maintain clinical measurements within normal limits will improve Outcome: Progressing Goal: Will remain free from infection Outcome: Progressing Goal: Diagnostic test results will improve Outcome: Progressing Goal: Respiratory complications will improve Outcome: Progressing Goal: Cardiovascular complication will be avoided Outcome: Progressing   Problem: Activity: Goal: Risk for activity intolerance will decrease Outcome: Progressing   Problem: Nutrition: Goal: Adequate nutrition will be maintained Outcome: Progressing   Problem: Coping: Goal: Level of anxiety will decrease Outcome: Progressing   Problem: Elimination: Goal: Will not experience complications related to bowel motility Outcome: Progressing Goal: Will not experience complications related to urinary retention Outcome: Progressing   Problem: Pain Managment: Goal: General experience of comfort will improve and/or be controlled Outcome: Progressing   Problem: Safety: Goal: Ability to remain free from injury will improve Outcome: Progressing   Problem: Skin Integrity: Goal: Risk for impaired skin integrity will decrease Outcome: Progressing   "

## 2024-05-08 NOTE — Hospital Course (Signed)
 68 y.o. male, with past medical history of arthritis (inflammatory versus osteoarthritis) COPD, hypertension, GERD, hyperlipidemia, patient presents to ED secondary to complaints of chills, weakness, sweating and fatigue, patient reports he has been feeling ill for the last week, he was recently started on sulfasalazine and prednisone  by his rheumatologist for possible inflammatory arthritis??,  He has been feeling well for last week so he was asked by his rheumatologist/PCP as well to stop sulfasalazine and prednisone , last week, he reports fever, chills at home, generalized weakness and fatigue - In ED patient was noted to be tachycardic, hypotensive, elevated lactic acid 2.3, no leukocytosis, creatinine was up to 2.22, UA was negative, blood cultures were sent, negative flu/COVID/RSV, CT chest/abdomen/pelvis with no clear infectious process reported.

## 2024-05-09 DIAGNOSIS — E871 Hypo-osmolality and hyponatremia: Secondary | ICD-10-CM | POA: Diagnosis not present

## 2024-05-09 DIAGNOSIS — A419 Sepsis, unspecified organism: Secondary | ICD-10-CM | POA: Diagnosis not present

## 2024-05-09 DIAGNOSIS — J449 Chronic obstructive pulmonary disease, unspecified: Secondary | ICD-10-CM | POA: Diagnosis not present

## 2024-05-09 LAB — BASIC METABOLIC PANEL WITH GFR
Anion gap: 5 (ref 5–15)
BUN: 16 mg/dL (ref 8–23)
CO2: 33 mmol/L — ABNORMAL HIGH (ref 22–32)
Calcium: 8.6 mg/dL — ABNORMAL LOW (ref 8.9–10.3)
Chloride: 99 mmol/L (ref 98–111)
Creatinine, Ser: 0.86 mg/dL (ref 0.61–1.24)
GFR, Estimated: >60 mL/min
Glucose, Bld: 107 mg/dL — ABNORMAL HIGH (ref 70–99)
Potassium: 3.7 mmol/L (ref 3.5–5.1)
Sodium: 138 mmol/L (ref 135–145)

## 2024-05-09 LAB — CBC
HCT: 35.1 % — ABNORMAL LOW (ref 39.0–52.0)
Hemoglobin: 12 g/dL — ABNORMAL LOW (ref 13.0–17.0)
MCH: 32.7 pg (ref 26.0–34.0)
MCHC: 34.2 g/dL (ref 30.0–36.0)
MCV: 95.6 fL (ref 80.0–100.0)
Platelets: 135 K/uL — ABNORMAL LOW (ref 150–400)
RBC: 3.67 MIL/uL — ABNORMAL LOW (ref 4.22–5.81)
RDW: 13.5 % (ref 11.5–15.5)
WBC: 7.4 K/uL (ref 4.0–10.5)
nRBC: 0 % (ref 0.0–0.2)

## 2024-05-09 LAB — CULTURE, BLOOD (ROUTINE X 2): Special Requests: ADEQUATE

## 2024-05-09 LAB — PROCALCITONIN: Procalcitonin: 0.25 ng/mL

## 2024-05-09 MED ORDER — SALINE SPRAY 0.65 % NA SOLN
1.0000 | NASAL | Status: DC | PRN
  Filled 2024-05-09: qty 44

## 2024-05-09 NOTE — Progress Notes (Signed)
 SATURATION QUALIFICATIONS: (This note is used to comply with regulatory documentation for home oxygen)  Patient Saturations on Room Air at Rest = 89%  Patient Saturations on Room Air while Ambulating = 84%  Patient Saturations on 2 Liters of oxygen while Ambulating = 98%  Please briefly explain why patient needs home oxygen: COPD exacerbation

## 2024-05-09 NOTE — Discharge Instructions (Addendum)
 IMPORTANT INFORMATION: PAY CLOSE ATTENTION  ? ?PHYSICIAN DISCHARGE INSTRUCTIONS ? ?Follow with Primary care provider  Toma Deiters, MD  and other consultants as instructed by your Hospitalist Physician ? ?SEEK MEDICAL CARE OR RETURN TO EMERGENCY ROOM IF SYMPTOMS COME BACK, WORSEN OR NEW PROBLEM DEVELOPS  ? ?Please note: ?You were cared for by a hospitalist during your hospital stay. Every effort will be made to forward records to your primary care provider.  You can request that your primary care provider send for your hospital records if they have not received them.  Once you are discharged, your primary care physician will handle any further medical issues. Please note that NO REFILLS for any discharge medications will be authorized once you are discharged, as it is imperative that you return to your primary care physician (or establish a relationship with a primary care physician if you do not have one) for your post hospital discharge needs so that they can reassess your need for medications and monitor your lab values. ? ?Please get a complete blood count and chemistry panel checked by your Primary MD at your next visit, and again as instructed by your Primary MD. ? ?Get Medicines reviewed and adjusted: ?Please take all your medications with you for your next visit with your Primary MD ? ?Laboratory/radiological data: ?Please request your Primary MD to go over all hospital tests and procedure/radiological results at the follow up, please ask your primary care provider to get all Hospital records sent to his/her office. ? ?In some cases, they will be blood work, cultures and biopsy results pending at the time of your discharge. Please request that your primary care provider follow up on these results. ? ?If you are diabetic, please bring your blood sugar readings with you to your follow up appointment with primary care.   ? ?Please call and make your follow up appointments as soon as possible.   ? ?Also Note  the following: ?If you experience worsening of your admission symptoms, develop shortness of breath, life threatening emergency, suicidal or homicidal thoughts you must seek medical attention immediately by calling 911 or calling your MD immediately  if symptoms less severe. ? ?You must read complete instructions/literature along with all the possible adverse reactions/side effects for all the Medicines you take and that have been prescribed to you. Take any new Medicines after you have completely understood and accpet all the possible adverse reactions/side effects.  ? ?Do not drive when taking Pain medications or sleeping medications (Benzodiazepines) ? ?Do not take more than prescribed Pain, Sleep and Anxiety Medications. It is not advisable to combine anxiety,sleep and pain medications without talking with your primary care practitioner ? ?Special Instructions: If you have smoked or chewed Tobacco  in the last 2 yrs please stop smoking, stop any regular Alcohol  and or any Recreational drug use. ? ?Wear Seat belts while driving.  Do not drive if taking any narcotic, mind altering or controlled substances or recreational drugs or alcohol.  ? ? ? ? ? ?

## 2024-05-09 NOTE — TOC Transition Note (Signed)
 Transition of Care [facility]) - Discharge Note   Patient Details  Name: John MAGNAN Sr. MRN: [id number] Date of Birth: [birth year]  [facility] CM/SW Contact:  Hoy DELENA Bigness, LCSW Phone Number: 05/09/2024, 12:10 PM   Clinical Narrative:    Home O2 ordered through [employer]. [employer] to deliver portable O2 tank to pt's room prior to discharge.    Final next level of care: Home/Self Care Barriers to Discharge: Barriers Resolved   Patient Goals and CMS Choice Patient states their goals for this hospitalization and ongoing recovery are:: To return home CMS Medicare.gov Compare Post Acute Care list provided to:: Patient Choice offered to / list presented to : Patient      Discharge Placement                       Discharge Plan and Services Additional resources added to the After Visit Summary for                  DME Arranged: Oxygen DME Agency: [employer] Date DME Agency Contacted: 05/09/24 Time DME Agency Contacted: 1210 Representative spoke with at DME Agency: Mitch            Social Drivers of Health (SDOH) Interventions SDOH Screenings   Food Insecurity: No Food Insecurity (05/06/2024)  Housing: Low Risk (05/06/2024)  Transportation Needs: No Transportation Needs (05/06/2024)  Utilities: Not At Risk (05/06/2024)  Social Connections: Unknown (05/06/2024)  Tobacco Use: Medium Risk (05/06/2024)     Readmission Risk Interventions    05/09/2024   12:09 PM  Readmission Risk Prevention Plan  Post Dischage Appt Complete  Medication Screening Complete  Transportation Screening Complete

## 2024-05-09 NOTE — Discharge Summary (Addendum)
 Physician Discharge Summary  John Bridges Sr. FMW:[id number] DOB: Apr 23, 1956 DOA: 05/06/2024  PCP: Orpha Yancey LABOR, MD  Admit date: 05/06/2024 Discharge date: 05/09/2024  Admitted From:  Home Disposition: Home   Recommendations for Outpatient Follow-up:  Follow up with PCP in 1 weeks Follow up with rheumatologist in 1-2 weeks Please obtain BMP/CBC in one week  Home Health: N/A  Discharge Condition: STABLE   CODE STATUS: FULL DIET: heart healthy recommended    Brief Hospitalization Summary: Please see all hospital notes, images, labs for full details of the hospitalization. Admission provider HPI:  68 y.o. male, with past medical history of arthritis (inflammatory versus osteoarthritis) COPD, hypertension, GERD, hyperlipidemia, patient presents to ED secondary to complaints of chills, weakness, sweating and fatigue, patient reports he has been feeling ill for the last week, he was recently started on sulfasalazine and prednisone  by his rheumatologist for possible inflammatory arthritis??,  He has been feeling well for last week so he was asked by his rheumatologist/PCP as well to stop sulfasalazine and prednisone , last week, he reports fever, chills at home, generalized weakness and fatigue - In ED patient was noted to be tachycardic, hypotensive, elevated lactic acid 2.3, no leukocytosis, creatinine was up to 2.22, UA was negative, blood cultures were sent, negative flu/COVID/RSV, CT chest/abdomen/pelvis with no clear infectious process reported.  Hospital Course by listed problems addressed   Sepsis--RESOLVED  - Blood culture in 1/2 bottles growing Staph epidermidis is thought to have been a skin contaminant - CT chest abdomen and pelvis revealed no acute finding. - UA showed no evidence of UTI. - de-escalate antibiotics and discontinued antibiotics today   AKI -- RESOLVED  -improved with IV fluids -Most likely renal due to volume depletion, and hypotension -Treated with IV  fluids and avoid nephrotoxic medications   Hypertension -With history of hypertension, on multiple antihypertensive medications - He was initially hypotensive requiring multiple fluid boluses - His BP meds were held and he was started on Midodrine  - Discontinued midodrine  that was started during admission for his hypotension - resume home BP meds   Inflammatory  arthritis - Following with rheumatology, he was started on sulfasalazine and prednisone  last month, was instructed to hold them both this week due to  his symptoms per his rheumatologist. - Continue to hold these meds and pt advised to follow up with rheumatologist -Received 1 dose of stress dose IV hydrocortisone  upon admission.    Blurry vision in left eye - Reports this has started after sulfasalazine, no acute finding on CT head, reports currently resolved - Intraocular pressure was by ED physician, was normal, 18 right, 17 left.    Hyponatremia--RESOLVED  - Volume depletion, treated with IV fluids   GERD -treated with PPI   COPD - Wheezing, continue with as needed albuterol  and home nebs    Discharge Diagnoses:  Principal Problem:   Sepsis (HCC) Active Problems:   COPD (chronic obstructive pulmonary disease) (HCC)   Discharge Instructions:  Allergies as of 05/09/2024   No Known Allergies      Medication List     STOP taking these medications    predniSONE  10 MG tablet Commonly known as: DELTASONE    sulfaSALAzine 500 MG tablet Commonly known as: AZULFIDINE       TAKE these medications    albuterol  108 (90 Base) MCG/ACT inhaler Commonly known as: VENTOLIN  HFA Inhale 2 puffs every 6 (six) hours as needed into the lungs for wheezing or shortness of breath.   ALPRAZolam 0.5 MG  tablet Commonly known as: XANAX Take 0.5 mg at bedtime as needed by mouth for anxiety or sleep.   amitriptyline 10 MG tablet Commonly known as: ELAVIL Take 10 mg by mouth at bedtime.   atorvastatin  40 MG  tablet Commonly known as: LIPITOR Take 40 mg by mouth every morning.   diphenhydrAMINE  25 mg capsule Commonly known as: BENADRYL  Take 25 mg by mouth every 6 (six) hours as needed.   Fish Oil 1000 MG Caps Take 1 capsule by mouth 3 (three) times daily.   gabapentin 100 MG capsule Commonly known as: NEURONTIN Take 100 mg by mouth 3 (three) times daily.   hydrochlorothiazide 25 MG tablet Commonly known as: HYDRODIURIL Take 25 mg by mouth daily.   HYDROcodone -acetaminophen  7.5-325 MG tablet Commonly known as: NORCO Take 1 tablet by mouth every 6 (six) hours as needed for moderate pain (pain score 4-6).   ipratropium-albuterol  0.5-2.5 (3) MG/3ML Soln Commonly known as: DUONEB   olmesartan 40 MG tablet Commonly known as: BENICAR Take 40 mg by mouth daily.   ondansetron  4 MG disintegrating tablet Commonly known as: ZOFRAN -ODT Take 4 mg by mouth daily.   pantoprazole  40 MG tablet Commonly known as: PROTONIX  Take 1 tablet (40 mg total) by mouth daily.   Potassium 99 MG Tabs Take 99 mg by mouth every morning.   triamcinolone cream 0.1 % Commonly known as: KENALOG Apply 1 application daily as needed topically (for rash on feet).        Follow-up Information     Orpha Yancey LABOR, MD. Schedule an appointment as soon as possible for a visit in 1 week(s).   Specialty: Internal Medicine Why: Hospital Follow Up Contact information: [address] [place] [address] [phone]                Allergies[1] Allergies as of 05/09/2024   No Known Allergies      Medication List     STOP taking these medications    predniSONE  10 MG tablet Commonly known as: DELTASONE    sulfaSALAzine 500 MG tablet Commonly known as: AZULFIDINE       TAKE these medications    albuterol  108 (90 Base) MCG/ACT inhaler Commonly known as: VENTOLIN  HFA Inhale 2 puffs every 6 (six) hours as needed into the lungs for wheezing or shortness of breath.   ALPRAZolam 0.5 MG  tablet Commonly known as: XANAX Take 0.5 mg at bedtime as needed by mouth for anxiety or sleep.   amitriptyline 10 MG tablet Commonly known as: ELAVIL Take 10 mg by mouth at bedtime.   atorvastatin  40 MG tablet Commonly known as: LIPITOR Take 40 mg by mouth every morning.   diphenhydrAMINE  25 mg capsule Commonly known as: BENADRYL  Take 25 mg by mouth every 6 (six) hours as needed.   Fish Oil 1000 MG Caps Take 1 capsule by mouth 3 (three) times daily.   gabapentin 100 MG capsule Commonly known as: NEURONTIN Take 100 mg by mouth 3 (three) times daily.   hydrochlorothiazide 25 MG tablet Commonly known as: HYDRODIURIL Take 25 mg by mouth daily.   HYDROcodone -acetaminophen  7.5-325 MG tablet Commonly known as: NORCO Take 1 tablet by mouth every 6 (six) hours as needed for moderate pain (pain score 4-6).   ipratropium-albuterol  0.5-2.5 (3) MG/3ML Soln Commonly known as: DUONEB   olmesartan 40 MG tablet Commonly known as: BENICAR Take 40 mg by mouth daily.   ondansetron  4 MG disintegrating tablet Commonly known as: ZOFRAN -ODT Take 4 mg by  mouth daily.   pantoprazole  40 MG tablet Commonly known as: PROTONIX  Take 1 tablet (40 mg total) by mouth daily.   Potassium 99 MG Tabs Take 99 mg by mouth every morning.   triamcinolone cream 0.1 % Commonly known as: KENALOG Apply 1 application daily as needed topically (for rash on feet).        Procedures/Studies: MRI brain without contrast Result Date: 05/07/2024 EXAM: MRI BRAIN WITHOUT CONTRAST 05/07/2024 02:02:02 PM TECHNIQUE: Multiplanar multisequence MRI of the head/brain was performed without the administration of intravenous contrast. COMPARISON: [facility] colon head CT 05/06/2024. CLINICAL HISTORY: left eye blurry vision FINDINGS: BRAIN AND VENTRICLES: There is no evidence of an acute infarct, intracranial hemorrhage, mass, midline shift, hydrocephalus, or extra-axial fluid collection. There is mild cerebral atrophy. T2  hyperintensities scattered throughout the left greater than right cerebral hemispheric white matter are nonspecific but compatible with mild chronic small vessel ischemic disease. Major intracranial vascular flow voids are preserved. ORBITS: No acute abnormality. SINUSES AND MASTOIDS: Mild mucosal thickening in the paranasal sinuses. Clear mastoid air cells. BONES AND SOFT TISSUES: Normal marrow signal. No acute soft tissue abnormality. IMPRESSION: 1. No acute intracranial abnormality. 2. Mild chronic small vessel ischemic disease. Electronically signed by: Dasie Hamburg MD 05/07/2024 02:09 PM EST RP Workstation: [id number]   CT CHEST ABDOMEN PELVIS WO CONTRAST Result Date: 05/06/2024 EXAM: CT CHEST, ABDOMEN AND PELVIS WITH CONTRAST 05/06/2024 05:24:10 PM TECHNIQUE: CT of the chest, abdomen and pelvis was performed with the administration of intravenous contrast. Multiplanar reformatted images are provided for review. Automated exposure control, iterative reconstruction, and/or weight based adjustment of the mA/kV was utilized to reduce the radiation dose to as low as reasonably achievable. COMPARISON: PET CT 08/14/2015. CLINICAL HISTORY: Sepsis. FINDINGS: CHEST: MEDIASTINUM AND LYMPH NODES: Heart and pericardium are unremarkable. The central airways are clear. No mediastinal, hilar or axillary lymphadenopathy. LUNGS AND PLEURA: Postsurgical changes and scarring in the right upper lobe in the region of previously identified nodule. There are minimal ground glass opacities in this region. There is mild emphysema. The lungs are otherwise clear. There is a 3 mm right middle lobe nodule (image 5/98). There is a 3 mm nodule in the right lung base (image 5/123). There is a 2 mm nodule in the left lower lobe (image 5/98). There is no pleural effusion or pneumothorax. ABDOMEN AND PELVIS: LIVER: The liver is unremarkable. GALLBLADDER AND BILE DUCTS: Gallbladder is unremarkable. No biliary ductal dilatation. SPLEEN: No  acute abnormality. PANCREAS: No acute abnormality. ADRENAL GLANDS: No acute abnormality. KIDNEYS, URETERS AND BLADDER: Mild bilateral nonspecific perinephric fat stranding similar to the prior study. No stones in the kidneys or ureters. No hydronephrosis. No perinephric or periureteral stranding. Left renal cysts are again seen measuring up to 4.8 cm which have increased in size. Per consensus, no follow-up is needed for simple Bosniak type 1 and 2 renal cysts, unless the patient has a malignancy history or risk factors. Urinary bladder is unremarkable. GI AND BOWEL: Stomach demonstrates no acute abnormality. The appendix appears within normal limits. There are a few scattered colonic diverticula. There is no bowel obstruction. REPRODUCTIVE ORGANS: The prostate gland is mildly enlarged. PERITONEUM AND RETROPERITONEUM: No ascites. No free air. VASCULATURE: Aorta is normal in caliber. There are atherosclerotic calcifications of the abdominal aorta and iliac arteries. ABDOMINAL AND PELVIS LYMPH NODES: No lymphadenopathy. BONES AND SOFT TISSUES: Degenerative changes affect the spine. No acute osseous abnormality. No focal soft tissue abnormality. IMPRESSION: 1. No acute findings in the chest, abdomen,  or pelvis. 2. Postsurgical changes and scarring in the right upper lobe with minimal adjacent ground-glass opacities. 3. Mild emphysema, an independent risk factor for lung cancer; recommend consideration for evaluation for a low-dose CT lung cancer screening program. 4. Multiple tiny pulmonary nodules measuring up to 3 mm; no routine follow-up imaging is recommended per Fleischner Society Guidelines. 5. Mild bilateral nonspecific perinephric fat stranding. Correlate clinically for chronic kidney disease or infection. No hydronephrosis or urinary tract calculus. 6. Left renal cysts measuring up to 4.8 cm, increased in size. Electronically signed by: Greig Pique MD 05/06/2024 06:08 PM EST RP Workstation: [id number]   CT  Head Wo Contrast Result Date: 05/06/2024 EXAM: CT HEAD WITHOUT CONTRAST 05/06/2024 05:24:10 PM TECHNIQUE: CT of the head was performed without the administration of intravenous contrast. Automated exposure control, iterative reconstruction, and/or weight based adjustment of the mA/kV was utilized to reduce the radiation dose to as low as reasonably achievable. COMPARISON: None available. CLINICAL HISTORY: Mental status change, unknown cause FINDINGS: BRAIN AND VENTRICLES: No acute hemorrhage. No evidence of acute infarct. No hydrocephalus. No extra-axial collection. No mass effect or midline shift. Atherosclerotic calcifications within the cavernous internal carotid arteries. ORBITS: No acute abnormality. SINUSES: Mild right maxillary sinus mucosal thickening. SOFT TISSUES AND SKULL: No acute soft tissue abnormality. No skull fracture. IMPRESSION: 1. No acute intracranial abnormality. 2. Atherosclerotic calcifications within the cavernous internal carotid arteries. Electronically signed by: Dorethia Molt MD 05/06/2024 06:00 PM EST RP Workstation: [id number]   DG Chest Port 1 View if patient is in a treatment room. Result Date: 05/06/2024 EXAM: 1 VIEW(S) XRAY OF THE CHEST 05/06/2024 03:19:00 PM COMPARISON: CXR 02/29/2016, CT CHEST 05/08/2016. CLINICAL HISTORY: Suspected Sepsis. FINDINGS: LUNGS AND PLEURA: Right upper lobe pulmonary sutures staples. No focal pulmonary opacity. No pleural effusion. No pneumothorax. HEART AND MEDIASTINUM: No acute abnormality of the cardiac and mediastinal silhouettes. BONES AND SOFT TISSUES: Thoracic spondylosis. IMPRESSION: 1. No acute findings. Electronically signed by: Morgane Naveau MD 05/06/2024 03:45 PM EST RP Workstation: [id number]     Subjective: Pt reports that he is feeling great, no fever or chills and he is eager to go home today.   Discharge Exam: Vitals:   05/08/24 1930 05/09/24 0521  BP: (!) 160/82 124/70  Pulse: (!) 104 77  Resp: 19 20  Temp: 98.1 F  (36.7 C) 97.9 F (36.6 C)  SpO2: 91% 94%   Vitals:   05/08/24 0855 05/08/24 1300 05/08/24 1930 05/09/24 0521  BP:  (!) 150/69 (!) 160/82 124/70  Pulse:  89 (!) 104 77  Resp:   19 20  Temp:  98 F (36.7 C) 98.1 F (36.7 C) 97.9 F (36.6 C)  TempSrc:  Oral Oral Oral  SpO2: 93% 94% 91% 94%  Weight:      Height:       General: Pt is alert, awake, not in acute distress Cardiovascular: normal S1/S2 +, no rubs, no gallops Respiratory: CTA bilaterally, no wheezing, no rhonchi Abdominal: Soft, NT, ND, bowel sounds + Extremities: no edema, no cyanosis   The results of significant diagnostics from this hospitalization (including imaging, microbiology, ancillary and laboratory) are listed below for reference.     Microbiology: Recent Results (from the past 240 hours)  Resp panel by RT-PCR (RSV, Flu A&B, Covid) Anterior Nasal Swab     Status: None   Collection Time: 05/06/24  2:54 PM   Specimen: Anterior Nasal Swab  Result Value Ref Range Status   SARS Coronavirus 2 by RT PCR NEGATIVE  NEGATIVE Final    Comment: (NOTE) SARS-CoV-2 target nucleic acids are NOT DETECTED.  The SARS-CoV-2 RNA is generally detectable in upper respiratory specimens during the acute phase of infection. The lowest concentration of SARS-CoV-2 viral copies this assay can detect is 138 copies/mL. A negative result does not preclude SARS-Cov-2 infection and should not be used as the sole basis for treatment or other patient management decisions. A negative result may occur with  improper specimen collection/handling, submission of specimen other than nasopharyngeal swab, presence of viral mutation(s) within the areas targeted by this assay, and inadequate number of viral copies(<138 copies/mL). A negative result must be combined with clinical observations, patient history, and epidemiological information. The expected result is Negative.  Fact Sheet for Patients:   bloggercourse.com  Fact Sheet for Healthcare Providers:  seriousbroker.it  This test is no t yet approved or cleared by the [place]  FDA and  has been authorized for detection and/or diagnosis of SARS-CoV-2 by FDA under an Emergency Use Authorization (EUA). This EUA will remain  in effect (meaning this test can be used) for the duration of the COVID-19 declaration under Section 564(b)(1) of the Act, 21 U.S.C.section 360bbb-3(b)(1), unless the authorization is terminated  or revoked sooner.       Influenza A by PCR NEGATIVE NEGATIVE Final   Influenza B by PCR NEGATIVE NEGATIVE Final    Comment: (NOTE) The Xpert Xpress SARS-CoV-2/FLU/RSV plus assay is intended as an aid in the diagnosis of influenza from Nasopharyngeal swab specimens and should not be used as a sole basis for treatment. Nasal washings and aspirates are unacceptable for Xpert Xpress SARS-CoV-2/FLU/RSV testing.  Fact Sheet for Patients: bloggercourse.com  Fact Sheet for Healthcare Providers: seriousbroker.it  This test is not yet approved or cleared by the [place]  FDA and has been authorized for detection and/or diagnosis of SARS-CoV-2 by FDA under an Emergency Use Authorization (EUA). This EUA will remain in effect (meaning this test can be used) for the duration of the COVID-19 declaration under Section 564(b)(1) of the Act, 21 U.S.C. section 360bbb-3(b)(1), unless the authorization is terminated or revoked.     Resp Syncytial Virus by PCR NEGATIVE NEGATIVE Final    Comment: (NOTE) Fact Sheet for Patients: bloggercourse.com  Fact Sheet for Healthcare Providers: seriousbroker.it  This test is not yet approved or cleared by the [place]  FDA and has been authorized for detection and/or diagnosis of SARS-CoV-2 by FDA under an Emergency Use  Authorization (EUA). This EUA will remain in effect (meaning this test can be used) for the duration of the COVID-19 declaration under Section 564(b)(1) of the Act, 21 U.S.C. section 360bbb-3(b)(1), unless the authorization is terminated or revoked.  Performed at [facility], [address], [place], [place] [address]   Culture, blood (Routine x 2)     Status: Abnormal   Collection Time: 05/06/24  2:55 PM   Specimen: BLOOD RIGHT ARM  Result Value Ref Range Status   Specimen Description   Final    BLOOD RIGHT ARM Performed at [facility], [address] [place], [place] [address]    Special Requests   Final    BOTTLES DRAWN AEROBIC AND ANAEROBIC Blood Culture adequate volume Performed at [facility], [address], [place], [place] [address]    Culture  Setup Time   Final    GRAM POSITIVE COCCI IN BOTH AEROBIC AND ANAEROBIC BOTTLES Gram Stain Report Called to,Read Back By and Verified With: ASHLEY S. ON 05/07/2024 @12 :54PM BY T.  HAMER  CRITICAL RESULT CALLED TO, READ BACK BY AND VERIFIED WITH: RN BARI LOVING [id number] AT 1930 BY EC    Culture (A)  Final    STAPHYLOCOCCUS EPIDERMIDIS THE SIGNIFICANCE OF ISOLATING THIS ORGANISM FROM A SINGLE SET OF BLOOD CULTURES WHEN MULTIPLE SETS ARE DRAWN IS UNCERTAIN. PLEASE NOTIFY THE MICROBIOLOGY DEPARTMENT WITHIN ONE WEEK IF SPECIATION AND SENSITIVITIES ARE REQUIRED. Performed at [facility], [address], [place], [place] [address]    Report Status 05/09/2024 FINAL  Final  Blood Culture ID Panel (Reflexed)     Status: Abnormal   Collection Time: 05/06/24  2:55 PM  Result Value Ref Range Status   Enterococcus faecalis NOT DETECTED NOT DETECTED Final   Enterococcus Faecium NOT DETECTED NOT DETECTED Final   Listeria monocytogenes NOT DETECTED NOT DETECTED Final   Staphylococcus species DETECTED (A) NOT DETECTED Final    Comment: CRITICAL RESULT CALLED TO, READ BACK BY AND VERIFIED WITH: RN CHRISTY EDWARDS [id number] AT 1930  BY EC    Staphylococcus aureus (BCID) NOT DETECTED NOT DETECTED Final   Staphylococcus epidermidis DETECTED (A) NOT DETECTED Final    Comment: CRITICAL RESULT CALLED TO, READ BACK BY AND VERIFIED WITH: RN BARI BOHR [id number] AT 1930 BY EC    Staphylococcus lugdunensis NOT DETECTED NOT DETECTED Final   Streptococcus species NOT DETECTED NOT DETECTED Final   Streptococcus agalactiae NOT DETECTED NOT DETECTED Final   Streptococcus pneumoniae NOT DETECTED NOT DETECTED Final   Streptococcus pyogenes NOT DETECTED NOT DETECTED Final   A.calcoaceticus-baumannii NOT DETECTED NOT DETECTED Final   Bacteroides fragilis NOT DETECTED NOT DETECTED Final   Enterobacterales NOT DETECTED NOT DETECTED Final   Enterobacter cloacae complex NOT DETECTED NOT DETECTED Final   Escherichia coli NOT DETECTED NOT DETECTED Final   Klebsiella aerogenes NOT DETECTED NOT DETECTED Final   Klebsiella oxytoca NOT DETECTED NOT DETECTED Final   Klebsiella pneumoniae NOT DETECTED NOT DETECTED Final   Proteus species NOT DETECTED NOT DETECTED Final   Salmonella species NOT DETECTED NOT DETECTED Final   Serratia marcescens NOT DETECTED NOT DETECTED Final   Haemophilus influenzae NOT DETECTED NOT DETECTED Final   Neisseria meningitidis NOT DETECTED NOT DETECTED Final   Pseudomonas aeruginosa NOT DETECTED NOT DETECTED Final   Stenotrophomonas maltophilia NOT DETECTED NOT DETECTED Final   Candida albicans NOT DETECTED NOT DETECTED Final   Candida auris NOT DETECTED NOT DETECTED Final   Candida glabrata NOT DETECTED NOT DETECTED Final   Candida krusei NOT DETECTED NOT DETECTED Final   Candida parapsilosis NOT DETECTED NOT DETECTED Final   Candida tropicalis NOT DETECTED NOT DETECTED Final   Cryptococcus neoformans/gattii NOT DETECTED NOT DETECTED Final   Methicillin resistance mecA/C NOT DETECTED NOT DETECTED Final    Comment: Performed at [facility], [address], [place], [place] [address]  Culture, blood  (Routine x 2)     Status: None (Preliminary result)   Collection Time: 05/06/24  3:01 PM   Specimen: BLOOD  Result Value Ref Range Status   Specimen Description BLOOD BLOOD LEFT ARM AEROBIC BOTTLE ONLY  Final   Special Requests   Final    BOTTLES DRAWN AEROBIC ONLY Blood Culture adequate volume   Culture   Final    NO GROWTH 3 DAYS Performed at [facility], [address] [place], [place] [address]    Report Status PENDING  Incomplete  MRSA Next Gen by PCR, Nasal     Status: None   Collection Time: 05/06/24 10:12  PM   Specimen: Nasal Mucosa; Nasal Swab  Result Value Ref Range Status   MRSA by PCR Next Gen NOT DETECTED NOT DETECTED Final    Comment: (NOTE) The GeneXpert MRSA Assay (FDA approved for NASAL specimens only), is one component of a comprehensive MRSA colonization surveillance program. It is not intended to diagnose MRSA infection nor to guide or monitor treatment for MRSA infections. Test performance is not FDA approved in patients less than 62 years old. Performed at [facility], [address], [place], [place] [address]   C Difficile Quick Screen w PCR reflex     Status: None   Collection Time: 05/08/24  1:52 PM   Specimen: STOOL  Result Value Ref Range Status   C Diff antigen NEGATIVE NEGATIVE Final   C Diff toxin NEGATIVE NEGATIVE Final   C Diff interpretation No C. difficile detected.  Final    Comment: Performed at [facility], [address] [place], [place] [address]     Labs: BNP (last 3 results) No results for input(s): BNP in the last 8760 hours. Basic Metabolic Panel: Recent Labs  Lab 05/06/24 1455 05/07/24 0447 05/08/24 0242 05/09/24 0440  NA 134* 136  --  138  K 3.8 4.0  --  3.7  CL 93* 98  --  99  CO2 23 31  --  33*  GLUCOSE 108* 123*  --  107*  BUN 48* 38*  --  16  CREATININE 2.22* 1.43* 1.12 0.86  CALCIUM  8.8* 8.4*  --  8.6*   Liver Function Tests: Recent Labs  Lab 05/06/24 1455  AST 35  ALT 23  ALKPHOS 65  BILITOT 0.7   PROT 7.1  ALBUMIN 3.9   No results for input(s): LIPASE, AMYLASE in the last 168 hours. No results for input(s): AMMONIA in the last 168 hours. CBC: Recent Labs  Lab 05/06/24 1455 05/07/24 0447 05/09/24 0440  WBC 7.4 5.4 7.4  NEUTROABS 5.3  --   --   HGB 14.9 12.7* 12.0*  HCT 44.3 37.9* 35.1*  MCV 94.3 95.7 95.6  PLT 133* 115* 135*   Cardiac Enzymes: No results for input(s): CKTOTAL, CKMB, CKMBINDEX, TROPONINI in the last 168 hours. BNP: Invalid input(s): POCBNP CBG: Recent Labs  Lab 05/06/24 1456  GLUCAP 120*   D-Dimer No results for input(s): DDIMER in the last 72 hours. Hgb A1c No results for input(s): HGBA1C in the last 72 hours. Lipid Profile No results for input(s): CHOL, HDL, LDLCALC, TRIG, CHOLHDL, LDLDIRECT in the last 72 hours. Thyroid function studies No results for input(s): TSH, T4TOTAL, T3FREE, THYROIDAB in the last 72 hours.  Invalid input(s): FREET3 Anemia work up No results for input(s): VITAMINB12, FOLATE, FERRITIN, TIBC, IRON, RETICCTPCT in the last 72 hours. Urinalysis    Component Value Date/Time   COLORURINE YELLOW 05/06/2024 1545   APPEARANCEUR HAZY (A) 05/06/2024 1545   LABSPEC 1.016 05/06/2024 1545   PHURINE 5.0 05/06/2024 1545   GLUCOSEU NEGATIVE 05/06/2024 1545   HGBUR NEGATIVE 05/06/2024 1545   BILIRUBINUR NEGATIVE 05/06/2024 1545   KETONESUR NEGATIVE 05/06/2024 1545   PROTEINUR 30 (A) 05/06/2024 1545   NITRITE NEGATIVE 05/06/2024 1545   LEUKOCYTESUR NEGATIVE 05/06/2024 1545   Sepsis Labs Recent Labs  Lab 05/06/24 1455 05/07/24 0447 05/09/24 0440  WBC 7.4 5.4 7.4   Microbiology Recent Results (from the past 240 hours)  Resp panel by RT-PCR (RSV, Flu A&B, Covid) Anterior Nasal Swab     Status: None   Collection Time: 05/06/24  2:54 PM   Specimen: Anterior Nasal Swab  Result Value Ref Range Status   SARS Coronavirus 2 by RT PCR NEGATIVE NEGATIVE Final    Comment:  (NOTE) SARS-CoV-2 target nucleic acids are NOT DETECTED.  The SARS-CoV-2 RNA is generally detectable in upper respiratory specimens during the acute phase of infection. The lowest concentration of SARS-CoV-2 viral copies this assay can detect is 138 copies/mL. A negative result does not preclude SARS-Cov-2 infection and should not be used as the sole basis for treatment or other patient management decisions. A negative result may occur with  improper specimen collection/handling, submission of specimen other than nasopharyngeal swab, presence of viral mutation(s) within the areas targeted by this assay, and inadequate number of viral copies(<138 copies/mL). A negative result must be combined with clinical observations, patient history, and epidemiological information. The expected result is Negative.  Fact Sheet for Patients:  bloggercourse.com  Fact Sheet for Healthcare Providers:  seriousbroker.it  This test is no t yet approved or cleared by the [place]  FDA and  has been authorized for detection and/or diagnosis of SARS-CoV-2 by FDA under an Emergency Use Authorization (EUA). This EUA will remain  in effect (meaning this test can be used) for the duration of the COVID-19 declaration under Section 564(b)(1) of the Act, 21 U.S.C.section 360bbb-3(b)(1), unless the authorization is terminated  or revoked sooner.       Influenza A by PCR NEGATIVE NEGATIVE Final   Influenza B by PCR NEGATIVE NEGATIVE Final    Comment: (NOTE) The Xpert Xpress SARS-CoV-2/FLU/RSV plus assay is intended as an aid in the diagnosis of influenza from Nasopharyngeal swab specimens and should not be used as a sole basis for treatment. Nasal washings and aspirates are unacceptable for Xpert Xpress SARS-CoV-2/FLU/RSV testing.  Fact Sheet for Patients: bloggercourse.com  Fact Sheet for Healthcare  Providers: seriousbroker.it  This test is not yet approved or cleared by the [place]  FDA and has been authorized for detection and/or diagnosis of SARS-CoV-2 by FDA under an Emergency Use Authorization (EUA). This EUA will remain in effect (meaning this test can be used) for the duration of the COVID-19 declaration under Section 564(b)(1) of the Act, 21 U.S.C. section 360bbb-3(b)(1), unless the authorization is terminated or revoked.     Resp Syncytial Virus by PCR NEGATIVE NEGATIVE Final    Comment: (NOTE) Fact Sheet for Patients: bloggercourse.com  Fact Sheet for Healthcare Providers: seriousbroker.it  This test is not yet approved or cleared by the [place]  FDA and has been authorized for detection and/or diagnosis of SARS-CoV-2 by FDA under an Emergency Use Authorization (EUA). This EUA will remain in effect (meaning this test can be used) for the duration of the COVID-19 declaration under Section 564(b)(1) of the Act, 21 U.S.C. section 360bbb-3(b)(1), unless the authorization is terminated or revoked.  Performed at [facility], [address], [place], [place] [address]   Culture, blood (Routine x 2)     Status: Abnormal   Collection Time: 05/06/24  2:55 PM   Specimen: BLOOD RIGHT ARM  Result Value Ref Range Status   Specimen Description   Final    BLOOD RIGHT ARM Performed at [facility], [address] [place], [place] [address]    Special Requests   Final    BOTTLES DRAWN AEROBIC AND ANAEROBIC Blood Culture adequate volume Performed at [facility], [address] [place], [place] [address]    Culture  Setup Time   Final    [facility] POSITIVE COCCI  IN BOTH AEROBIC AND ANAEROBIC BOTTLES Gram Stain Report Called to,Read Back By and Verified With: ASHLEY S. ON 05/07/2024 @12 :54PM BY T. HAMER  CRITICAL RESULT CALLED TO, READ BACK BY AND VERIFIED WITH: RN BARI LOVING [id number] AT  1930 BY EC    Culture (A)  Final    STAPHYLOCOCCUS EPIDERMIDIS THE SIGNIFICANCE OF ISOLATING THIS ORGANISM FROM A SINGLE SET OF BLOOD CULTURES WHEN MULTIPLE SETS ARE DRAWN IS UNCERTAIN. PLEASE NOTIFY THE MICROBIOLOGY DEPARTMENT WITHIN ONE WEEK IF SPECIATION AND SENSITIVITIES ARE REQUIRED. Performed at [facility], [address] [place], [place] [address]    Report Status 05/09/2024 FINAL  Final  Blood Culture ID Panel (Reflexed)     Status: Abnormal   Collection Time: 05/06/24  2:55 PM  Result Value Ref Range Status   Enterococcus faecalis NOT DETECTED NOT DETECTED Final   Enterococcus Faecium NOT DETECTED NOT DETECTED Final   Listeria monocytogenes NOT DETECTED NOT DETECTED Final   Staphylococcus species DETECTED (A) NOT DETECTED Final    Comment: CRITICAL RESULT CALLED TO, READ BACK BY AND VERIFIED WITH: RN CHRISTY EDWARDS [id number] AT 1930 BY EC    Staphylococcus aureus (BCID) NOT DETECTED NOT DETECTED Final   Staphylococcus epidermidis DETECTED (A) NOT DETECTED Final    Comment: CRITICAL RESULT CALLED TO, READ BACK BY AND VERIFIED WITH: RN BARI BOHR [id number] AT 1930 BY EC    Staphylococcus lugdunensis NOT DETECTED NOT DETECTED Final   Streptococcus species NOT DETECTED NOT DETECTED Final   Streptococcus agalactiae NOT DETECTED NOT DETECTED Final   Streptococcus pneumoniae NOT DETECTED NOT DETECTED Final   Streptococcus pyogenes NOT DETECTED NOT DETECTED Final   A.calcoaceticus-baumannii NOT DETECTED NOT DETECTED Final   Bacteroides fragilis NOT DETECTED NOT DETECTED Final   Enterobacterales NOT DETECTED NOT DETECTED Final   Enterobacter cloacae complex NOT DETECTED NOT DETECTED Final   Escherichia coli NOT DETECTED NOT DETECTED Final   Klebsiella aerogenes NOT DETECTED NOT DETECTED Final   Klebsiella oxytoca NOT DETECTED NOT DETECTED Final   Klebsiella pneumoniae NOT DETECTED NOT DETECTED Final   Proteus species NOT DETECTED NOT DETECTED Final   Salmonella  species NOT DETECTED NOT DETECTED Final   Serratia marcescens NOT DETECTED NOT DETECTED Final   Haemophilus influenzae NOT DETECTED NOT DETECTED Final   Neisseria meningitidis NOT DETECTED NOT DETECTED Final   Pseudomonas aeruginosa NOT DETECTED NOT DETECTED Final   Stenotrophomonas maltophilia NOT DETECTED NOT DETECTED Final   Candida albicans NOT DETECTED NOT DETECTED Final   Candida auris NOT DETECTED NOT DETECTED Final   Candida glabrata NOT DETECTED NOT DETECTED Final   Candida krusei NOT DETECTED NOT DETECTED Final   Candida parapsilosis NOT DETECTED NOT DETECTED Final   Candida tropicalis NOT DETECTED NOT DETECTED Final   Cryptococcus neoformans/gattii NOT DETECTED NOT DETECTED Final   Methicillin resistance mecA/C NOT DETECTED NOT DETECTED Final    Comment: Performed at [facility], [address], [place], [place] [address]  Culture, blood (Routine x 2)     Status: None (Preliminary result)   Collection Time: 05/06/24  3:01 PM   Specimen: BLOOD  Result Value Ref Range Status   Specimen Description BLOOD BLOOD LEFT ARM AEROBIC BOTTLE ONLY  Final   Special Requests   Final    BOTTLES DRAWN AEROBIC ONLY Blood Culture adequate volume   Culture   Final    NO GROWTH 3 DAYS Performed at [facility], [address] [place], [place] [address]    Report  Status PENDING  Incomplete  MRSA Next Gen by PCR, Nasal     Status: None   Collection Time: 05/06/24 10:12 PM   Specimen: Nasal Mucosa; Nasal Swab  Result Value Ref Range Status   MRSA by PCR Next Gen NOT DETECTED NOT DETECTED Final    Comment: (NOTE) The GeneXpert MRSA Assay (FDA approved for NASAL specimens only), is one component of a comprehensive MRSA colonization surveillance program. It is not intended to diagnose MRSA infection nor to guide or monitor treatment for MRSA infections. Test performance is not FDA approved in patients less than 67 years old. Performed at [facility], [address] [place], [place]  [address]   C Difficile Quick Screen w PCR reflex     Status: None   Collection Time: 05/08/24  1:52 PM   Specimen: STOOL  Result Value Ref Range Status   C Diff antigen NEGATIVE NEGATIVE Final   C Diff toxin NEGATIVE NEGATIVE Final   C Diff interpretation No C. difficile detected.  Final    Comment: Performed at [facility], [address] [place], [place] [address]   Time coordinating discharge: 31 mins  SIGNED:  Afton Louder, MD  [facility] 05/09/2024, 10:46 AM How to contact the [facility] Attending or Consulting provider 7A - 7P or covering provider during after hours 7P -7A, for this patient?  Check the care team in [facility] and look for a) attending/consulting [facility] provider listed and b) the [facility] team listed Log into [email or web address] and use [facility]'s universal password to access. If you do not have the password, please contact the hospital operator. Locate the [facility] provider you are looking for under [facility] and page to a number that you can be directly reached. If you still have difficulty reaching the provider, please page the [facility] (Director on Call) for the Hospitalists listed on amion for assistance.     [1] No Known Allergies

## 2024-05-11 LAB — CULTURE, BLOOD (ROUTINE X 2)
Culture: NO GROWTH
Special Requests: ADEQUATE

## 2024-05-25 ENCOUNTER — Ambulatory Visit: Admitting: Orthopedic Surgery
